# Patient Record
Sex: Female | Born: 1955 | Race: Black or African American | Hispanic: No | Marital: Single | Smoking: Current every day smoker
Health system: Southern US, Community
[De-identification: ages and names within clinical notes are randomized; demographics above are authoritative.]

## PROBLEM LIST (undated history)

## (undated) DIAGNOSIS — E785 Hyperlipidemia, unspecified: Secondary | ICD-10-CM

## (undated) DIAGNOSIS — J45909 Unspecified asthma, uncomplicated: Secondary | ICD-10-CM

## (undated) DIAGNOSIS — Z8042 Family history of malignant neoplasm of prostate: Secondary | ICD-10-CM

## (undated) DIAGNOSIS — I2510 Atherosclerotic heart disease of native coronary artery without angina pectoris: Secondary | ICD-10-CM

## (undated) DIAGNOSIS — Z8051 Family history of malignant neoplasm of kidney: Secondary | ICD-10-CM

## (undated) DIAGNOSIS — G4730 Sleep apnea, unspecified: Secondary | ICD-10-CM

## (undated) DIAGNOSIS — I10 Essential (primary) hypertension: Secondary | ICD-10-CM

## (undated) DIAGNOSIS — J449 Chronic obstructive pulmonary disease, unspecified: Secondary | ICD-10-CM

## (undated) DIAGNOSIS — E119 Type 2 diabetes mellitus without complications: Secondary | ICD-10-CM

## (undated) DIAGNOSIS — Z8041 Family history of malignant neoplasm of ovary: Secondary | ICD-10-CM

## (undated) DIAGNOSIS — I82409 Acute embolism and thrombosis of unspecified deep veins of unspecified lower extremity: Secondary | ICD-10-CM

## (undated) HISTORY — DX: Type 2 diabetes mellitus without complications: E11.9

## (undated) HISTORY — DX: Family history of malignant neoplasm of ovary: Z80.41

## (undated) HISTORY — DX: Family history of malignant neoplasm of prostate: Z80.42

## (undated) HISTORY — DX: Sleep apnea, unspecified: G47.30

## (undated) HISTORY — DX: Family history of malignant neoplasm of kidney: Z80.51

## (undated) HISTORY — DX: Hyperlipidemia, unspecified: E78.5

## (undated) HISTORY — PX: CARDIAC CATHETERIZATION: SHX172

## (undated) HISTORY — PX: TOTAL KNEE ARTHROPLASTY: SHX125

## (undated) HISTORY — PX: ABDOMINAL HYSTERECTOMY: SHX81

## (undated) HISTORY — DX: Acute embolism and thrombosis of unspecified deep veins of unspecified lower extremity: I82.409

## (undated) HISTORY — DX: Chronic obstructive pulmonary disease, unspecified: J44.9

## (undated) HISTORY — DX: Atherosclerotic heart disease of native coronary artery without angina pectoris: I25.10

## (undated) HISTORY — DX: Essential (primary) hypertension: I10

## (undated) HISTORY — PX: KNEE ARTHROSCOPY: SHX127

---

## 1997-10-17 ENCOUNTER — Emergency Department (HOSPITAL_COMMUNITY): Admission: EM | Admit: 1997-10-17 | Discharge: 1997-10-17 | Payer: Self-pay | Admitting: Emergency Medicine

## 2004-10-08 ENCOUNTER — Encounter: Admission: RE | Admit: 2004-10-08 | Discharge: 2004-10-08 | Payer: Self-pay | Admitting: Occupational Medicine

## 2004-11-03 ENCOUNTER — Encounter: Admission: RE | Admit: 2004-11-03 | Discharge: 2004-11-26 | Payer: Self-pay | Admitting: Orthopedic Surgery

## 2005-01-19 ENCOUNTER — Inpatient Hospital Stay (HOSPITAL_COMMUNITY): Admission: EM | Admit: 2005-01-19 | Discharge: 2005-01-22 | Payer: Self-pay | Admitting: Emergency Medicine

## 2005-01-25 ENCOUNTER — Ambulatory Visit: Payer: Self-pay | Admitting: Internal Medicine

## 2005-02-01 ENCOUNTER — Ambulatory Visit: Payer: Self-pay | Admitting: Internal Medicine

## 2005-02-08 ENCOUNTER — Ambulatory Visit: Payer: Self-pay | Admitting: Internal Medicine

## 2005-02-15 ENCOUNTER — Ambulatory Visit: Payer: Self-pay | Admitting: Internal Medicine

## 2005-02-22 ENCOUNTER — Ambulatory Visit: Payer: Self-pay | Admitting: Internal Medicine

## 2005-03-01 ENCOUNTER — Ambulatory Visit: Payer: Self-pay | Admitting: Internal Medicine

## 2005-03-08 ENCOUNTER — Ambulatory Visit: Payer: Self-pay | Admitting: Internal Medicine

## 2005-03-15 ENCOUNTER — Ambulatory Visit: Payer: Self-pay | Admitting: Internal Medicine

## 2005-03-25 ENCOUNTER — Ambulatory Visit: Payer: Self-pay | Admitting: Internal Medicine

## 2005-04-01 ENCOUNTER — Ambulatory Visit: Payer: Self-pay

## 2005-04-07 ENCOUNTER — Ambulatory Visit: Payer: Self-pay

## 2005-04-18 ENCOUNTER — Ambulatory Visit: Payer: Self-pay | Admitting: Internal Medicine

## 2005-05-19 ENCOUNTER — Ambulatory Visit: Payer: Self-pay | Admitting: Internal Medicine

## 2005-05-26 ENCOUNTER — Ambulatory Visit: Payer: Self-pay

## 2005-06-07 ENCOUNTER — Ambulatory Visit: Payer: Self-pay | Admitting: Internal Medicine

## 2005-06-28 ENCOUNTER — Ambulatory Visit: Payer: Self-pay | Admitting: Internal Medicine

## 2005-08-17 ENCOUNTER — Ambulatory Visit: Payer: Self-pay | Admitting: Internal Medicine

## 2005-12-16 ENCOUNTER — Ambulatory Visit: Payer: Self-pay | Admitting: Internal Medicine

## 2005-12-16 ENCOUNTER — Observation Stay (HOSPITAL_COMMUNITY): Admission: AD | Admit: 2005-12-16 | Discharge: 2005-12-17 | Payer: Self-pay | Admitting: Internal Medicine

## 2005-12-17 ENCOUNTER — Ambulatory Visit: Payer: Self-pay | Admitting: Internal Medicine

## 2005-12-21 ENCOUNTER — Ambulatory Visit: Payer: Self-pay | Admitting: Internal Medicine

## 2005-12-27 ENCOUNTER — Ambulatory Visit: Payer: Self-pay | Admitting: Cardiovascular Disease

## 2005-12-29 ENCOUNTER — Inpatient Hospital Stay (HOSPITAL_BASED_OUTPATIENT_CLINIC_OR_DEPARTMENT_OTHER): Admission: RE | Admit: 2005-12-29 | Discharge: 2005-12-29 | Payer: Self-pay | Admitting: Cardiovascular Disease

## 2005-12-29 ENCOUNTER — Ambulatory Visit: Payer: Self-pay | Admitting: Cardiovascular Disease

## 2006-01-05 ENCOUNTER — Ambulatory Visit: Payer: Self-pay | Admitting: Cardiovascular Disease

## 2006-01-19 ENCOUNTER — Ambulatory Visit: Payer: Self-pay | Admitting: Internal Medicine

## 2006-06-13 DIAGNOSIS — R945 Abnormal results of liver function studies: Secondary | ICD-10-CM

## 2006-06-13 DIAGNOSIS — M545 Low back pain: Secondary | ICD-10-CM

## 2006-06-13 DIAGNOSIS — M1990 Unspecified osteoarthritis, unspecified site: Secondary | ICD-10-CM | POA: Insufficient documentation

## 2006-06-13 DIAGNOSIS — F1910 Other psychoactive substance abuse, uncomplicated: Secondary | ICD-10-CM | POA: Insufficient documentation

## 2006-12-25 ENCOUNTER — Ambulatory Visit: Payer: Self-pay | Admitting: Internal Medicine

## 2006-12-25 DIAGNOSIS — E119 Type 2 diabetes mellitus without complications: Secondary | ICD-10-CM | POA: Insufficient documentation

## 2007-01-15 ENCOUNTER — Telehealth (INDEPENDENT_AMBULATORY_CARE_PROVIDER_SITE_OTHER): Payer: Self-pay | Admitting: *Deleted

## 2007-01-18 ENCOUNTER — Encounter: Payer: Self-pay | Admitting: Internal Medicine

## 2007-01-19 ENCOUNTER — Telehealth: Payer: Self-pay | Admitting: Internal Medicine

## 2007-04-13 ENCOUNTER — Ambulatory Visit: Payer: Self-pay | Admitting: Internal Medicine

## 2007-04-13 DIAGNOSIS — K219 Gastro-esophageal reflux disease without esophagitis: Secondary | ICD-10-CM

## 2007-04-13 DIAGNOSIS — J45909 Unspecified asthma, uncomplicated: Secondary | ICD-10-CM

## 2007-04-13 DIAGNOSIS — J439 Emphysema, unspecified: Secondary | ICD-10-CM | POA: Insufficient documentation

## 2007-04-13 DIAGNOSIS — E785 Hyperlipidemia, unspecified: Secondary | ICD-10-CM | POA: Insufficient documentation

## 2007-04-18 LAB — CONVERTED CEMR LAB
Albumin: 3.7 g/dL (ref 3.5–5.2)
Alkaline Phosphatase: 34 units/L — ABNORMAL LOW (ref 39–117)
BUN: 9 mg/dL (ref 6–23)
Basophils Relative: 0.7 % (ref 0.0–1.0)
CO2: 27 meq/L (ref 19–32)
Calcium: 9 mg/dL (ref 8.4–10.5)
Cholesterol: 221 mg/dL (ref 0–200)
Direct LDL: 165 mg/dL
GFR calc non Af Amer: 94 mL/min
HDL: 40.7 mg/dL (ref >39.0)
Hgb A1c MFr Bld: 6.1 % — ABNORMAL HIGH (ref 4.6–6.0)
MCHC: 34.4 g/dL (ref 30.0–36.0)
MCV: 94.1 fL (ref 78.0–100.0)
Microalb Creat Ratio: 4.2 mg/g (ref 0.0–30.0)
Microalb, Ur: 1 mg/dL (ref 0.0–1.9)
Monocytes Absolute: 0.3 10*3/uL (ref 0.2–0.7)
Monocytes Relative: 5.9 % (ref 3.0–11.0)
Neutro Abs: 2.5 10*3/uL (ref 1.4–7.7)
Neutrophils Relative %: 45.8 % (ref 43.0–77.0)
Platelets: 229 10*3/uL (ref 150–400)
Potassium: 3.6 meq/L (ref 3.5–5.1)
RDW: 13.7 % (ref 11.5–14.6)
Sodium: 138 meq/L (ref 135–145)
Total Protein: 6.7 g/dL (ref 6.0–8.3)
VLDL: 16 mg/dL (ref 0–40)

## 2007-05-07 ENCOUNTER — Emergency Department (HOSPITAL_COMMUNITY): Admission: EM | Admit: 2007-05-07 | Discharge: 2007-05-07 | Payer: Self-pay | Admitting: Emergency Medicine

## 2007-05-21 ENCOUNTER — Ambulatory Visit: Payer: Self-pay | Admitting: Family Medicine

## 2007-05-21 DIAGNOSIS — R42 Dizziness and giddiness: Secondary | ICD-10-CM

## 2007-05-21 LAB — CONVERTED CEMR LAB
Glucose, Bld: 237 mg/dL
Troponin I: 0.02 ng/mL (ref <0.06)

## 2007-05-22 ENCOUNTER — Telehealth (INDEPENDENT_AMBULATORY_CARE_PROVIDER_SITE_OTHER): Payer: Self-pay | Admitting: *Deleted

## 2007-05-22 ENCOUNTER — Encounter (INDEPENDENT_AMBULATORY_CARE_PROVIDER_SITE_OTHER): Payer: Self-pay | Admitting: *Deleted

## 2007-05-22 LAB — CONVERTED CEMR LAB
BUN: 14 mg/dL (ref 6–23)
Basophils Relative: 0.2 % (ref 0.0–1.0)
CK-MB: 1.2 ng/mL (ref 0.3–4.0)
Calcium: 9.2 mg/dL (ref 8.4–10.5)
Eosinophils Relative: 2.1 % (ref 0.0–5.0)
GFR calc non Af Amer: 70 mL/min
Glucose, Bld: 93 mg/dL (ref 70–99)
Lymphocytes Relative: 40.7 % (ref 12.0–46.0)
Monocytes Absolute: 0.5 10*3/uL (ref 0.2–0.7)
Neutro Abs: 4.2 10*3/uL (ref 1.4–7.7)
Neutrophils Relative %: 51.4 % (ref 43.0–77.0)
Platelets: 235 10*3/uL (ref 150–400)
Potassium: 3.5 meq/L (ref 3.5–5.1)
RBC: 4.51 M/uL (ref 3.87–5.11)
RDW: 12.5 % (ref 11.5–14.6)
Sodium: 141 meq/L (ref 135–145)
Total CK: 90 units/L (ref 7–177)
WBC: 8.2 10*3/uL (ref 4.5–10.5)

## 2007-05-23 ENCOUNTER — Ambulatory Visit: Payer: Self-pay | Admitting: Internal Medicine

## 2007-05-23 DIAGNOSIS — R079 Chest pain, unspecified: Secondary | ICD-10-CM

## 2007-05-28 LAB — CONVERTED CEMR LAB: Creatinine, Ser: 0.8 mg/dL

## 2007-07-23 ENCOUNTER — Ambulatory Visit: Payer: Self-pay | Admitting: Internal Medicine

## 2007-07-24 ENCOUNTER — Encounter: Payer: Self-pay | Admitting: Internal Medicine

## 2007-07-27 LAB — CONVERTED CEMR LAB
AST: 23 units/L (ref 0–37)
Cholesterol: 150 mg/dL (ref 0–200)
Hgb A1c MFr Bld: 6.1 % — ABNORMAL HIGH (ref 4.6–6.0)
LDL Cholesterol: 91 mg/dL (ref 0–99)
Total CHOL/HDL Ratio: 3.7
VLDL: 18 mg/dL (ref 0–40)

## 2007-07-30 ENCOUNTER — Encounter (INDEPENDENT_AMBULATORY_CARE_PROVIDER_SITE_OTHER): Payer: Self-pay | Admitting: *Deleted

## 2007-08-10 ENCOUNTER — Ambulatory Visit: Payer: Self-pay | Admitting: Gastroenterology

## 2007-08-22 ENCOUNTER — Encounter: Payer: Self-pay | Admitting: Internal Medicine

## 2007-08-22 ENCOUNTER — Ambulatory Visit: Payer: Self-pay | Admitting: Gastroenterology

## 2007-08-29 ENCOUNTER — Encounter: Admission: RE | Admit: 2007-08-29 | Discharge: 2007-09-06 | Payer: Self-pay | Admitting: Orthopedic Surgery

## 2007-09-04 ENCOUNTER — Inpatient Hospital Stay (HOSPITAL_COMMUNITY): Admission: RE | Admit: 2007-09-04 | Discharge: 2007-09-07 | Payer: Self-pay | Admitting: Orthopedic Surgery

## 2007-10-09 ENCOUNTER — Encounter: Admission: RE | Admit: 2007-10-09 | Discharge: 2007-12-05 | Payer: Self-pay | Admitting: Orthopedic Surgery

## 2007-11-26 ENCOUNTER — Inpatient Hospital Stay (HOSPITAL_COMMUNITY): Admission: RE | Admit: 2007-11-26 | Discharge: 2007-11-29 | Payer: Self-pay | Admitting: Orthopedic Surgery

## 2007-12-24 ENCOUNTER — Encounter: Admission: RE | Admit: 2007-12-24 | Discharge: 2008-02-18 | Payer: Self-pay | Admitting: Specialist

## 2008-05-29 ENCOUNTER — Telehealth (INDEPENDENT_AMBULATORY_CARE_PROVIDER_SITE_OTHER): Payer: Self-pay | Admitting: *Deleted

## 2008-05-30 ENCOUNTER — Encounter: Payer: Self-pay | Admitting: Internal Medicine

## 2008-06-23 ENCOUNTER — Encounter: Admission: RE | Admit: 2008-06-23 | Discharge: 2008-08-12 | Payer: Self-pay | Admitting: Orthopedic Surgery

## 2008-07-08 ENCOUNTER — Encounter (INDEPENDENT_AMBULATORY_CARE_PROVIDER_SITE_OTHER): Payer: Self-pay | Admitting: *Deleted

## 2009-06-30 ENCOUNTER — Ambulatory Visit (HOSPITAL_BASED_OUTPATIENT_CLINIC_OR_DEPARTMENT_OTHER): Admission: RE | Admit: 2009-06-30 | Discharge: 2009-06-30 | Payer: Self-pay | Admitting: Internal Medicine

## 2009-06-30 ENCOUNTER — Ambulatory Visit: Payer: Self-pay | Admitting: Diagnostic Radiology

## 2010-05-30 ENCOUNTER — Encounter: Payer: Self-pay | Admitting: Internal Medicine

## 2010-09-21 NOTE — H&P (Signed)
NAMEJODEE, [name]   MEDICAL RECORD NO.:  [id number]          PATIENT TYPE:  INP   LOCATION:                               FACILITY:  [facility]   PHYSICIAN:  Mary Frankel. Charlann [name], M.D.  DATE OF BIRTH:  09-15-1955   DATE OF ADMISSION:  09/04/2007  DATE OF DISCHARGE:                              HISTORY & PHYSICAL   OPERATION/PROCEDURE:  Right total knee arthroplasty.   CHIEF COMPLAINTS:  Right knee pain.   HISTORY OF PRESENT ILLNESS:  A 55 year old female with a history of  right knee pain secondary to osteoarthritis.  She has been refractory to  all conservative treatment including oral anti-inflammatories and  cortisone injections.  She also has a significant history of spontaneous  DVT in 2007 without previous surgery.  Since then she has been on some  Coumadin, recently been transitioned off aspirin.  She had been  presurgically assessed by Dr. Drue [name] for a right total knee replacement.   PAST MEDICAL HISTORY:  1. Osteoarthritis.  2. Anxiety/depression.  3. Asthma.  4. Hypercholesterolemia.  5. Reflux disease.  6. Hepatitis, unknown what type.  7. Diabetes.  8. History of DVT in 2007.  9. Degenerative disk disease.   PAST SURGERIES:  1. Hysterectomy in 1999.  2. Arthroscopic surgery of her knees in 1990 and 1996.   FAMILY HISTORY:  Heart attack, thyroid disease, multiple family members  with blood clots.   SOCIAL HISTORY:  The patient is single, has a partner who will help her  with caregiving after surgery.   ALLERGIES:  NO KNOWN DRUG ALLERGIES.   CURRENT MEDICATIONS:  1. Fentanyl 50 mcg q.72 h.  2. Cymbalta 60 mg p.o. daily.  3. Vicodin 5/500 up to three times daily.  4. Robaxin 500 mg one p.o. up to three times a day.  5. Celebrex 200 mg p.o. daily.  At time of surgery will be taking 200      mg p.o. b.i.d. and then resume back to one a day.  6. Crestor 20 mg one p.o. daily.  7. Prevacid 30 mg one p.o. b.i.d.  8. Janumet 5/500  two times a day.   REVIEW OF SYSTEMS:  NEUROLOGY/ENT:  She does have a history of addiction  and drug abuse, is on a pain contract with Dr. Ethelene [name] who has cleared her  use of pain medicines at time of surgery.  CARDIOVASCULAR:  Does have a  history of phlebitis.  GENITOURINARY:  Has some urinary incontinence  issues and had kidney stones before.  Otherwise see HPI.   PHYSICAL EXAMINATION:  VITAL SIGNS:  Pulse 72, respirations 18, blood  pressure 140/80.  GENERAL:  Awake, alert and oriented, well-developed, well-nourished, no  acute distress.  Does use a single point cane.  NECK:  Supple.  No carotid bruits.  CHEST/LUNGS:  Clear to auscultation bilaterally.  BREASTS:  Deferred.  HEART:  Regular rate and rhythm.  S1 and S2 distinct.  ABDOMEN:  Soft, nontender, bowel sounds present.  GENITOURINARY:  Deferred.  EXTREMITIES:  Right knee slight varus deformity.  She comes out to full  extension.  Dorsalis pedis pulse positive.  SKIN:  No cellulitis.  NEUROLOGIC:  Intact distal sensibilities.   LABORATORY DATA:  Labs, EKG, chest x-ray all pending presurgical  testing.   IMPRESSION:  Right knee osteoarthritis.   PLAN:  Right total knee arthroplasty at [facility], September 04, 2007, by surgeon Dr. Durene [name].  Risks and complications were  discussed.  Questions were encouraged, answered and reviewed.   Postoperative medications were provided.  Dr. Drue [name] has requested that she  be placed on Coumadin postoperatively for DVT prophylaxis.  She does  have a history of DVT.  I will review this in her surgery clearance as  well as with her at time of stay in the hospital.  At minimal we will  use Lovenox for bridging if we do not fully utilize Lovenox for DVT  prophylaxis plus aspirin.     ______________________________  Mary Glassman. Loreta [name], [place]      Mary Frankel. Charlann [name], M.D.  Electronically Signed    BLM/MEDQ  D:  08/23/2007  T:  08/23/2007  Job:  [id number]   cc:   Mary Ora, MD   [phone] [place], [place] [address]   [name]. [name], M.D.  Fax: [phone]

## 2010-09-21 NOTE — Discharge Summary (Signed)
[name], [name]   MEDICAL RECORD NO.:  [id number]          PATIENT TYPE:  INP   LOCATION:  1614                         FACILITY:  [facility]   PHYSICIAN:  Madlyn Frankel. Charlann Boxer, M.D.  DATE OF BIRTH:  1956/03/28   DATE OF ADMISSION:  09/04/2007  DATE OF DISCHARGE:  09/07/2007                               DISCHARGE SUMMARY   ADMITTING DIAGNOSES:  1. Osteoarthritis.  2. Anxiety/depression.  3. Asthma.  4. Hypercholesteremia.  5. Reflux disease.  6. Hepatitis, unknown type.  7. Diabetes.  8. History of DVT in 2007.  9. Degenerative disk disease.   DISCHARGE DIAGNOSES:  1. Osteoarthritis.  2. Anxiety/depression.  3. Asthma.  4. Hypercholesteremia.  5. Reflux disease.  6. Hepatitis, unknown type.  7. Diabetes.  8. History of DVT in 2007.  9 . Degenerative disk disease.   HISTORY OF PRESENT ILLNESS:  A 55 year old female with a history of  right knee pain secondary to osteoarthritis.  Refractory to all  conservative treatment.  Has a history of spontaneous DVT in 2007  without previous surgery.  She had been on some Coumadin and  transitioned to aspirin.  Her primary care physician, Dr. Drue Novel.   CONSULTATION:  None.   PROCEDURE:  Right total knee arthroplasty by surgeon Dr. Durene Romans.  Assistant [employer] PA-C.   LABORATORY DATA:  Preadmission CBC:  Hemoglobin 12.8, hematocrit 37.7,  platelets 165.  At time of discharge, hemoglobin 9.8, hematocrit 28.4,  platelets 135.  White cell differential normal.  Coagulation all within  normal limits.  Routine chemistry on admission:  Sodium 139, potassium  3.6, glucose 115, creatinine 0.68.  At time of discharge, 138 sodium,  3.8 potassium, glucose 119, creatinine 0.62.  Calcium was 8.2.  UA was  negative.   CARDIOLOGY:  EKG normal sinus rhythm.   RADIOLOGY:  No chest x-ray found on chart.  She did have a presurgical  clearance by [facility] prior to surgery.   HOSPITAL COURSE:  The  patient underwent right total knee replacement and  tolerated procedure well and was admitted to the orthopedic floor.  She  remained hemodynamically stable throughout course of stay.  We pulled  Hemovac on day #1, had some oozing from the portal, dressing was  reinforced.  She was neurovascularly intact throughout her course of  stay, was able to do straight leg raise after day #1.  She was  weightbearing as tolerated.  Made excellent progress with physical  therapy.  She remained afebrile with no complications during her rehab  in the hospital.  On day #3, we had had her on Lovenox for  anticoagulation.  However, she wanted to be on Coumadin when she was  discharged home, so we started her on Coumadin day #3 with some Lovenox  bridge for 3 days until therapeutic with INR greater than 1.6.  Otherwise, she was stable and ready for discharge with home health care  PT.   DISCHARGE DISPOSITION:  Discharged home with home health care PT.   CONDITION ON DISCHARGE:  She is stable in improved condition.   DISCHARGE  INSTRUCTIONS:  1. Discharge diet:  Regular diabetic.  2. Discharge wound care:  Keep wound dry.  3. Discharge physical therapy:  Weightbearing as tolerated with the      use of rolling walker.   DISCHARGE MEDICATIONS:  1. Coumadin 5 mg 2 tablets daily at 6:00 p.m. starting on May 2.  2. Lovenox 40 mg subcu q. 24 times 3 days until INR therapeutic,      greater than 1.8.  3. Robaxin 500 mg one p.o. q. 6.  4. Vicodin 5/325 one to two p.o. q. 4-6 p.r.n. pain.  5. Iron 325 mg one p.o. t.i.d. x2 weeks.  6. Colace 100 mg p.o. b.i.d.  7. MiraLax 70 grams p.o. daily.  8. Cymbalta 60 mg p.o. q.a.m.  9. Fentanyl patch 50 mcg q.72 h.  10.Celebrex 200 mg p.o. b.i.d. x2 weeks.  11.Prevacid 30 mg p.o. b.i.d.  12.Crestor 20 mg p.o. every evening.  13.Janumet 50/500 twice daily.  14.Aspirin 325 mg, hold until complete with Coumadin times 6 weeks.  15.Multivitamin daily.  __________.    DISCHARGE FOLLOWUP:  Follow with Dr. Charlann Boxer at phone number [phone] in 10-  14 days.    ______________________________  [name] [name], [place]      Madlyn Frankel. Charlann Boxer, M.D.  Electronically Signed   BLM/MEDQ  D:  10/29/2007  T:  10/29/2007  Job:  [id number]

## 2010-09-21 NOTE — Op Note (Signed)
NAMELOUCILE, POSNER             ACCOUNT NO.:  [id number]   MEDICAL RECORD NO.:  [id number]          PATIENT TYPE:  INP   LOCATION:  0003                         FACILITY:  [facility]   PHYSICIAN:  Madlyn Frankel. Charlann Boxer, M.D.  DATE OF BIRTH:  Aug 19, 1955   DATE OF PROCEDURE:  09/04/2007  DATE OF DISCHARGE:                               OPERATIVE REPORT   PREOPERATIVE DIAGNOSIS:  Right knee osteoarthritis.   POSTOPERATIVE DIAGNOSIS:  Right knee osteoarthritis.   PROCEDURE:  Right total hip right total knee replacement.   COMPONENTS USED:  DePuy rotating platform posterior stabilized knee  system; with a size 4 narrow femur, size 3 tibial tray, a 12.5 insert  and a 38 patellar button.   SURGEON:  Madlyn Frankel. Charlann Boxer, M.D.   ASSISTANT:  Yetta Glassman. Mann, PA   ANESTHESIA:  Dermal with spinal.   TOURNIQUET TIME:  43 minutes at 250 mmHg.   DRAINS:  Times one.   COMPLICATIONS:  None.   INDICATIONS FOR PROCEDURE:  Ms. Bansal is a 55 year old female who I  had been following in the office for advanced bilateral knee  osteoarthritis.  She had at this point failed conservative measures and  at this point wished to proceed with knee replacement surgery.  We  reviewed the extensive risks of infection, DVT, component failure, need  for revision surgery at her age; as well as the postoperative course and  expectations due to her size and pain level.  Consent was obtained for a  total knee replacement for the benefit of pain relief.  Consent was  obtained.   PROCEDURE IN DETAIL:  The patient was brought to the operative theater.  Once adequate anesthesia and preoperative antibiotics, with Ancef  administered, the patient was positioned supine with a proximal thigh  tourniquet placed.  The right lower extremity was then prescrubbed and  prepped and draped in a sterile fashion.  A midline incision was made,  followed by a median arthrotomy.  Following the initial debridement,  attention was  directed to the patella.  Precut measurement was 24 mm.  I  resected down to 13-14 mm and used a 38 patellar button.  I placed a  metal shim to protect the cut surface of the patella from the  retractors.   Following the partial meniscectomy and cruciate stump debridement, I  attended to the femur.  The femoral canal was opened, with an anterior  starting hole and irrigating the canal to prevent fat emboli.  I then  passed the intramedullary rod and at 5 degrees of valgus resected 10 mm  of bone off the distal femur.  I sized the femur to be a size 4 in the  anterior-posterior dimension; however it was more narrowed for a female.  It was for this reason I chose to use the 4 narrow.  I went ahead and  based off the posterior condylar axis, pinned the block in place (which  matched the perpendicular the [employer]).  The anterior, posterior  and chamfer cuts were then made.  Following this, I made a box cut based  off the lateral  aspect of the distal femur with the size 3 box cut.   Following this, attention was now directed to the tibia.  The tibia was  subluxated anteriorly.  The remaining meniscus and cruciate stumps were  debrided.  I resected 10 mm of bone off the proximal lateral tibia at  the high side.   Once I made this cut, I checked the cut surface; after debriding the  osteophytes off the medial and proximal tibia, in addition to the medial  and distal femur osteophytes.   The cut surfaces of the tibia would best fit with size 3.  This allowed  for proper orientation without impingement posterior or lateral.  The  tendon was blocked into position and checked for alignment of the rod,  and it was perpendicular in both planes.  I went ahead and drilled and  keel punched this tray, and did a trial reduction with the 4 narrow  femur, the 3 tibia and a 12.5 insert.  The knee was stable from  extension to flexion with full extension.  The patella tracked without  application  of any pressure.  At this point, all trial components were  removed.  I drilled some smooth pin holes into the sclerotic bone on the  proximal tibia and distal femur.  I then injected the synovial capsule  layer with 60 mL of 0.25% Marcaine with epinephrine, and 1 mL of  Toradol.  The knee was irrigated with normal saline solution as the  final components were opened, and then cement mixed.  The components  were cemented into position, with the knee brought up to extension with  the 12.5 insert.  Extruded cement was removed.   Once the cement had been removed and cured, the knee was brought to  flexion and trial component removed.  Once this cement was finished, I  debrided the remaining cement.  A medium Hemovac drain was placed deep.  I reirrigated the knee at this point.  We then used the #2 Quill suture  to reapproximate the extensor mechanism in flexion.  I then used 2-0  Vicryl in the subcutaneous layer, and a running 4-0 Monocryl.  The knee  was cleaned, dried and dressed sterilely with a sterile bulky Jones  dressing.  She was brought to the recovery room in stable condition,  tolerating the procedure well.      Madlyn Frankel Charlann Boxer, M.D.  Electronically Signed     MDO/MEDQ  D:  09/04/2007  T:  09/04/2007  Job:  [id number]

## 2010-09-21 NOTE — H&P (Signed)
[name], [name]             ACCOUNT NO.:  [id number]   MEDICAL RECORD NO.:  [id number]         PATIENT TYPE:  LINP   LOCATION:                               FACILITY:  [facility]   PHYSICIAN:  [name], M.D.  DATE OF BIRTH:  Apr 22, 1956   DATE OF ADMISSION:  11/26/2007  DATE OF DISCHARGE:                              HISTORY & PHYSICAL   PROCEDURE:  Left total knee arthroplasty.   CHIEF COMPLAINT:  Left knee pain.   HISTORY OF PRESENT ILLNESS:  This 55 year old female with a history of  left knee pain secondary to osteoarthritis.  It was not refractory to  all conservative treatment including oral anti-inflammatories and  cortisone injections.  She does have a recent history of a right total  knee replacement in April of 2009 by [name] and she has done extremely  well with this.  She had previously been assessed by [name] for knee  replacement surgery and was cleared.  In addition, she did have a  history of spontaneous DVT in 2007, had previously been on Coumadin, and  did utilize Coumadin after her first surgery for the right knee.   PAST MEDICAL HISTORY:  Past medical history is significant for:  1. Osteoarthritis.  2. Anxiety and depression.  3. Asthma.  4. Hypercholesteremia.  5. Reflux disease.  6. Hepatitis, unknown what type.  7. Diabetes.  8. History of DVT in 2007.  9. Degenerative disk disease.   PAST SURGERIES:  1. Hysterectomy 1999.  2. Arthroscopic surgery of her knees in 1990 and 1996 and right total      knee replacement in April of 2009.   FAMILY HISTORY:  1. Heart attack.  2. Thyroid disease.  3. Multiple family members with blood clots.   SOCIAL HISTORY:  The patient is single.  Her primary caregiver after  surgery was her mother after last surgery.   ALLERGIES:  NO KNOWN DRUG ALLERGIES.   MEDICATIONS:  1. Atenolol 50 mg p.o. daily.  2. Fentanyl 50 mcg q.72 hours.  3. Cymbalta 60 mg p.o. daily.  4. Vicodin 5/500 up to three times  daily.  5. Robaxin 500 mg 1 p.o. up to three times daily.  6. Celebrex 200 mg p.o. daily.  At the time of surgery she will be      taking 200 mg p.o. b.i.d. and then resume back to one a day after 2      weeks.  7. Crestor 20 mg 1 p.o. daily.  8. Prevacid 30 mg one p.o. b.i.d.  9. Janumet 5/500 two times a day.   REVIEW OF SYSTEMS:  GENERAL: She has night sweats and fatigue.  HEENT:  __________  she has some issues with headaches and blurred vision,  dizziness, some insomnia, imbalance problems.  She does wear dentures.  RESPIRATORY: She has shortness of breath at rest and shortness of breath  on exertion with some wheezing.  CARDIOVASCULAR: She is noted some  increased swelling.  GI: She has had jaundice at the time as well as  heartburn.  GENITOURINARY: She has some painful urination and increased  urination at night.  MUSCULOSKELETAL:  Multiple joint pains, multiple  musculoskeletal pains with spasms.  NEUROLOGY:  She does have a history  of addiction and drug abuse and is on a pain contract with [name] who  has cleared for use of pain medicines at time of surgery which we did  with the right knee in the past, otherwise see HPI.   PHYSICAL EXAMINATION:  Pulse 72, respirations 16, blood pressure 138/76.  GENERAL:  Awake, alert and oriented, well-developed, well-nourished, in  no acute distress.  NECK: Supple.  No carotid bruits.  CHEST/LUNGS:  Clear to auscultation bilaterally.  BREASTS:  Deferred.  HEART:  Regular rate and rhythm.  S1-S2 distinct.  ABDOMEN:  Soft, nontender, bowel sounds present.  GENITOURINARY:  Deferred.  EXTREMITIES:  The left knee has slight varus deformity.  She does come  out to full extension.  Dorsalis pedis pulse positive.  SKIN:  No cellulitis.  NEUROLOGIC:  Intact distal sensibilities.   LABORATORY DATA:  Labs, EKG, chest x-ray all pending presurgical  testing.   IMPRESSION:  Left knee osteoarthritis.   PLAN:  Left total knee arthroplasty at  [facility], November 26, 2007 by Surgeon Dr. Durene Romans.  Risks and complications were  discussed.   Postoperative medications were provided which included Robaxin, iron,  Colace and MiraLax.  Pain medicine will be provided at the time of  surgery.  We are planning on the use of Coumadin for DVT prophylaxis  postoperatively.     ______________________________  [name], [place]      [name], M.D.  Electronically Signed    BLM/MEDQ  D:  11/19/2007  T:  11/19/2007  Job:  [id number]   cc:   Mary Ora, MD  [phone] [address] [place], [place] [address]

## 2010-09-21 NOTE — Assessment & Plan Note (Signed)
[facility]                                 ON-CALL NOTE   NAME:WILLIAMSEnza, Shone                      MRN:          [id number]  DATE:05/21/2007                            DOB:          12-23-1955    PATIENT:  [name].   DATE AND TIME:  May 21, 2007, at 5:26 p.m.   DATE OF BIRTH:  11/18/1955.   PHONE:  Is [phone].   CALLER:  Angie from [facility].   PRIMARY CARE PHYSICIAN:  Dr. Leanne Chang.   TELEPHONE CALL:  The call is about stat labs.  She had a troponin drawn  with the result being negative at 0.02, which is within normal range  with no evidence of myocardial injury.  This lab did get posted for Dr.  Blossom Hoops to read in the morning.     Marne A. Tower, MD  Electronically Signed    MAT/MedQ  DD: 05/21/2007  DT: 05/22/2007  Job #: [id number]

## 2010-09-21 NOTE — Op Note (Signed)
NAMENORETTA, FRIER             ACCOUNT NO.:  [id number]   MEDICAL RECORD NO.:  [id number]          PATIENT TYPE:  INP   LOCATION:  0004                         FACILITY:  [facility]   PHYSICIAN:  Madlyn Frankel. Charlann Boxer, M.D.  DATE OF BIRTH:  09/26/1955   DATE OF PROCEDURE:  11/26/2007  DATE OF DISCHARGE:                               OPERATIVE REPORT   PREOPERATIVE DIAGNOSES:  1. Left knee end-stage osteoarthritis.  2. History of right total knee replacement.   POSTOPERATIVE DIAGNOSIS:  1. Left knee end-stage osteoarthritis.  2. History of right total knee replacement.   PROCEDURE:  Left total knee replacement.   COMPONENTS USED:  DePuy rotating platform posterior stabilized knee  system, size 3 femur, 3 tibia, 12.5 insert and a 38 patellar button.   SURGEON:  Madlyn Frankel. Charlann Boxer, M.D.   ASSISTANT:  [name], PA   ANESTHESIA:  Duramorph, spinal.   COMPLICATIONS:  None.   DRAINS:  Times one.   TOURNIQUET TIME:  45 minutes at 300 mmHg.   INDICATION FOR PROCEDURE:  Ms. Mcquaig is  55 year old female patient  with a history of right total knee replacement and bilateral end-stage  degenerative changes.  She has done very well with the right knee and  was ready to have the left one done.  We reviewed the risks and benefits  of this procedure, and hospital course, expectation.  Consent was  obtained.   PROCEDURE IN DETAIL:  The patient was brought to the operating theater.  Once adequate anesthesia, preoperative antibiotics, 2 g of Ancef,  administered.  The patient was positioned supine, thigh tourniquet  placed.  The left lower extremity was pre scrubbed and prepped and  draped in sterile fashion.  The leg was exsanguinated, tourniquet  elevated.  The midline incision was made and followed by an arthrotomy.  Following initial debridement, I attended to the patella first.  The  precut measurement was 24 mm.  I resected down to 14 mm and used a 38  patellar button, as I had  done on the contralateral knee.  Lug holes  were drilled.  The left of the trial component was placed to protect the  cut surface of the patella from retractors, as well as the saw blades.   At this point, following further debridement, I was able sublux the  tibia anteriorly.  With this I attended now to the femur.  The femoral  canal was opened with a drill, irrigated to prevent fat emboli.  I then  passed the intramedullary rod and resected 11 mm of bone off the distal  femur.  There was a slight bit of osteophyte medially, justifying the 11  mm cut.   I set this at 5 degrees of the valgus.   The anterior of this distal cut was made and I sized the femur at this  point to be a size 3.  It was actually between a size 3 and 4, but I  chose the size 3 on this side, as it seemed closer to a size 3.  Then I  attended to the tibia.  The  tibia was subluxated anteriorly.  With the  extramedullary rod, perpendicular shaft with the tibia, I resected 8 mm  bone off the proximal lateral tibia.  Following this bone cut, I checked  the cut to make sure it was perpendicular in both planes, and then made  some adjustments, removing osteophytes and shaving down some bone as  necessary to make sure the cut was perpendicular.   With this cut made, now I attended back to the femur.  The cut surface  of tibia was going to help with the rotation of femoral component.   With the 5 degree rod in place, I placed the anterior stylus on the  anterior shaft of the tibia and locked it in place.  I reset the  rotation based on the proximal patellar and the tibia, using the  appropriate shim.  Once the rotation was held in position, drill holes  were removed.  I then placed a size 3 cutting block.  I checked with a  crab claw and found that there was going to be no notching.  At this  point the anterior, posterior and chamfer cuts were all made without  difficulty and no notching.   I went ahead and made the  final box cut off the lateral aspect of the  distal femur.  The final tibial preparation was now carried out with  tibia  subluxated anteriorly.  I held the size 3 tibial tray in place,  pinned it, drilled and did the keel punch.  Trial reduction was carried  out briefly with the tibia and a 10 mm insert initially, and then a  12.5.  With the 10 mm insert site, I thought there was a slight bit of  hyperextension, and thus I used the 12.5.  The ligaments appeared to be  very stable from extension to flexion with the patella tracking without  application of any pressure.   I then loosened bone osteophytes off the posterior, medial and lateral  aspect of the femur.  At this point all trial components were removed.  We had drilled some holes into the lateral and proximal tibia, irrigated  the wound with normal saline solution, pulse lavage, removing any debris  as necessary.  The synovial capsular junction was then injected with  0.75% Marcaine with epinephrine and 1 mL of Toradol for a total of 60  mL.  Cement was mixed and the final components were opened.   The final components were cemented into position.  The knee was brought  to extension with a 12.5 insert.  Once extruded cement was removed, the  remaining cement cured.  I removed the remaining cement.  Once I was  satisfied that I was unable to visualize any other cement within the  soft tissues, a final size 3 x 12.5 mm insert was inserted.  The knee  was reduced, irrigated again with pulse lavage with medium Hemovac drain  placed.  Thigh tourniquet was let down.  The extensor mechanism was then  reapproximated using #1 Vicryl with the knee in flexion.  The remainder  of the wound was closed in layers of 2-0 Vicryl running and with 4-0  Monocryl.  The wound was cleaned, dried and dressed sterilely with Steri-  Strips and a bulky sterile dressing.  She was brought to the recovery  room in stable condition, tolerating the procedure  well.     Madlyn Frankel Charlann Boxer, M.D.  Electronically Signed    MDO/MEDQ  D:  11/26/2007  T:  11/26/2007  Job:  [id number]

## 2010-09-21 NOTE — Discharge Summary (Signed)
NAMEJAQUELYNE, FIRKUS             ACCOUNT NO.:  [id number]   MEDICAL RECORD NO.:  [id number]          PATIENT TYPE:  INP   LOCATION:  1619                         FACILITY:  [facility]   PHYSICIAN:  Madlyn Frankel. Charlann Boxer, M.D.  DATE OF BIRTH:  Aug 10, 1955   DATE OF ADMISSION:  11/26/2007  DATE OF DISCHARGE:  11/29/2007                               DISCHARGE SUMMARY   ADMISSION DIAGNOSES:  1. Osteoarthritis.  2. Anxiety/depression.  3. Asthma.  4. Hypercholesteremia.  5. Reflux.  6. Hepatitis.  7. Diabetes.  8. History of DVT in 2007.  9. Degenerative disk disease.   DISCHARGE DIAGNOSES:  1. Osteoarthritis.  2. Anxiety/depression.  3. Asthma.  4. Hypercholesteremia.  5. Reflux disease.  6. Hepatitis.  7. Diabetes.  8. History of DVT in 2007.  9. Degenerative disk disease.   HISTORY OF PRESENT ILLNESS:  A 55 year old female with a history of left  knee pain secondary to osteoarthritis was refractory to all conservative  treatment including oral anti-inflammatories and cortisone injection.  She does have a history of right total knee replacement in April 2009,  by Dr. Charlann Boxer and did well.   CONSULTATION:  None.   PROCEDURE:  A left total knee arthroplasty by surgeon, Dr. Durene Romans.  Assistant, Dwyane Luo PA-C.   CARDIOLOGY:  Previous EKG showed normal sinus rhythm.   RADIOLOGY:  No chest x-ray found on chart.   LABORATORY DATA:  CBC upon admission all normal.  At time of discharge,  hemoglobin 10.3, hematocrit 30.4, platelets 168.  White cell  differential no significant abnormalities.  Coagulation INR 1 at time of  discharge.  Routine chemistry upon admission all within normal limits.  At time of discharge, sodium 138, potassium 3.6, glucose 100, creatinine  0.66.  Kidney function normal, GFR calcium was 8.3 discharge.  UA was  negative.   HOSPITAL COURSE:  The patient underwent left total knee replacement and  admitted to orthopedic floor.  She remained hemodynamically  stable,  afebrile throughout her course of stay.  Hemovac was discontinued on day  #1, dressing was changed, no significant drainage or sign of infection  from the wound at any point in time during her course of stay.  Physical  therapy, occupational therapy.  She was weightbearing as tolerated.  Met  all functional criteria for discharge.  Home health care PT prior to  discharge.  Seen on day #3 and was doing well, ready for discharge home.   DISCHARGE DISPOSITION:  Discharged home with home health care PT,  stable, improved condition.   DISCHARGE DIET:  Regular as tolerated by the patient.  The patient is  diabetic.   DISCHARGE WOUND CARE:  Keep wound dry.   DISCHARGE PHYSICAL THERAPY:  Weightbearing as tolerated with use of a  rolling walker.   DISCHARGE MEDICATIONS:  1. Lovenox 40 mg subcu q.24 h x11 days.  2. Robaxin 500 mg p.o. q.6 h.  3. Enteric-coated aspirin 325 mg p.o. daily.  4. Vicodin 5/325 one-two p.o. q.4-6 h p.r.n. pain.  5. Iron 325 mg one p.o. t.i.d. x1 week.  6. Colace 100 mg p.o.  b.i.d.  7. MiraLax 17 grams p.o. daily.  8. Celebrex 200 mg one p.o. daily.  9. Cymbalta 60 mg p.o. daily.  10.Crestor 20 mg p.o. daily.  11.Prevacid 30 mg p.o. b.i.d.  12.Janumet twice daily.  13.Fentanyl 32 pack 50 mcg patch q.72 h.  14.Atenolol 50 mg p.o. daily.   DISCHARGE FOLLOW UP:  Follow up with Dr. Charlann Boxer at phone number [phone]  in 2 weeks for wound check.     ______________________________  [name]. Loreta Ave, [place]      Madlyn Frankel. Charlann Boxer, M.D.  Electronically Signed    BLM/MEDQ  D:  12/25/2007  T:  12/25/2007  Job:  [id number]   cc:   [name], MD  [phone] [address] [place], [place] [address]

## 2010-09-24 NOTE — Letter (Signed)
January 05, 2006     [name], M.D.  [phone] [place], [place] [address]   RE:  Mary, [name]  MRN:  [id number]  /  DOB:  01/03/1956   Dear Dr. Drue Novel,   It was my pleasure to see [name] in followup after her cardiac  catheterization in the cardiology clinic this morning.  As you know, she is  a very pleasant 55 year old African-American woman who presented with  ongoing chest pain and indeterminate noninvasive imaging study.  Due to her  ongoing symptoms and difficulty with performing noninvasive studies  secondary to her body habitus, I elected to perform a cardiac  catheterization.  Study was done on December 29, 2005 and demonstrated left  ventricular function and mild nonobstructive coronary artery disease.   She returns for followup today and has been doing well.  She reports no new  complaints at this time.   On physical examination today weight is 291 pounds, blood pressure is 128/80  and heart rate is 86.   Her exam shows a normal right brachial pulse.  Her right radial pulse is  diminished at 1+ and there is some local ecchymosis in this region.  The  hand is warm and appears well perfused.  Her right groin site was examined  as well and there is no ecchymosis or hematoma in this region.  There is no  tenderness there.  Her distal pulses in the feet are intact.   ASSESSMENT:  1. Nonobstructive coronary artery disease.  2. Type 2 diabetes currently managed with diet.  3. History of hyperlipidemia.  4. Tobacco abuse.   I have reviewed her cath findings with her and discussed the importance of  risk factor modification.  We discussed smoking cessation at length. She has  been unable to qualify for payment of Chantix but will continue to try to  stop smoking.  Regarding her lipid status, I was unable to find a copy of  her lipids but she reports taking Lipitor in the past and discontinuing this  secondary  to difficulty paying for the medication.  I will  leave her lipid  therapy to your discretion but would recommend an LDL goal of less than 100  in her case.   [name] will resume her  regular followup with you.  I want to thank  you again for the opportunity to evaluate her.  Please feel free to contact  me at any time with questions regarding her care.    Sincerely,      Micheline Chapman, MD   MDC/MedQ  DD:  01/05/2006  DT:  01/05/2006  Job #:  [id number]

## 2010-09-24 NOTE — Assessment & Plan Note (Signed)
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   CHIEF COMPLAINT:  Chest pain and tightness.   HISTORY OF PRESENT ILLNESS:  [name] is a very pleasant 55 year old  woman who presents with ongoing intermittent chest discomfort.  She reports  a two month history of intermittent chest tightness that generally starts  abruptly and waxes and wanes over a number of minutes.  Her chest tightness  is unrelated to exertion.  She was evaluated earlier this month and admitted  to [facility] where she was worked up for a possible pulmonary  embolus and her CT angiogram was negative.  This was done because of her  symptoms as well as her history of deep vein thrombosis last year.  She also  had some chest pain last year when she was evaluated with adenosine Myoview  study. This was performed on April 01, 2005 and was interpreted as a  probably negative stress nuclear study.  There was some difficulty in  interpreting this because of severe breath attenuation.  She was thought to  have a small degree of inferior scarring as well.   In addition to chest tightness the patient complains of dyspnea on exertion.  She denies syncope, palpitations, orthopnea, PND or other cardiovascular  complaints.   PAST MEDICAL HISTORY:  Pertinent for the following:  1. DVT diagnosed one year ago.  She completed treatment with Warfarin and      had a recent chest CT that was negative for pulmonary embolus.  Of note      she does have a very strong family history of deep venous thromboses.  2. Type 2 diabetes currently diet controlled.  3. Osteoarthritis of the knees.  4. History of  increased LFTs in the absence of hepatitis B or C.  5. History of work-related back injury one year ago.   SOCIAL HISTORY:  The patient lives alone.  She works as a Lawyer.  She does  smoke cigarettes approximately 1/2 pack per day over the past 40 years.  She  does not drink alcohol or use recreational drugs since 1999.   FAMILY HISTORY:  Is pertinent for her father who died of a myocardial  infarction at age 62.  Her mother had a myocardial infarction at age 67.  She does have four siblings who have no coronary artery disease.   CURRENT MEDICATIONS:  1. Hydrocodone 500 mg 3x daily.  2. Celebrex 200 mg twice daily.  3. Prilosec 20 mg daily.  4. Aspirin 325 mg daily.  5. She takes Methocarbamol as needed for back spasms.   ALLERGIES:  No known drug allergies.   REVIEW OF SYSTEMS:  Pertinent for the following:  1. Chronic leg pain since her back injury in May of 2006.  2. Gastroesophageal reflux.  3. Osteoarthritis.  4. Fatigue.  5. Constipation.  6. Gastric ulcer.  7. Hiatal hernia.   All other systems were reviewed in detail as a complete 12-point review of  systems was completed.  Symptoms were negative except as described above.   PHYSICAL EXAMINATION:  GENERAL:  The patient is alert and oriented.  She is  an obese African-American female in no acute distress.  VITAL SIGNS:  Her height is 5 feet, 4 inches.  Weight is 294 pounds.  Her  initial blood pressure was 135/94 in the left arm.  I rechecked blood  pressures in the left forearm due to her upper arm size and the left arm  blood pressure was 122/88 and the right arm blood pressure was 118/87.  Her  heart rate is 87.  Respiratory rate is 12.  EYES:  Pupils are equal, round and reactive to light.  Sclerae anicteric,  conjunctiva pink.  ENT:  Moist oral mucosa.  Oropharynx is clear.  NECK:  There is no cervical lymphadenopathy.  LUNGS:  Clear to auscultation bilaterally.  CARDIOVASCULAR:  Jugular venous pressure is  normal.  Carotid upstrokes are  normal without bruits.  The apex is not palpable.  The heart is regular rate  and rhythm without murmurs or gallops.  ABDOMEN:  Soft, obese, non-tender.  No organomegaly, no abdominal bruits are  appreciated.  EXTREMITIES:  There is no clubbing or cyanosis.  There is trace pretibial  edema bilaterally.  Peripheral pulses are 2+ and equal throughout.  An  Allen's test was performed on both hands and this was normal bilaterally.  NEUROLOGIC:  Grossly intact with 5/5 motor strength bilaterally.  SKIN:  Warm and dry without rash.   EKG:  Shows normal sinus rhythm with left axis deviation but no ST or T wave  abnormalities are noted.   ASSESSMENT/PLAN:  1. This is a 55 year old African-American woman presenting with chest      pain.  Differential diagnosis includes myocardial ischemia versus      gastrointestinal etiology as the most likely diagnoses.  Pulmonary      embolus has been ruled out by computerized tomography pulmonary      angiogram.  Her pain does not sound musculoskeletal.  She has had an      evaluation in November of last year where an adenosine myocardial      profusion scan was performed but her symptoms are progressive.  I think      her body habitus limits the utility of further non-invasive testing as      it would be unlikely that she has reasonable echo windows and she had      severe breast attenuation on her previous nuclear study.  Therefore, we      discussed performing a cardiac catheterization which I think is      appropriate in this case to rule out significant obstructive coronary      artery disease.  The patient has ongoing chest discomfort and has      significant risk factors of obesity, diabetes and strong family      history.  I would favor a radial artery approach for arterial access      due to her obesity and she does have a normal Allen's test suggesting     good collateral blood flow to her hands.  We will schedule  this for      either later this week or early next week at the  patient's convenience.      I have explained the risks and indications and she is in agreement.      She will continue on daily aspirin.                                 Micheline Chapman, MD    MDC/MedQ  DD:  12/27/2005  DT:  12/28/2005  Job #:  [id number]

## 2010-09-24 NOTE — H&P (Signed)
[name]   MEDICAL RECORD NO.:  [id number]          PATIENT TYPE:  INP   LOCATION:  0101                         FACILITY:  [facility]   PHYSICIAN:  Hillery Aldo, M.D.   DATE OF BIRTH:  27-Apr-1956   DATE OF ADMISSION:  01/19/2005  DATE OF DISCHARGE:                                HISTORY & PHYSICAL   The patient is unassigned.   CHIEF COMPLAINT:  Left lower extremity swelling and pain.   HISTORY OF PRESENT ILLNESS:  The patient is 55 year old female with a past  medical history of diet-controlled diabetes who developed a back strain  while working on September __________  2006.  The patient states she was  attempting to lift a patient and developed an acute onset of lower back pain  which subsequently began to swell in the back region and subsequently also  down the left leg.  The patient originally saw Dr. Shelle Iron who had radiographs  done and treated her symptomatically.  She was scheduled to have an epidural  injection done, on January 20, 2005, for ongoing problems with pain.  The  patient also reports some shortness of breath that also came on with the  back pain.  Overall, the patient states that the lower extremity swelling  and her shortness of breath have actually improved some.  Dr. Shelle Iron advised  her to come to the emergency department for further evaluation and workup.  Upon initial evaluation in the emergency department, she was found to have  an elevated D-dimer.  She subsequently underwent CT scanning of the chest  which did not reveal any pulmonary embolism.  She does, however, have a left  lower extremity DVT.  She is admitted for initiation of anticoagulation and  further evaluation and workup.   PAST MEDICAL HISTORY:  1.  Diabetes times 3-4 years.  The patient states she maintains a diabetic      diet and exercises to control it.  2.  Hysterectomy, 1999, secondary to fibroids.  3.  History of bilateral  arthroscopic knee surgery.   FAMILY HISTORY:  The patient's mother is alive at age 44 and has had a  pulmonary embolism.  She also has hypertension and thyroid disease.  She had  an acute MI some years ago.  The patient's father is deceased at age 9  secondary to a massive heart attack.  He also was diabetic.  She has one  brother who had had DVT at age 62. Other siblings are healthy.   SOCIAL HISTORY:  The patient is single and lives in [place] with her  family.  She works as a Lawyer.  She smokes less than one pack of tobacco daily  and has done so for approximately 40 years.  She denies any alcohol or drug  use.  She has two sons that are healthy.   ALLERGIES:  No known drug allergies.   MEDICATIONS:  1.  Vicodin 5/500 one to two p.o. q.6h. p.r.n.  2.  Celebrex 200 mg b.i.d.  3.  Skelaxin 800 mg p.o. t.i.d. p.r.n.   REVIEW OF SYSTEMS:  The patient denies any fever or chills.  She has had  some steady weight gain over time.  Denies any cough.  She does report  intermittent one second intervals of chest pain that are transient and do  not bother her at all.  She has some chronic constipation.  No nausea or  vomiting.  No melena or hematochezia.  Lower back pain as described per HPI.  No dysuria.  Reports that other than the back pain, her energy level is  adequate.   PHYSICAL EXAMINATION:  VITAL SIGNS:  Temperature 98.3, pulse 95,  respirations 22, blood pressure 127/54, O2 saturation 98% on room air.  GENERAL:  Obese female in no acute distress.  HEENT: Normocephalic, atraumatic. PERRL. EOMI. Oropharynx is clear.  NECK:  Supple.  No thyromegaly, no lymphadenopathy, no jugular venous  distension.  CHEST:  Lungs clear to auscultation bilaterally with good air movement.  HEART:  Regular rate and rhythm.  No murmurs, rubs, or gallops.  ABDOMEN:  Soft, nontender, nondistended with normoactive bowel sounds.  RECTAL:  Normal sphincter tone.  No stool in the vault.  Effluent is heme   negative.  EXTREMITIES:  The patient has some left lower extremity swelling.  Dorsalis  pedis pulses are 2+ bilaterally.  SKIN:  Warm and dry.  No rashes.  NEUROLOGIC:  The patient is alert and oriented x3.  Nonfocal.   LABORATORY DATA:  Hemoglobin is 12.7, hematocrit 37.2, white blood cell  count 5, platelets 230.  Sodium is 137, potassium 3.9, chloride 108,  bicarbonate 24, BUN 11, creatinine 0.9, glucose 136.  LFTs were within  normal limits.  PT was 13.4 and PTT 26. D-dimer was elevated 2.97.  A CT  scan of the chest and lower extremities reveals no PE, but there was a DVT  visible.   IMPRESSION AND PLAN:  1.  Deep vein thrombosis.  Given the patient's family history, I think it is      reasonable to admit her for initiation of therapy with Lovenox and      Coumadin.  She has already received a dose of Lovenox in the emergency      department which will make diagnostic testing for an underlying pro      coagulable disorder difficult.  I will, however, test for lupus      anticoagulant, factor V Leiden, and antiphospholipid antibody.  She did      have a precipitating event with back trauma, although the nature of the      trauma does not suggest a good correlation for the development of her      deep vein thrombosis. We will have pharmacy dose her Lovenox and      Coumadin.  She will likely need six months therapy and possibly      indefinite therapy.  2.  Diabetes: Will check the patient's hemoglobin A1c to see how her overall      glycemic control is.  Additionally, I will check her sugars a.c. and      h.s.  If she does need medication, Metformin would likely be a good      starting medication, given her obesity.  3.  Obesity.  We will obtain nutritional and diabetic education.  4.  Back pain.  We will continue the patient's Vicodin and Skelaxin p.r.n.           ______________________________  Hillery Aldo, M.D.    CR/MEDQ  D:  01/19/2005  T:  01/19/2005  Job:  [id number]   cc:   Jene Every, M.D.  [address]  [place]  [place] [address]  Fax: [phone]

## 2010-09-24 NOTE — Discharge Summary (Signed)
[name], [name]   MEDICAL RECORD NO.:  [id number]          PATIENT TYPE:  INP   LOCATION:  1412                         FACILITY:  [facility]   PHYSICIAN:  [name], M.D.DATE OF BIRTH:  [birth year]   DATE OF ADMISSION:  01/19/2005  DATE OF DISCHARGE:  01/22/2005                                 DISCHARGE SUMMARY   PRIMARY CARE PHYSICIAN:  Unassigned.   FINAL DIAGNOSES:  1.  Left lower extremity deep venous thrombosis.  2.  Diet controlled type 2 diabetes.  3.  Chronic back pain.  4.  Morbid obesity.  5.  Smoking.   CONSULTATIONS:  None.   OPERATION:  None.   ALLERGIES:  None.   CODE STATUS:  Full.   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 She fell after an  acute onset of lower back pain which has made her fairly immobile and she  ended up getting swelling in the back and down her left leg. This eventually  got worse. She saw Dr. Shelle Iron for her orthopedic problems and was scheduled  to have an epidural on September 14, however, she instead was told to come  to the emergency department for evaluation of her leg and had a left lower  extremity DVT. CT scanning in the chest did not show pulmonary emboli.  Interestingly, her mother has had a pulmonary embolus and she had a brother  who had a DVT at age 51. She smokes less than one pack per day for the last  4 years.   PHYSICAL EXAMINATION:  Obesity and swelling in the left lower extremity with  calf tenderness.   HOSPITAL COURSE:  The patient was started on Lovenox and Coumadin.  Hypercoagulable workup was negative. Her A1c was 6.1 indicating good  diabetic control. She was continued on her Vicodin and Skelaxin and Celebrex  with, however, the addition of PPI for some ulcer prevention.   DISPOSITION:  The patient will be discharged on Lovenox 200 mg daily for 5  days. Coumadin 10 mg, she  is to take 1.5 pills a day for 2 days and then  once a day. She needs to be seen by the Coumadin clinic on Monday or Tuesday  at the latest at which time her dosages could be readjusted. She will  continue her Vicodin, Celebrex,  Skelaxin, will add on omeprazole 20 mg daily. So far she had received  Coumadin 10 mg, 10 mg and 15 mg and her INR had gone from 1.1 to 1.3. I have  also asked her to arrange to get a primary care physician since she does not  have one at present.      [name], M.D.  Electronically Signed     JLB/MEDQ  D:  01/22/2005  T:  01/24/2005  Job:  [id number]   cc:   [facility]

## 2010-09-24 NOTE — Discharge Summary (Signed)
NAMEMARYEM, [name]             ACCOUNT NO.:  [id number]   MEDICAL RECORD NO.:  [id number]          PATIENT TYPE:  INP   LOCATION:  4705                         FACILITY:  [facility]   PHYSICIAN:  Titus Dubin. Alwyn Ren, M.D. [facility] OF BIRTH:  11/20/1955   DATE OF ADMISSION:  12/16/2005  DATE OF DISCHARGE:  12/17/2005                                 DISCHARGE SUMMARY   ADMITTING DIAGNOSES:  1. Chest pain.  2. Exertional dyspnea.  3. Back and leg pain.   DISCHARGE DIAGNOSES:  1. Chest pain essentially resolved; negative CT angiogram for embolus.  2. Chronic back and leg pain status post epidural and status post vascular      injections.   BRIEF HISTORY:  [name] is a 55 year old African-American female who  presented to Dr. Drue Novel December 16, 2005 with swelling in the legs and shortness  of breath for several weeks.  Despite the complaint of shortness of breath,  her O2 sats on room air were 99% and vital signs were stable.  She  complained of chest pressure which was constant, increased with walking,  like someone sitting there.  She also described chronic shortness of  breath and exertional dyspnea which had been worse with dyspnea after a half-  a-block or less.  She had noted edema for three months.   She is followed for chronic pains syndrome by Dr. Ethelene Hal.  She had an  epidural injection, she believes, in March of this year with no subsequent  benefit.  In June, a facet injection was employed.  She states that this  pushed my pain into my legs and causing them to be weaker and actually  increased the pain.   Significantly, she has a history of deep vein thrombosis in September of  2006.   She was admitted to rule out pulmonary emboli.   CPK and troponins were negative as was EKG.  Her D-dimer was mildly elevated  at 0.70 with normals less than 0.48.  Her hematocrit was 39.2 with a normal  white count.  Electrolytes were normal except for the glucose of 129.  Specifically,  her CPK ranged from 151 to 153 and troponins 0.01 x3.   The morning of discharge, she stated that the chest pain was essentially  resolved.  Her major concern was the lower back, posterior thigh and pain in  the calves, particularly in the left.  She stated that she had some  shortness or breath even with walking to the bathroom.   O2 sats were 99% on room air.  Temperature was 97.4, pulse 69 and regular,  respiratory rate 20 and unlabored, blood pressure was 127/82.  She was in no  acute distress, chest was clear without rhonchi, rales or rubs.  An S4 was  noted.  She had no significant edema.   This EKG and CT were discussed with her.   I did not feel comfortable discharging her with the leg pain despite the  fact that she stated this was a chronic pain for which Dr. Ethelene Hal had been  following her.  My main concern was her previous history  of deep venous  thrombosis in September of 2006 and her mildly elevated D-dimer.Although the  D-dimer is nonspecific, this did concern me.   I arranged for a venous Doppler prior to discharge and this was completed  and was normal.   Her hospital course was uncomplicated.  During the hospitalization, she  refused nitroglycerin.  She also refused Toradol.  She stated that because  of the history of substance abuse, she has contracted with Dr. Ethelene Hal and can  only take Vicodin prescribed by him.  She states that she does not want to  be perceived as a drug seeker.  At the time of discharge, she stated that  she had Vicodin on hand by Dr. Ethelene Hal and he will continue his treatment of  her chronic pain syndrome.   She was in no acute distress and with negative CT angiogram and venous  Doppler, she was discharged home.  There will be no changes in her home  medications.   The evaluation the morning of discharge to include review of her past  history involving the deep venous thrombosis and her chronic pain history  and its treatment encompassed 22  minutes.  This also included arranging the  venous Doppler.   I returned and dictated a chart for a total time expenditure of 32 minutes.  This is being recorded as per insurance mandates.   Her discharge status is improved.  Prognosis depends on her response to the  chronic pain.  She will be followed by Drs. Ramos and Paz as an outpatient.      Titus Dubin. Alwyn Ren, M.D. [facility]  Electronically Signed     WFH/MEDQ  D:  12/17/2005  T:  12/17/2005  Job:  [id number]   cc:   Wanda Plump, MD [facility]

## 2010-09-24 NOTE — Cardiovascular Report (Signed)
NAMEJANALYN, [name]             ACCOUNT NO.:  [id number]   MEDICAL RECORD NO.:  [id number]          PATIENT TYPE:  OIB   LOCATION:  1999                         FACILITY:  [facility]   PHYSICIAN:  Veverly Fells. Excell Seltzer, MD  DATE OF BIRTH:  [birth year]   DATE OF PROCEDURE:  12/29/2005  DATE OF DISCHARGE:  12/29/2005                              CARDIAC CATHETERIZATION   INDICATION:  Ms. Burgert is a very pleasant 55 year old woman who I  evaluated in the clinic earlier this week regarding persistent substernal  chest pain and exertional dyspnea.  Patient underwent a nuclear myocardial  perfusion study in November of 2006 that was very difficult to interpret due  to the patient's body habitus and severe breast attenuation.  The study  overall was thought to be negative but there was a small degree of inferior  scarring seen and the patient has persistent symptoms.  Therefore, I elected  to proceed with a cardiac catheterization to definitively evaluate her  coronary anatomy.   PROCEDURE:  1. Left heart catheterization.  2. Coronary angiogram.  3. Left ventricular angiogram.  4. Right subclavian angiogram.  5. AngioSeal of the right femoral artery.   PROCEDURAL DETAILS:  Informed consent was obtained from the patient.  She  was brought to the catheterization laboratory and her right wrist was  prepped and draped in normal sterile fashion.  Using the modified Seldinger  technique and a micro puncture kit the right radial artery was accessed with  a 5-French sheath without problems.  This access site was chosen due to the  patient's large body habitus.  I was able to reach her subclavian aortic  junction but there was extreme tortuosity of the right subclavian artery and  I was unable to pass a catheter into the aorta or the coronary arteries from  this approach.  I attempted with a Wholey wire and Amplatz super stiff wire  and 5 and 4-French JR-4 catheters.  Regardless of the approach  taken, I was  unable to get a catheter to track down to the aorta.  Therefore, I had a  discussion with the patient and we opted to move to the right femoral artery  site for access.  She was agreeable to this.   Using normal sterile technique the right groin was prepped and draped in  normal sterile fashion.  Arterial access was achieved using the modified  Seldinger technique and a 5-French arterial sheath was placed.  Catheters  used included a 5-French angled pigtail, JL-4, and no-torque right catheter.  All catheter exchanges were performed over a guidewire.  At the conclusion  of the procedure a femoral angiogram demonstrated the access site to be in  the common femoral artery and a 6-French AngioSeal was placed for closure.   FINDINGS:  Opening aortic pressure was 133/88 with a mean of 108.  Left  ventricular pressure was 135/8 with an end-diastolic pressure of 13.   Left coronary artery:  The left main stem is normal.  It bifurcates into the  left LAD and left circumflex.  The proximal LAD has a 20% stenosis.  It  gives off two small sized diagonal branches both of which have diffuse  luminal irregularities.  The mid LAD has another 20% stenosis and the  remainder of the LAD is free of disease.   Left circumflex:  The left circumflex is a large vessel.  It gives off two  large obtuse marginal vessels and continues down the AV groove to give off a  small posterolateral branch.  The proximal circumflex has 20% stenosis.  There is no other angiographic disease visible in the left circumflex  system.   Right coronary artery is a dominant vessel.  There are proximal luminal  irregularities and in the mid vessel there is a 30% stenosis.  Distally, it  branches into a PDA and a posterior AV segment.  The posterior AV segment  has a 40% stenosis in its ostium.  The PDA has a 20% stenosis at its ostium.  There is no other disease appreciated in the right coronary artery.   Left  ventriculogram was performed in the 30-degree RAO projection.  Demonstrated low normal left ventricular function with an estimated left  ventricular ejection fraction of 50-55%.   ASSESSMENT:  1. Non-obstructive coronary artery disease.  2. Normal left ventricular function.   PLAN:  Will continue with medical therapy for the patient's coronary artery  disease, hypertension, and diabetes.  There is no percutaneous intervention  indicated as her disease is mild.      Veverly Fells. Excell Seltzer, MD  Electronically Signed     MDC/MEDQ  D:  12/29/2005  T:  12/29/2005  Job:  [id number]   cc:   [name], MD

## 2010-09-24 NOTE — H&P (Signed)
[name], [name]             ACCOUNT NO.:  [id number]   MEDICAL RECORD NO.:  [id number]          PATIENT TYPE:  INP   LOCATION:  4705                         FACILITY:  [facility]   PHYSICIAN:  [name], MD           DATE OF BIRTH:  22-Aug-1955   DATE OF ADMISSION:  12/16/2005  DATE OF DISCHARGE:                                HISTORY & PHYSICAL   CHIEF COMPLAINT:  Leg swelling and shortness of breath.   HISTORY OF PRESENT ILLNESS:  [name] is a 55 year old black female with  a history of DVT who presented to the office with several complaints for the  last three weeks.  She states that she has developed chest pain at the mid  anterior chest described as pressure almost constant and worse when she  walks.  At some point during the conversation, she states that it feels  like somebody sitting there.  In addition to that, her chronic shortness of  breath and dyspnea on exertion is worse, basically she developed shortness  of breath after less than a half block of walking.  She continues with  chronic edema in the lower extremities which is ongoing for several months.   PAST MEDICAL HISTORY:  1. Diabetes with diet control.  2. History of increased LFTs with negative hepatitis B and C workup.  3. Osteoarthritis of the knees.  4. DVT diagnosed in September 2006 status post Coumadin for six months.  5. Partial hysterectomy.  6. History of substance abuse in the past, clean for the last nine years.  7. History of a negative stress test in November 2006.   FAMILY HISTORY:  Mother and two brothers have a history of DVTs.  One of his  brother also had a PE.  Father has diabetes and he had a heart attack.  Mother also has hypertension and a heart attack.  No history of colon or  breast cancer.   SOCIAL HISTORY:  The patient continues to smoke.  He does not drink.   REVIEW OF SYMPTOMS:  She denies any fevers.  She admits to a chronic cough  which is dry.  No wheezing.  No nausea,  vomiting, or diarrhea.  Her pain  doctor discontinued the Celebrex for three weeks to see if that would help  with the edema and it did.   MEDICATIONS:  1. Celebrex 200 mg one p.o. b.i.d.  2. Omeprazole 20 one p.o. daily.  3. Robaxin.   ALLERGIES:  1. ULTRACET caused itching.  2. CODEINE caused itching.   PHYSICAL EXAMINATION:  GENERAL:  The patient is alert and oriented in no apparent distress.  VITAL SIGNS:  Blood pressure 120/70, respirations 20, pulse 86, she weighs  290 pounds.  LUNGS:  Decreased breath sounds but clear.  CARDIOVASCULAR:  Regular rate and rhythm without a murmur.  ABDOMEN:  Nondistended, soft, good bowel sounds, no organomegaly.  EXTREMITIES:  Trace edema.  NEUROLOGICAL:  Her speech, gait, and motor are intact.   LABORATORY AND X-RAY DATA:  EKG showed no acute changes.   ASSESSMENT AND PLAN:  Ms.  [name] is a 55 year old lady who is admitted  with chest pain that is worse with exertion, increased dyspnea, in the  context of a strong family history of coagulation problems and a personal  history of a previous DVT.  In addition to that, she is a diabetic and  continues to smoke.  At this point, she will be admitted to telemetry to  rule out for an acute coronary syndrome with enzymes and EKGs.  Will also  rule out PE with CT of the chest.  Will consult cardiology.      [name], MD  Electronically Signed     JP/MEDQ  D:  12/16/2005  T:  12/16/2005  Job:  [id number]

## 2010-11-03 ENCOUNTER — Encounter: Payer: Medicare Other | Attending: Internal Medicine

## 2011-02-01 LAB — BASIC METABOLIC PANEL
CO2: 28
Calcium: 8.2 — ABNORMAL LOW
Calcium: 8.6
Creatinine, Ser: 0.68
Creatinine, Ser: 0.77
GFR calc Af Amer: >60
GFR calc Af Amer: >60
GFR calc Af Amer: >60
GFR calc non Af Amer: >60
GFR calc non Af Amer: >60
Glucose, Bld: 115 — ABNORMAL HIGH
Glucose, Bld: 119 — ABNORMAL HIGH
Glucose, Bld: 129 — ABNORMAL HIGH
Potassium: 3.8
Potassium: 3.8

## 2011-02-01 LAB — URINALYSIS, ROUTINE W REFLEX MICROSCOPIC
Bilirubin Urine: NEGATIVE
Glucose, UA: NEGATIVE
Hgb urine dipstick: NEGATIVE
Specific Gravity, Urine: 1.02
pH: 6.5

## 2011-02-01 LAB — CBC
HCT: 28.4 — ABNORMAL LOW
HCT: 29.8 — ABNORMAL LOW
HCT: 37.7
Hemoglobin: 10.2 — ABNORMAL LOW
Hemoglobin: 12.8
MCHC: 34.2
MCV: 91.7
MCV: 92.1
Platelets: 135 — ABNORMAL LOW
Platelets: 145 — ABNORMAL LOW
Platelets: 165
RBC: 3.1 — ABNORMAL LOW
RDW: 13.1
WBC: 5

## 2011-02-01 LAB — APTT: aPTT: 26

## 2011-02-01 LAB — DIFFERENTIAL
Lymphocytes Relative: 34
Lymphs Abs: 1
Monocytes Relative: 5
Neutrophils Relative %: 59

## 2011-02-01 LAB — ABO/RH: ABO/RH(D): A POS

## 2011-02-01 LAB — PROTIME-INR: INR: 1

## 2011-02-01 LAB — TYPE AND SCREEN
ABO/RH(D): A POS
Antibody Screen: NEGATIVE

## 2011-02-03 LAB — CBC
HCT: 36.7
MCHC: 33.3
MCV: 91.5
Platelets: 179
RBC: 4.01
WBC: 4.7

## 2011-02-03 LAB — DIFFERENTIAL
Basophils Relative: 2 — ABNORMAL HIGH
Eosinophils Absolute: 0.1
Eosinophils Relative: 3
Lymphs Abs: 1.7
Monocytes Relative: 7
Neutrophils Relative %: 52

## 2011-02-03 LAB — BASIC METABOLIC PANEL
BUN: 7
CO2: 29
Chloride: 102
Creatinine, Ser: 0.68
Potassium: 3.9

## 2011-02-03 LAB — URINALYSIS, ROUTINE W REFLEX MICROSCOPIC
Bilirubin Urine: NEGATIVE
Glucose, UA: NEGATIVE
Hgb urine dipstick: NEGATIVE
Specific Gravity, Urine: 1.027

## 2011-02-03 LAB — PROTIME-INR: INR: 1

## 2011-02-04 LAB — CBC
HCT: 30.4 — ABNORMAL LOW
HCT: 30.6 — ABNORMAL LOW
MCHC: 33.8
MCV: 91.5
Platelets: 156
Platelets: 168
RBC: 3.35 — ABNORMAL LOW
RDW: 13.1
WBC: 4.8
WBC: 5.1

## 2011-02-04 LAB — PROTIME-INR
INR: 1
Prothrombin Time: 13.6

## 2011-02-04 LAB — BASIC METABOLIC PANEL
BUN: 8
BUN: 9
CO2: 29
Chloride: 109
Creatinine, Ser: 0.66
Creatinine, Ser: 0.72
GFR calc Af Amer: >60
GFR calc non Af Amer: >60
Glucose, Bld: 135 — ABNORMAL HIGH
Potassium: 3.6
Potassium: 4.1

## 2011-02-04 LAB — GLUCOSE, CAPILLARY
Glucose-Capillary: 101 — ABNORMAL HIGH
Glucose-Capillary: 111 — ABNORMAL HIGH
Glucose-Capillary: 114 — ABNORMAL HIGH
Glucose-Capillary: 86
Glucose-Capillary: 96
Glucose-Capillary: 99

## 2011-02-04 LAB — TYPE AND SCREEN
ABO/RH(D): A POS
Antibody Screen: NEGATIVE

## 2014-02-11 ENCOUNTER — Ambulatory Visit: Payer: Self-pay | Admitting: Podiatry

## 2014-02-12 ENCOUNTER — Ambulatory Visit: Payer: Self-pay | Admitting: Podiatry

## 2014-02-26 ENCOUNTER — Ambulatory Visit: Payer: Self-pay | Admitting: Podiatry

## 2014-05-14 DIAGNOSIS — I2699 Other pulmonary embolism without acute cor pulmonale: Secondary | ICD-10-CM | POA: Diagnosis not present

## 2014-05-14 DIAGNOSIS — Z7901 Long term (current) use of anticoagulants: Secondary | ICD-10-CM | POA: Diagnosis not present

## 2014-05-16 DIAGNOSIS — M5136 Other intervertebral disc degeneration, lumbar region: Secondary | ICD-10-CM | POA: Diagnosis not present

## 2014-05-16 DIAGNOSIS — M5442 Lumbago with sciatica, left side: Secondary | ICD-10-CM | POA: Diagnosis not present

## 2014-06-06 DIAGNOSIS — M549 Dorsalgia, unspecified: Secondary | ICD-10-CM | POA: Diagnosis not present

## 2014-06-06 DIAGNOSIS — R040 Epistaxis: Secondary | ICD-10-CM | POA: Diagnosis not present

## 2014-06-06 DIAGNOSIS — Z7901 Long term (current) use of anticoagulants: Secondary | ICD-10-CM | POA: Diagnosis not present

## 2014-06-06 DIAGNOSIS — E119 Type 2 diabetes mellitus without complications: Secondary | ICD-10-CM | POA: Diagnosis not present

## 2014-06-06 DIAGNOSIS — I10 Essential (primary) hypertension: Secondary | ICD-10-CM | POA: Diagnosis not present

## 2014-06-06 DIAGNOSIS — R05 Cough: Secondary | ICD-10-CM | POA: Diagnosis not present

## 2014-06-06 DIAGNOSIS — Z86711 Personal history of pulmonary embolism: Secondary | ICD-10-CM | POA: Diagnosis not present

## 2014-06-06 DIAGNOSIS — Z86718 Personal history of other venous thrombosis and embolism: Secondary | ICD-10-CM | POA: Diagnosis not present

## 2014-06-06 DIAGNOSIS — I252 Old myocardial infarction: Secondary | ICD-10-CM | POA: Diagnosis not present

## 2014-06-06 DIAGNOSIS — E780 Pure hypercholesterolemia: Secondary | ICD-10-CM | POA: Diagnosis not present

## 2014-06-12 DIAGNOSIS — R0602 Shortness of breath: Secondary | ICD-10-CM | POA: Diagnosis not present

## 2014-06-12 DIAGNOSIS — I10 Essential (primary) hypertension: Secondary | ICD-10-CM | POA: Diagnosis not present

## 2014-06-12 DIAGNOSIS — R05 Cough: Secondary | ICD-10-CM | POA: Diagnosis not present

## 2014-06-12 DIAGNOSIS — E1140 Type 2 diabetes mellitus with diabetic neuropathy, unspecified: Secondary | ICD-10-CM | POA: Diagnosis not present

## 2014-06-12 DIAGNOSIS — E785 Hyperlipidemia, unspecified: Secondary | ICD-10-CM | POA: Diagnosis not present

## 2014-06-16 DIAGNOSIS — I2699 Other pulmonary embolism without acute cor pulmonale: Secondary | ICD-10-CM | POA: Diagnosis not present

## 2014-06-16 DIAGNOSIS — Z5181 Encounter for therapeutic drug level monitoring: Secondary | ICD-10-CM | POA: Diagnosis not present

## 2014-06-16 DIAGNOSIS — Z7901 Long term (current) use of anticoagulants: Secondary | ICD-10-CM | POA: Diagnosis not present

## 2014-06-26 DIAGNOSIS — E1140 Type 2 diabetes mellitus with diabetic neuropathy, unspecified: Secondary | ICD-10-CM | POA: Diagnosis not present

## 2014-06-26 DIAGNOSIS — E785 Hyperlipidemia, unspecified: Secondary | ICD-10-CM | POA: Diagnosis not present

## 2014-06-26 DIAGNOSIS — Z720 Tobacco use: Secondary | ICD-10-CM | POA: Diagnosis not present

## 2014-06-26 DIAGNOSIS — I10 Essential (primary) hypertension: Secondary | ICD-10-CM | POA: Diagnosis not present

## 2014-06-26 DIAGNOSIS — J449 Chronic obstructive pulmonary disease, unspecified: Secondary | ICD-10-CM | POA: Diagnosis not present

## 2014-08-06 DIAGNOSIS — Z5181 Encounter for therapeutic drug level monitoring: Secondary | ICD-10-CM | POA: Diagnosis not present

## 2014-08-06 DIAGNOSIS — Z7901 Long term (current) use of anticoagulants: Secondary | ICD-10-CM | POA: Diagnosis not present

## 2014-08-06 DIAGNOSIS — I2699 Other pulmonary embolism without acute cor pulmonale: Secondary | ICD-10-CM | POA: Diagnosis not present

## 2014-08-11 DIAGNOSIS — M5442 Lumbago with sciatica, left side: Secondary | ICD-10-CM | POA: Diagnosis not present

## 2014-08-11 DIAGNOSIS — Z79891 Long term (current) use of opiate analgesic: Secondary | ICD-10-CM | POA: Diagnosis not present

## 2014-08-11 DIAGNOSIS — M4310 Spondylolisthesis, site unspecified: Secondary | ICD-10-CM | POA: Diagnosis not present

## 2014-08-11 DIAGNOSIS — G894 Chronic pain syndrome: Secondary | ICD-10-CM | POA: Diagnosis not present

## 2014-08-29 DIAGNOSIS — Z5181 Encounter for therapeutic drug level monitoring: Secondary | ICD-10-CM | POA: Diagnosis not present

## 2014-08-29 DIAGNOSIS — Z7901 Long term (current) use of anticoagulants: Secondary | ICD-10-CM | POA: Diagnosis not present

## 2014-08-29 DIAGNOSIS — I2699 Other pulmonary embolism without acute cor pulmonale: Secondary | ICD-10-CM | POA: Diagnosis not present

## 2014-09-03 DIAGNOSIS — G894 Chronic pain syndrome: Secondary | ICD-10-CM | POA: Diagnosis not present

## 2014-09-03 DIAGNOSIS — M5136 Other intervertebral disc degeneration, lumbar region: Secondary | ICD-10-CM | POA: Diagnosis not present

## 2014-09-03 DIAGNOSIS — M4310 Spondylolisthesis, site unspecified: Secondary | ICD-10-CM | POA: Diagnosis not present

## 2014-09-03 DIAGNOSIS — M25552 Pain in left hip: Secondary | ICD-10-CM | POA: Diagnosis not present

## 2014-09-03 DIAGNOSIS — M5442 Lumbago with sciatica, left side: Secondary | ICD-10-CM | POA: Diagnosis not present

## 2014-09-18 DIAGNOSIS — M25552 Pain in left hip: Secondary | ICD-10-CM | POA: Diagnosis not present

## 2014-09-18 DIAGNOSIS — M1612 Unilateral primary osteoarthritis, left hip: Secondary | ICD-10-CM | POA: Diagnosis not present

## 2014-09-23 DIAGNOSIS — I2699 Other pulmonary embolism without acute cor pulmonale: Secondary | ICD-10-CM | POA: Diagnosis not present

## 2014-09-23 DIAGNOSIS — Z5181 Encounter for therapeutic drug level monitoring: Secondary | ICD-10-CM | POA: Diagnosis not present

## 2014-09-23 DIAGNOSIS — Z7901 Long term (current) use of anticoagulants: Secondary | ICD-10-CM | POA: Diagnosis not present

## 2014-10-01 DIAGNOSIS — M1612 Unilateral primary osteoarthritis, left hip: Secondary | ICD-10-CM | POA: Diagnosis not present

## 2014-10-22 DIAGNOSIS — R791 Abnormal coagulation profile: Secondary | ICD-10-CM | POA: Diagnosis not present

## 2014-10-22 DIAGNOSIS — I2699 Other pulmonary embolism without acute cor pulmonale: Secondary | ICD-10-CM | POA: Diagnosis not present

## 2014-10-22 DIAGNOSIS — Z7901 Long term (current) use of anticoagulants: Secondary | ICD-10-CM | POA: Diagnosis not present

## 2014-11-12 DIAGNOSIS — M5136 Other intervertebral disc degeneration, lumbar region: Secondary | ICD-10-CM | POA: Diagnosis not present

## 2014-11-12 DIAGNOSIS — M1612 Unilateral primary osteoarthritis, left hip: Secondary | ICD-10-CM | POA: Diagnosis not present

## 2014-11-12 DIAGNOSIS — G894 Chronic pain syndrome: Secondary | ICD-10-CM | POA: Diagnosis not present

## 2014-11-12 DIAGNOSIS — M5442 Lumbago with sciatica, left side: Secondary | ICD-10-CM | POA: Diagnosis not present

## 2014-11-24 DIAGNOSIS — I2699 Other pulmonary embolism without acute cor pulmonale: Secondary | ICD-10-CM | POA: Diagnosis not present

## 2014-11-24 DIAGNOSIS — Z7901 Long term (current) use of anticoagulants: Secondary | ICD-10-CM | POA: Diagnosis not present

## 2014-11-24 DIAGNOSIS — Z5181 Encounter for therapeutic drug level monitoring: Secondary | ICD-10-CM | POA: Diagnosis not present

## 2014-12-15 DIAGNOSIS — E1140 Type 2 diabetes mellitus with diabetic neuropathy, unspecified: Secondary | ICD-10-CM | POA: Diagnosis not present

## 2014-12-15 DIAGNOSIS — E785 Hyperlipidemia, unspecified: Secondary | ICD-10-CM | POA: Diagnosis not present

## 2014-12-15 DIAGNOSIS — I10 Essential (primary) hypertension: Secondary | ICD-10-CM | POA: Diagnosis not present

## 2014-12-15 DIAGNOSIS — J449 Chronic obstructive pulmonary disease, unspecified: Secondary | ICD-10-CM | POA: Diagnosis not present

## 2014-12-15 DIAGNOSIS — Z720 Tobacco use: Secondary | ICD-10-CM | POA: Diagnosis not present

## 2014-12-16 DIAGNOSIS — I2699 Other pulmonary embolism without acute cor pulmonale: Secondary | ICD-10-CM | POA: Diagnosis not present

## 2014-12-16 DIAGNOSIS — Z7901 Long term (current) use of anticoagulants: Secondary | ICD-10-CM | POA: Diagnosis not present

## 2014-12-16 DIAGNOSIS — Z5181 Encounter for therapeutic drug level monitoring: Secondary | ICD-10-CM | POA: Diagnosis not present

## 2015-01-22 DIAGNOSIS — Z01118 Encounter for examination of ears and hearing with other abnormal findings: Secondary | ICD-10-CM | POA: Diagnosis not present

## 2015-01-22 DIAGNOSIS — Z1389 Encounter for screening for other disorder: Secondary | ICD-10-CM | POA: Diagnosis not present

## 2015-01-22 DIAGNOSIS — Z0100 Encounter for examination of eyes and vision without abnormal findings: Secondary | ICD-10-CM | POA: Diagnosis not present

## 2015-01-22 DIAGNOSIS — Z7689 Persons encountering health services in other specified circumstances: Secondary | ICD-10-CM | POA: Diagnosis not present

## 2015-01-22 DIAGNOSIS — Z23 Encounter for immunization: Secondary | ICD-10-CM | POA: Diagnosis not present

## 2015-01-22 DIAGNOSIS — Z0000 Encounter for general adult medical examination without abnormal findings: Secondary | ICD-10-CM | POA: Diagnosis not present

## 2015-01-22 DIAGNOSIS — Z136 Encounter for screening for cardiovascular disorders: Secondary | ICD-10-CM | POA: Diagnosis not present

## 2015-01-22 DIAGNOSIS — E1140 Type 2 diabetes mellitus with diabetic neuropathy, unspecified: Secondary | ICD-10-CM | POA: Diagnosis not present

## 2015-01-23 DIAGNOSIS — Z5181 Encounter for therapeutic drug level monitoring: Secondary | ICD-10-CM | POA: Diagnosis not present

## 2015-01-23 DIAGNOSIS — I2699 Other pulmonary embolism without acute cor pulmonale: Secondary | ICD-10-CM | POA: Diagnosis not present

## 2015-01-23 DIAGNOSIS — Z7901 Long term (current) use of anticoagulants: Secondary | ICD-10-CM | POA: Diagnosis not present

## 2015-01-26 DIAGNOSIS — Z111 Encounter for screening for respiratory tuberculosis: Secondary | ICD-10-CM | POA: Diagnosis not present

## 2015-01-26 DIAGNOSIS — I10 Essential (primary) hypertension: Secondary | ICD-10-CM | POA: Diagnosis not present

## 2015-01-26 DIAGNOSIS — E119 Type 2 diabetes mellitus without complications: Secondary | ICD-10-CM | POA: Diagnosis not present

## 2015-01-26 DIAGNOSIS — E785 Hyperlipidemia, unspecified: Secondary | ICD-10-CM | POA: Diagnosis not present

## 2015-02-05 DIAGNOSIS — E1140 Type 2 diabetes mellitus with diabetic neuropathy, unspecified: Secondary | ICD-10-CM | POA: Diagnosis not present

## 2015-02-05 DIAGNOSIS — Z0000 Encounter for general adult medical examination without abnormal findings: Secondary | ICD-10-CM | POA: Diagnosis not present

## 2015-02-05 DIAGNOSIS — E785 Hyperlipidemia, unspecified: Secondary | ICD-10-CM | POA: Diagnosis not present

## 2015-02-05 DIAGNOSIS — I10 Essential (primary) hypertension: Secondary | ICD-10-CM | POA: Diagnosis not present

## 2015-02-05 DIAGNOSIS — J449 Chronic obstructive pulmonary disease, unspecified: Secondary | ICD-10-CM | POA: Diagnosis not present

## 2015-02-13 DIAGNOSIS — G8929 Other chronic pain: Secondary | ICD-10-CM | POA: Diagnosis not present

## 2015-02-13 DIAGNOSIS — M5442 Lumbago with sciatica, left side: Secondary | ICD-10-CM | POA: Diagnosis not present

## 2015-02-13 DIAGNOSIS — M1612 Unilateral primary osteoarthritis, left hip: Secondary | ICD-10-CM | POA: Diagnosis not present

## 2015-02-13 DIAGNOSIS — M5136 Other intervertebral disc degeneration, lumbar region: Secondary | ICD-10-CM | POA: Diagnosis not present

## 2015-02-13 DIAGNOSIS — G894 Chronic pain syndrome: Secondary | ICD-10-CM | POA: Diagnosis not present

## 2015-02-19 DIAGNOSIS — M1612 Unilateral primary osteoarthritis, left hip: Secondary | ICD-10-CM | POA: Diagnosis not present

## 2015-02-26 DIAGNOSIS — Z5181 Encounter for therapeutic drug level monitoring: Secondary | ICD-10-CM | POA: Diagnosis not present

## 2015-02-26 DIAGNOSIS — Z7901 Long term (current) use of anticoagulants: Secondary | ICD-10-CM | POA: Diagnosis not present

## 2015-02-26 DIAGNOSIS — I2699 Other pulmonary embolism without acute cor pulmonale: Secondary | ICD-10-CM | POA: Diagnosis not present

## 2015-03-19 DIAGNOSIS — H2513 Age-related nuclear cataract, bilateral: Secondary | ICD-10-CM | POA: Diagnosis not present

## 2015-03-19 DIAGNOSIS — H2512 Age-related nuclear cataract, left eye: Secondary | ICD-10-CM | POA: Diagnosis not present

## 2015-03-19 DIAGNOSIS — H5213 Myopia, bilateral: Secondary | ICD-10-CM | POA: Diagnosis not present

## 2015-03-19 DIAGNOSIS — E119 Type 2 diabetes mellitus without complications: Secondary | ICD-10-CM | POA: Diagnosis not present

## 2015-03-26 DIAGNOSIS — Z1231 Encounter for screening mammogram for malignant neoplasm of breast: Secondary | ICD-10-CM | POA: Diagnosis not present

## 2015-03-27 DIAGNOSIS — F329 Major depressive disorder, single episode, unspecified: Secondary | ICD-10-CM | POA: Diagnosis not present

## 2015-03-27 DIAGNOSIS — Z720 Tobacco use: Secondary | ICD-10-CM | POA: Diagnosis not present

## 2015-03-27 DIAGNOSIS — J449 Chronic obstructive pulmonary disease, unspecified: Secondary | ICD-10-CM | POA: Diagnosis not present

## 2015-03-27 DIAGNOSIS — E1140 Type 2 diabetes mellitus with diabetic neuropathy, unspecified: Secondary | ICD-10-CM | POA: Diagnosis not present

## 2015-03-27 DIAGNOSIS — I10 Essential (primary) hypertension: Secondary | ICD-10-CM | POA: Diagnosis not present

## 2015-03-27 DIAGNOSIS — E785 Hyperlipidemia, unspecified: Secondary | ICD-10-CM | POA: Diagnosis not present

## 2015-03-27 DIAGNOSIS — Z86711 Personal history of pulmonary embolism: Secondary | ICD-10-CM | POA: Diagnosis not present

## 2015-04-10 DIAGNOSIS — I10 Essential (primary) hypertension: Secondary | ICD-10-CM | POA: Diagnosis not present

## 2015-04-10 DIAGNOSIS — R300 Dysuria: Secondary | ICD-10-CM | POA: Diagnosis not present

## 2015-04-10 DIAGNOSIS — E1140 Type 2 diabetes mellitus with diabetic neuropathy, unspecified: Secondary | ICD-10-CM | POA: Diagnosis not present

## 2015-04-10 DIAGNOSIS — Z7689 Persons encountering health services in other specified circumstances: Secondary | ICD-10-CM | POA: Diagnosis not present

## 2015-04-10 DIAGNOSIS — E785 Hyperlipidemia, unspecified: Secondary | ICD-10-CM | POA: Diagnosis not present

## 2015-04-10 DIAGNOSIS — G629 Polyneuropathy, unspecified: Secondary | ICD-10-CM | POA: Diagnosis not present

## 2015-04-16 DIAGNOSIS — Z7901 Long term (current) use of anticoagulants: Secondary | ICD-10-CM | POA: Diagnosis not present

## 2015-04-16 DIAGNOSIS — Z8371 Family history of colonic polyps: Secondary | ICD-10-CM | POA: Diagnosis not present

## 2015-04-16 DIAGNOSIS — K5900 Constipation, unspecified: Secondary | ICD-10-CM | POA: Diagnosis not present

## 2015-04-24 DIAGNOSIS — Z1211 Encounter for screening for malignant neoplasm of colon: Secondary | ICD-10-CM | POA: Diagnosis not present

## 2015-04-24 DIAGNOSIS — Z8371 Family history of colonic polyps: Secondary | ICD-10-CM | POA: Diagnosis not present

## 2015-05-08 DIAGNOSIS — I2609 Other pulmonary embolism with acute cor pulmonale: Secondary | ICD-10-CM | POA: Diagnosis not present

## 2015-05-08 DIAGNOSIS — Z5181 Encounter for therapeutic drug level monitoring: Secondary | ICD-10-CM | POA: Diagnosis not present

## 2015-05-08 DIAGNOSIS — Z7901 Long term (current) use of anticoagulants: Secondary | ICD-10-CM | POA: Diagnosis not present

## 2015-05-08 DIAGNOSIS — R791 Abnormal coagulation profile: Secondary | ICD-10-CM | POA: Diagnosis not present

## 2015-05-14 DIAGNOSIS — G894 Chronic pain syndrome: Secondary | ICD-10-CM | POA: Diagnosis not present

## 2015-05-14 DIAGNOSIS — M5442 Lumbago with sciatica, left side: Secondary | ICD-10-CM | POA: Diagnosis not present

## 2015-05-14 DIAGNOSIS — M5136 Other intervertebral disc degeneration, lumbar region: Secondary | ICD-10-CM | POA: Diagnosis not present

## 2015-05-14 DIAGNOSIS — G8929 Other chronic pain: Secondary | ICD-10-CM | POA: Diagnosis not present

## 2015-05-14 DIAGNOSIS — M25552 Pain in left hip: Secondary | ICD-10-CM | POA: Diagnosis not present

## 2015-06-12 DIAGNOSIS — E1140 Type 2 diabetes mellitus with diabetic neuropathy, unspecified: Secondary | ICD-10-CM | POA: Diagnosis not present

## 2015-06-12 DIAGNOSIS — I10 Essential (primary) hypertension: Secondary | ICD-10-CM | POA: Diagnosis not present

## 2015-06-12 DIAGNOSIS — J449 Chronic obstructive pulmonary disease, unspecified: Secondary | ICD-10-CM | POA: Diagnosis not present

## 2015-06-12 DIAGNOSIS — G629 Polyneuropathy, unspecified: Secondary | ICD-10-CM | POA: Diagnosis not present

## 2015-06-12 DIAGNOSIS — E785 Hyperlipidemia, unspecified: Secondary | ICD-10-CM | POA: Diagnosis not present

## 2015-06-26 DIAGNOSIS — M5442 Lumbago with sciatica, left side: Secondary | ICD-10-CM | POA: Diagnosis not present

## 2015-06-26 DIAGNOSIS — G8929 Other chronic pain: Secondary | ICD-10-CM | POA: Diagnosis not present

## 2015-06-26 DIAGNOSIS — M4310 Spondylolisthesis, site unspecified: Secondary | ICD-10-CM | POA: Diagnosis not present

## 2015-06-26 DIAGNOSIS — M5136 Other intervertebral disc degeneration, lumbar region: Secondary | ICD-10-CM | POA: Diagnosis not present

## 2015-07-02 DIAGNOSIS — N390 Urinary tract infection, site not specified: Secondary | ICD-10-CM | POA: Diagnosis not present

## 2015-07-02 DIAGNOSIS — Z0000 Encounter for general adult medical examination without abnormal findings: Secondary | ICD-10-CM | POA: Diagnosis not present

## 2015-07-02 DIAGNOSIS — G629 Polyneuropathy, unspecified: Secondary | ICD-10-CM | POA: Diagnosis not present

## 2015-07-02 DIAGNOSIS — N760 Acute vaginitis: Secondary | ICD-10-CM | POA: Diagnosis not present

## 2015-07-02 DIAGNOSIS — E1140 Type 2 diabetes mellitus with diabetic neuropathy, unspecified: Secondary | ICD-10-CM | POA: Diagnosis not present

## 2015-07-02 DIAGNOSIS — R319 Hematuria, unspecified: Secondary | ICD-10-CM | POA: Diagnosis not present

## 2015-07-02 DIAGNOSIS — E785 Hyperlipidemia, unspecified: Secondary | ICD-10-CM | POA: Diagnosis not present

## 2015-07-02 DIAGNOSIS — I10 Essential (primary) hypertension: Secondary | ICD-10-CM | POA: Diagnosis not present

## 2015-07-10 DIAGNOSIS — Z86711 Personal history of pulmonary embolism: Secondary | ICD-10-CM | POA: Diagnosis not present

## 2015-07-10 DIAGNOSIS — Z79899 Other long term (current) drug therapy: Secondary | ICD-10-CM | POA: Diagnosis not present

## 2015-07-10 DIAGNOSIS — Z7901 Long term (current) use of anticoagulants: Secondary | ICD-10-CM | POA: Diagnosis not present

## 2015-07-10 DIAGNOSIS — Z5181 Encounter for therapeutic drug level monitoring: Secondary | ICD-10-CM | POA: Diagnosis not present

## 2015-07-17 DIAGNOSIS — E1140 Type 2 diabetes mellitus with diabetic neuropathy, unspecified: Secondary | ICD-10-CM | POA: Diagnosis not present

## 2015-07-17 DIAGNOSIS — G629 Polyneuropathy, unspecified: Secondary | ICD-10-CM | POA: Diagnosis not present

## 2015-07-17 DIAGNOSIS — N760 Acute vaginitis: Secondary | ICD-10-CM | POA: Diagnosis not present

## 2015-07-17 DIAGNOSIS — E785 Hyperlipidemia, unspecified: Secondary | ICD-10-CM | POA: Diagnosis not present

## 2015-07-17 DIAGNOSIS — I10 Essential (primary) hypertension: Secondary | ICD-10-CM | POA: Diagnosis not present

## 2015-07-17 DIAGNOSIS — R319 Hematuria, unspecified: Secondary | ICD-10-CM | POA: Diagnosis not present

## 2015-08-07 DIAGNOSIS — E785 Hyperlipidemia, unspecified: Secondary | ICD-10-CM | POA: Diagnosis not present

## 2015-08-07 DIAGNOSIS — I10 Essential (primary) hypertension: Secondary | ICD-10-CM | POA: Diagnosis not present

## 2015-08-07 DIAGNOSIS — G629 Polyneuropathy, unspecified: Secondary | ICD-10-CM | POA: Diagnosis not present

## 2015-08-07 DIAGNOSIS — Z7689 Persons encountering health services in other specified circumstances: Secondary | ICD-10-CM | POA: Diagnosis not present

## 2015-08-07 DIAGNOSIS — R319 Hematuria, unspecified: Secondary | ICD-10-CM | POA: Diagnosis not present

## 2015-08-07 DIAGNOSIS — E1140 Type 2 diabetes mellitus with diabetic neuropathy, unspecified: Secondary | ICD-10-CM | POA: Diagnosis not present

## 2015-08-20 DIAGNOSIS — R319 Hematuria, unspecified: Secondary | ICD-10-CM | POA: Diagnosis not present

## 2015-08-20 DIAGNOSIS — N390 Urinary tract infection, site not specified: Secondary | ICD-10-CM | POA: Diagnosis not present

## 2015-08-27 DIAGNOSIS — R319 Hematuria, unspecified: Secondary | ICD-10-CM | POA: Diagnosis not present

## 2015-08-27 DIAGNOSIS — K8020 Calculus of gallbladder without cholecystitis without obstruction: Secondary | ICD-10-CM | POA: Diagnosis not present

## 2015-08-28 DIAGNOSIS — G629 Polyneuropathy, unspecified: Secondary | ICD-10-CM | POA: Diagnosis not present

## 2015-08-28 DIAGNOSIS — I10 Essential (primary) hypertension: Secondary | ICD-10-CM | POA: Diagnosis not present

## 2015-08-28 DIAGNOSIS — E785 Hyperlipidemia, unspecified: Secondary | ICD-10-CM | POA: Diagnosis not present

## 2015-08-28 DIAGNOSIS — R319 Hematuria, unspecified: Secondary | ICD-10-CM | POA: Diagnosis not present

## 2015-08-28 DIAGNOSIS — E1140 Type 2 diabetes mellitus with diabetic neuropathy, unspecified: Secondary | ICD-10-CM | POA: Diagnosis not present

## 2015-09-03 DIAGNOSIS — R319 Hematuria, unspecified: Secondary | ICD-10-CM | POA: Diagnosis not present

## 2015-09-03 DIAGNOSIS — N390 Urinary tract infection, site not specified: Secondary | ICD-10-CM | POA: Diagnosis not present

## 2015-09-23 DIAGNOSIS — Z7689 Persons encountering health services in other specified circumstances: Secondary | ICD-10-CM | POA: Diagnosis not present

## 2015-09-23 DIAGNOSIS — G629 Polyneuropathy, unspecified: Secondary | ICD-10-CM | POA: Diagnosis not present

## 2015-09-23 DIAGNOSIS — E785 Hyperlipidemia, unspecified: Secondary | ICD-10-CM | POA: Diagnosis not present

## 2015-09-23 DIAGNOSIS — I10 Essential (primary) hypertension: Secondary | ICD-10-CM | POA: Diagnosis not present

## 2015-09-23 DIAGNOSIS — E1140 Type 2 diabetes mellitus with diabetic neuropathy, unspecified: Secondary | ICD-10-CM | POA: Diagnosis not present

## 2015-09-23 DIAGNOSIS — R0989 Other specified symptoms and signs involving the circulatory and respiratory systems: Secondary | ICD-10-CM | POA: Diagnosis not present

## 2015-09-23 DIAGNOSIS — R319 Hematuria, unspecified: Secondary | ICD-10-CM | POA: Diagnosis not present

## 2015-09-25 DIAGNOSIS — G894 Chronic pain syndrome: Secondary | ICD-10-CM | POA: Diagnosis not present

## 2015-09-25 DIAGNOSIS — G8929 Other chronic pain: Secondary | ICD-10-CM | POA: Diagnosis not present

## 2015-09-25 DIAGNOSIS — M5136 Other intervertebral disc degeneration, lumbar region: Secondary | ICD-10-CM | POA: Diagnosis not present

## 2015-09-25 DIAGNOSIS — M5442 Lumbago with sciatica, left side: Secondary | ICD-10-CM | POA: Diagnosis not present

## 2015-10-01 DIAGNOSIS — Z5181 Encounter for therapeutic drug level monitoring: Secondary | ICD-10-CM | POA: Diagnosis not present

## 2015-10-01 DIAGNOSIS — Z86711 Personal history of pulmonary embolism: Secondary | ICD-10-CM | POA: Diagnosis not present

## 2015-10-01 DIAGNOSIS — Z7901 Long term (current) use of anticoagulants: Secondary | ICD-10-CM | POA: Diagnosis not present

## 2015-10-29 DIAGNOSIS — M79641 Pain in right hand: Secondary | ICD-10-CM | POA: Diagnosis not present

## 2015-10-29 DIAGNOSIS — M65311 Trigger thumb, right thumb: Secondary | ICD-10-CM | POA: Diagnosis not present

## 2015-11-05 DIAGNOSIS — Z7901 Long term (current) use of anticoagulants: Secondary | ICD-10-CM | POA: Diagnosis not present

## 2015-11-05 DIAGNOSIS — Z86711 Personal history of pulmonary embolism: Secondary | ICD-10-CM | POA: Diagnosis not present

## 2015-11-05 DIAGNOSIS — Z5181 Encounter for therapeutic drug level monitoring: Secondary | ICD-10-CM | POA: Diagnosis not present

## 2015-12-10 DIAGNOSIS — Z86711 Personal history of pulmonary embolism: Secondary | ICD-10-CM | POA: Diagnosis not present

## 2015-12-10 DIAGNOSIS — R791 Abnormal coagulation profile: Secondary | ICD-10-CM | POA: Diagnosis not present

## 2015-12-10 DIAGNOSIS — Z5181 Encounter for therapeutic drug level monitoring: Secondary | ICD-10-CM | POA: Diagnosis not present

## 2015-12-10 DIAGNOSIS — Z7901 Long term (current) use of anticoagulants: Secondary | ICD-10-CM | POA: Diagnosis not present

## 2016-01-01 DIAGNOSIS — G629 Polyneuropathy, unspecified: Secondary | ICD-10-CM | POA: Diagnosis not present

## 2016-01-01 DIAGNOSIS — E785 Hyperlipidemia, unspecified: Secondary | ICD-10-CM | POA: Diagnosis not present

## 2016-01-01 DIAGNOSIS — R609 Edema, unspecified: Secondary | ICD-10-CM | POA: Diagnosis not present

## 2016-01-01 DIAGNOSIS — J449 Chronic obstructive pulmonary disease, unspecified: Secondary | ICD-10-CM | POA: Diagnosis not present

## 2016-01-01 DIAGNOSIS — I10 Essential (primary) hypertension: Secondary | ICD-10-CM | POA: Diagnosis not present

## 2016-01-01 DIAGNOSIS — E1140 Type 2 diabetes mellitus with diabetic neuropathy, unspecified: Secondary | ICD-10-CM | POA: Diagnosis not present

## 2016-03-04 DIAGNOSIS — Z0100 Encounter for examination of eyes and vision without abnormal findings: Secondary | ICD-10-CM | POA: Diagnosis not present

## 2016-03-04 DIAGNOSIS — Z01118 Encounter for examination of ears and hearing with other abnormal findings: Secondary | ICD-10-CM | POA: Diagnosis not present

## 2016-03-04 DIAGNOSIS — I10 Essential (primary) hypertension: Secondary | ICD-10-CM | POA: Diagnosis not present

## 2016-03-04 DIAGNOSIS — G629 Polyneuropathy, unspecified: Secondary | ICD-10-CM | POA: Diagnosis not present

## 2016-03-04 DIAGNOSIS — Z131 Encounter for screening for diabetes mellitus: Secondary | ICD-10-CM | POA: Diagnosis not present

## 2016-03-04 DIAGNOSIS — E785 Hyperlipidemia, unspecified: Secondary | ICD-10-CM | POA: Diagnosis not present

## 2016-03-04 DIAGNOSIS — Z23 Encounter for immunization: Secondary | ICD-10-CM | POA: Diagnosis not present

## 2016-03-04 DIAGNOSIS — Z1389 Encounter for screening for other disorder: Secondary | ICD-10-CM | POA: Diagnosis not present

## 2016-03-04 DIAGNOSIS — E1140 Type 2 diabetes mellitus with diabetic neuropathy, unspecified: Secondary | ICD-10-CM | POA: Diagnosis not present

## 2016-03-04 DIAGNOSIS — Z0000 Encounter for general adult medical examination without abnormal findings: Secondary | ICD-10-CM | POA: Diagnosis not present

## 2016-04-15 DIAGNOSIS — M5136 Other intervertebral disc degeneration, lumbar region: Secondary | ICD-10-CM | POA: Diagnosis not present

## 2016-04-15 DIAGNOSIS — M25552 Pain in left hip: Secondary | ICD-10-CM | POA: Diagnosis not present

## 2016-04-21 DIAGNOSIS — Z86711 Personal history of pulmonary embolism: Secondary | ICD-10-CM | POA: Diagnosis not present

## 2016-04-21 DIAGNOSIS — Z79899 Other long term (current) drug therapy: Secondary | ICD-10-CM | POA: Diagnosis not present

## 2016-04-21 DIAGNOSIS — Z7901 Long term (current) use of anticoagulants: Secondary | ICD-10-CM | POA: Diagnosis not present

## 2016-04-21 DIAGNOSIS — R791 Abnormal coagulation profile: Secondary | ICD-10-CM | POA: Diagnosis not present

## 2016-04-21 DIAGNOSIS — Z5181 Encounter for therapeutic drug level monitoring: Secondary | ICD-10-CM | POA: Diagnosis not present

## 2016-04-22 DIAGNOSIS — E1140 Type 2 diabetes mellitus with diabetic neuropathy, unspecified: Secondary | ICD-10-CM | POA: Diagnosis not present

## 2016-04-22 DIAGNOSIS — G629 Polyneuropathy, unspecified: Secondary | ICD-10-CM | POA: Diagnosis not present

## 2016-04-22 DIAGNOSIS — E785 Hyperlipidemia, unspecified: Secondary | ICD-10-CM | POA: Diagnosis not present

## 2016-04-22 DIAGNOSIS — I10 Essential (primary) hypertension: Secondary | ICD-10-CM | POA: Diagnosis not present

## 2016-04-22 DIAGNOSIS — Z0000 Encounter for general adult medical examination without abnormal findings: Secondary | ICD-10-CM | POA: Diagnosis not present

## 2016-04-28 DIAGNOSIS — M25552 Pain in left hip: Secondary | ICD-10-CM | POA: Diagnosis not present

## 2016-04-29 DIAGNOSIS — E785 Hyperlipidemia, unspecified: Secondary | ICD-10-CM | POA: Diagnosis not present

## 2016-04-29 DIAGNOSIS — R233 Spontaneous ecchymoses: Secondary | ICD-10-CM | POA: Diagnosis not present

## 2016-04-29 DIAGNOSIS — E1140 Type 2 diabetes mellitus with diabetic neuropathy, unspecified: Secondary | ICD-10-CM | POA: Diagnosis not present

## 2016-04-29 DIAGNOSIS — G629 Polyneuropathy, unspecified: Secondary | ICD-10-CM | POA: Diagnosis not present

## 2016-04-29 DIAGNOSIS — I10 Essential (primary) hypertension: Secondary | ICD-10-CM | POA: Diagnosis not present

## 2016-05-13 DIAGNOSIS — M5136 Other intervertebral disc degeneration, lumbar region: Secondary | ICD-10-CM | POA: Diagnosis not present

## 2016-05-13 DIAGNOSIS — M5442 Lumbago with sciatica, left side: Secondary | ICD-10-CM | POA: Diagnosis not present

## 2016-05-13 DIAGNOSIS — G8929 Other chronic pain: Secondary | ICD-10-CM | POA: Diagnosis not present

## 2016-05-13 DIAGNOSIS — M25552 Pain in left hip: Secondary | ICD-10-CM | POA: Diagnosis not present

## 2016-06-03 DIAGNOSIS — R1013 Epigastric pain: Secondary | ICD-10-CM | POA: Diagnosis not present

## 2016-06-03 DIAGNOSIS — I10 Essential (primary) hypertension: Secondary | ICD-10-CM | POA: Diagnosis not present

## 2016-06-03 DIAGNOSIS — Z5181 Encounter for therapeutic drug level monitoring: Secondary | ICD-10-CM | POA: Diagnosis not present

## 2016-06-03 DIAGNOSIS — E1140 Type 2 diabetes mellitus with diabetic neuropathy, unspecified: Secondary | ICD-10-CM | POA: Diagnosis not present

## 2016-06-03 DIAGNOSIS — Z7901 Long term (current) use of anticoagulants: Secondary | ICD-10-CM | POA: Diagnosis not present

## 2016-06-03 DIAGNOSIS — Z79899 Other long term (current) drug therapy: Secondary | ICD-10-CM | POA: Diagnosis not present

## 2016-06-03 DIAGNOSIS — E785 Hyperlipidemia, unspecified: Secondary | ICD-10-CM | POA: Diagnosis not present

## 2016-06-03 DIAGNOSIS — Z86711 Personal history of pulmonary embolism: Secondary | ICD-10-CM | POA: Diagnosis not present

## 2016-06-03 DIAGNOSIS — G629 Polyneuropathy, unspecified: Secondary | ICD-10-CM | POA: Diagnosis not present

## 2016-06-22 DIAGNOSIS — R1013 Epigastric pain: Secondary | ICD-10-CM | POA: Diagnosis not present

## 2016-06-22 DIAGNOSIS — E785 Hyperlipidemia, unspecified: Secondary | ICD-10-CM | POA: Diagnosis not present

## 2016-06-22 DIAGNOSIS — J069 Acute upper respiratory infection, unspecified: Secondary | ICD-10-CM | POA: Diagnosis not present

## 2016-06-22 DIAGNOSIS — I10 Essential (primary) hypertension: Secondary | ICD-10-CM | POA: Diagnosis not present

## 2016-06-28 DIAGNOSIS — Z7901 Long term (current) use of anticoagulants: Secondary | ICD-10-CM | POA: Diagnosis not present

## 2016-06-28 DIAGNOSIS — Z79899 Other long term (current) drug therapy: Secondary | ICD-10-CM | POA: Diagnosis not present

## 2016-06-28 DIAGNOSIS — Z5181 Encounter for therapeutic drug level monitoring: Secondary | ICD-10-CM | POA: Diagnosis not present

## 2016-06-28 DIAGNOSIS — Z86711 Personal history of pulmonary embolism: Secondary | ICD-10-CM | POA: Diagnosis not present

## 2016-07-01 DIAGNOSIS — E785 Hyperlipidemia, unspecified: Secondary | ICD-10-CM | POA: Diagnosis not present

## 2016-07-01 DIAGNOSIS — I10 Essential (primary) hypertension: Secondary | ICD-10-CM | POA: Diagnosis not present

## 2016-07-01 DIAGNOSIS — E1140 Type 2 diabetes mellitus with diabetic neuropathy, unspecified: Secondary | ICD-10-CM | POA: Diagnosis not present

## 2016-07-01 DIAGNOSIS — G629 Polyneuropathy, unspecified: Secondary | ICD-10-CM | POA: Diagnosis not present

## 2016-07-01 DIAGNOSIS — R1013 Epigastric pain: Secondary | ICD-10-CM | POA: Diagnosis not present

## 2016-07-07 DIAGNOSIS — Z1231 Encounter for screening mammogram for malignant neoplasm of breast: Secondary | ICD-10-CM | POA: Diagnosis not present

## 2016-07-28 DIAGNOSIS — Z5181 Encounter for therapeutic drug level monitoring: Secondary | ICD-10-CM | POA: Diagnosis not present

## 2016-07-28 DIAGNOSIS — Z79899 Other long term (current) drug therapy: Secondary | ICD-10-CM | POA: Diagnosis not present

## 2016-07-28 DIAGNOSIS — I2609 Other pulmonary embolism with acute cor pulmonale: Secondary | ICD-10-CM | POA: Diagnosis not present

## 2016-07-28 DIAGNOSIS — Z86711 Personal history of pulmonary embolism: Secondary | ICD-10-CM | POA: Diagnosis not present

## 2016-07-28 DIAGNOSIS — Z7901 Long term (current) use of anticoagulants: Secondary | ICD-10-CM | POA: Diagnosis not present

## 2016-09-01 DIAGNOSIS — Z5181 Encounter for therapeutic drug level monitoring: Secondary | ICD-10-CM | POA: Diagnosis not present

## 2016-09-01 DIAGNOSIS — R791 Abnormal coagulation profile: Secondary | ICD-10-CM | POA: Diagnosis not present

## 2016-09-01 DIAGNOSIS — Z79899 Other long term (current) drug therapy: Secondary | ICD-10-CM | POA: Diagnosis not present

## 2016-09-01 DIAGNOSIS — Z86711 Personal history of pulmonary embolism: Secondary | ICD-10-CM | POA: Diagnosis not present

## 2016-09-15 DIAGNOSIS — I10 Essential (primary) hypertension: Secondary | ICD-10-CM | POA: Diagnosis not present

## 2016-09-15 DIAGNOSIS — E785 Hyperlipidemia, unspecified: Secondary | ICD-10-CM | POA: Diagnosis not present

## 2016-09-15 DIAGNOSIS — R791 Abnormal coagulation profile: Secondary | ICD-10-CM | POA: Diagnosis not present

## 2016-09-15 DIAGNOSIS — G629 Polyneuropathy, unspecified: Secondary | ICD-10-CM | POA: Diagnosis not present

## 2016-09-15 DIAGNOSIS — Z0000 Encounter for general adult medical examination without abnormal findings: Secondary | ICD-10-CM | POA: Diagnosis not present

## 2016-09-15 DIAGNOSIS — Z5181 Encounter for therapeutic drug level monitoring: Secondary | ICD-10-CM | POA: Diagnosis not present

## 2016-09-15 DIAGNOSIS — Z79899 Other long term (current) drug therapy: Secondary | ICD-10-CM | POA: Diagnosis not present

## 2016-09-15 DIAGNOSIS — E1140 Type 2 diabetes mellitus with diabetic neuropathy, unspecified: Secondary | ICD-10-CM | POA: Diagnosis not present

## 2016-09-15 DIAGNOSIS — Z86711 Personal history of pulmonary embolism: Secondary | ICD-10-CM | POA: Diagnosis not present

## 2016-09-15 DIAGNOSIS — R1013 Epigastric pain: Secondary | ICD-10-CM | POA: Diagnosis not present

## 2016-09-22 DIAGNOSIS — Z79891 Long term (current) use of opiate analgesic: Secondary | ICD-10-CM | POA: Diagnosis not present

## 2016-09-22 DIAGNOSIS — M5442 Lumbago with sciatica, left side: Secondary | ICD-10-CM | POA: Diagnosis not present

## 2016-09-22 DIAGNOSIS — M5136 Other intervertebral disc degeneration, lumbar region: Secondary | ICD-10-CM | POA: Diagnosis not present

## 2016-09-22 DIAGNOSIS — G8929 Other chronic pain: Secondary | ICD-10-CM | POA: Diagnosis not present

## 2016-09-23 DIAGNOSIS — Z8371 Family history of colonic polyps: Secondary | ICD-10-CM | POA: Diagnosis not present

## 2016-09-23 DIAGNOSIS — R112 Nausea with vomiting, unspecified: Secondary | ICD-10-CM | POA: Diagnosis not present

## 2016-09-27 ENCOUNTER — Other Ambulatory Visit: Payer: Self-pay | Admitting: Gastroenterology

## 2016-09-27 DIAGNOSIS — R112 Nausea with vomiting, unspecified: Secondary | ICD-10-CM

## 2016-09-27 DIAGNOSIS — E119 Type 2 diabetes mellitus without complications: Secondary | ICD-10-CM

## 2016-10-07 ENCOUNTER — Ambulatory Visit
Admission: RE | Admit: 2016-10-07 | Discharge: 2016-10-07 | Disposition: A | Discharge status: Home / Self Care | Payer: Medicare Other | Source: Ambulatory Visit | Attending: Gastroenterology | Admitting: Gastroenterology

## 2016-10-07 DIAGNOSIS — E119 Type 2 diabetes mellitus without complications: Secondary | ICD-10-CM

## 2016-10-07 DIAGNOSIS — K449 Diaphragmatic hernia without obstruction or gangrene: Secondary | ICD-10-CM | POA: Diagnosis not present

## 2016-10-07 DIAGNOSIS — R112 Nausea with vomiting, unspecified: Secondary | ICD-10-CM

## 2016-10-27 DIAGNOSIS — R1013 Epigastric pain: Secondary | ICD-10-CM | POA: Diagnosis not present

## 2016-10-27 DIAGNOSIS — E1140 Type 2 diabetes mellitus with diabetic neuropathy, unspecified: Secondary | ICD-10-CM | POA: Diagnosis not present

## 2016-10-27 DIAGNOSIS — Z5181 Encounter for therapeutic drug level monitoring: Secondary | ICD-10-CM | POA: Diagnosis not present

## 2016-10-27 DIAGNOSIS — G629 Polyneuropathy, unspecified: Secondary | ICD-10-CM | POA: Diagnosis not present

## 2016-10-27 DIAGNOSIS — I10 Essential (primary) hypertension: Secondary | ICD-10-CM | POA: Diagnosis not present

## 2016-10-27 DIAGNOSIS — Z79899 Other long term (current) drug therapy: Secondary | ICD-10-CM | POA: Diagnosis not present

## 2016-10-27 DIAGNOSIS — R791 Abnormal coagulation profile: Secondary | ICD-10-CM | POA: Diagnosis not present

## 2016-10-27 DIAGNOSIS — E785 Hyperlipidemia, unspecified: Secondary | ICD-10-CM | POA: Diagnosis not present

## 2016-10-27 DIAGNOSIS — Z86711 Personal history of pulmonary embolism: Secondary | ICD-10-CM | POA: Diagnosis not present

## 2016-11-24 DIAGNOSIS — Z7901 Long term (current) use of anticoagulants: Secondary | ICD-10-CM | POA: Diagnosis not present

## 2016-11-24 DIAGNOSIS — I10 Essential (primary) hypertension: Secondary | ICD-10-CM | POA: Diagnosis not present

## 2016-11-24 DIAGNOSIS — E1140 Type 2 diabetes mellitus with diabetic neuropathy, unspecified: Secondary | ICD-10-CM | POA: Diagnosis not present

## 2016-11-24 DIAGNOSIS — R1013 Epigastric pain: Secondary | ICD-10-CM | POA: Diagnosis not present

## 2016-11-25 DIAGNOSIS — Z8371 Family history of colonic polyps: Secondary | ICD-10-CM | POA: Diagnosis not present

## 2016-11-25 DIAGNOSIS — R112 Nausea with vomiting, unspecified: Secondary | ICD-10-CM | POA: Diagnosis not present

## 2016-12-05 DIAGNOSIS — N390 Urinary tract infection, site not specified: Secondary | ICD-10-CM | POA: Diagnosis not present

## 2016-12-16 DIAGNOSIS — E1140 Type 2 diabetes mellitus with diabetic neuropathy, unspecified: Secondary | ICD-10-CM | POA: Diagnosis not present

## 2016-12-16 DIAGNOSIS — M6701 Short Achilles tendon (acquired), right ankle: Secondary | ICD-10-CM | POA: Diagnosis not present

## 2016-12-16 DIAGNOSIS — M6702 Short Achilles tendon (acquired), left ankle: Secondary | ICD-10-CM | POA: Diagnosis not present

## 2016-12-22 DIAGNOSIS — Z5181 Encounter for therapeutic drug level monitoring: Secondary | ICD-10-CM | POA: Diagnosis not present

## 2016-12-22 DIAGNOSIS — Z79899 Other long term (current) drug therapy: Secondary | ICD-10-CM | POA: Diagnosis not present

## 2016-12-22 DIAGNOSIS — Z86711 Personal history of pulmonary embolism: Secondary | ICD-10-CM | POA: Diagnosis not present

## 2016-12-22 DIAGNOSIS — R791 Abnormal coagulation profile: Secondary | ICD-10-CM | POA: Diagnosis not present

## 2017-01-06 DIAGNOSIS — G8929 Other chronic pain: Secondary | ICD-10-CM | POA: Diagnosis not present

## 2017-01-06 DIAGNOSIS — M25552 Pain in left hip: Secondary | ICD-10-CM | POA: Diagnosis not present

## 2017-01-06 DIAGNOSIS — M5442 Lumbago with sciatica, left side: Secondary | ICD-10-CM | POA: Diagnosis not present

## 2017-01-06 DIAGNOSIS — G894 Chronic pain syndrome: Secondary | ICD-10-CM | POA: Diagnosis not present

## 2017-01-06 DIAGNOSIS — Z79891 Long term (current) use of opiate analgesic: Secondary | ICD-10-CM | POA: Diagnosis not present

## 2017-01-12 DIAGNOSIS — G629 Polyneuropathy, unspecified: Secondary | ICD-10-CM | POA: Diagnosis not present

## 2017-01-12 DIAGNOSIS — E785 Hyperlipidemia, unspecified: Secondary | ICD-10-CM | POA: Diagnosis not present

## 2017-01-12 DIAGNOSIS — I10 Essential (primary) hypertension: Secondary | ICD-10-CM | POA: Diagnosis not present

## 2017-01-12 DIAGNOSIS — R1013 Epigastric pain: Secondary | ICD-10-CM | POA: Diagnosis not present

## 2017-01-12 DIAGNOSIS — E1140 Type 2 diabetes mellitus with diabetic neuropathy, unspecified: Secondary | ICD-10-CM | POA: Diagnosis not present

## 2017-01-12 DIAGNOSIS — Z7689 Persons encountering health services in other specified circumstances: Secondary | ICD-10-CM | POA: Diagnosis not present

## 2017-01-18 DIAGNOSIS — M1612 Unilateral primary osteoarthritis, left hip: Secondary | ICD-10-CM | POA: Diagnosis not present

## 2017-01-25 DIAGNOSIS — E782 Mixed hyperlipidemia: Secondary | ICD-10-CM | POA: Diagnosis not present

## 2017-01-25 DIAGNOSIS — Z86711 Personal history of pulmonary embolism: Secondary | ICD-10-CM | POA: Diagnosis not present

## 2017-01-25 DIAGNOSIS — E119 Type 2 diabetes mellitus without complications: Secondary | ICD-10-CM | POA: Diagnosis not present

## 2017-01-25 DIAGNOSIS — I2699 Other pulmonary embolism without acute cor pulmonale: Secondary | ICD-10-CM | POA: Diagnosis not present

## 2017-02-17 DIAGNOSIS — R791 Abnormal coagulation profile: Secondary | ICD-10-CM | POA: Diagnosis not present

## 2017-02-17 DIAGNOSIS — Z86711 Personal history of pulmonary embolism: Secondary | ICD-10-CM | POA: Diagnosis not present

## 2017-02-17 DIAGNOSIS — Z79899 Other long term (current) drug therapy: Secondary | ICD-10-CM | POA: Diagnosis not present

## 2017-02-17 DIAGNOSIS — Z5181 Encounter for therapeutic drug level monitoring: Secondary | ICD-10-CM | POA: Diagnosis not present

## 2017-02-23 DIAGNOSIS — R1013 Epigastric pain: Secondary | ICD-10-CM | POA: Diagnosis not present

## 2017-02-23 DIAGNOSIS — I10 Essential (primary) hypertension: Secondary | ICD-10-CM | POA: Diagnosis not present

## 2017-02-23 DIAGNOSIS — Z23 Encounter for immunization: Secondary | ICD-10-CM | POA: Diagnosis not present

## 2017-02-23 DIAGNOSIS — Z5181 Encounter for therapeutic drug level monitoring: Secondary | ICD-10-CM | POA: Diagnosis not present

## 2017-02-23 DIAGNOSIS — G629 Polyneuropathy, unspecified: Secondary | ICD-10-CM | POA: Diagnosis not present

## 2017-02-23 DIAGNOSIS — Z79899 Other long term (current) drug therapy: Secondary | ICD-10-CM | POA: Diagnosis not present

## 2017-02-23 DIAGNOSIS — Z86711 Personal history of pulmonary embolism: Secondary | ICD-10-CM | POA: Diagnosis not present

## 2017-02-23 DIAGNOSIS — R791 Abnormal coagulation profile: Secondary | ICD-10-CM | POA: Diagnosis not present

## 2017-02-23 DIAGNOSIS — E1140 Type 2 diabetes mellitus with diabetic neuropathy, unspecified: Secondary | ICD-10-CM | POA: Diagnosis not present

## 2017-02-23 DIAGNOSIS — E785 Hyperlipidemia, unspecified: Secondary | ICD-10-CM | POA: Diagnosis not present

## 2017-03-07 DIAGNOSIS — R791 Abnormal coagulation profile: Secondary | ICD-10-CM | POA: Diagnosis not present

## 2017-03-07 DIAGNOSIS — Z79899 Other long term (current) drug therapy: Secondary | ICD-10-CM | POA: Diagnosis not present

## 2017-03-07 DIAGNOSIS — Z5181 Encounter for therapeutic drug level monitoring: Secondary | ICD-10-CM | POA: Diagnosis not present

## 2017-03-07 DIAGNOSIS — Z86711 Personal history of pulmonary embolism: Secondary | ICD-10-CM | POA: Diagnosis not present

## 2017-03-23 DIAGNOSIS — E1140 Type 2 diabetes mellitus with diabetic neuropathy, unspecified: Secondary | ICD-10-CM | POA: Diagnosis not present

## 2017-03-23 DIAGNOSIS — I10 Essential (primary) hypertension: Secondary | ICD-10-CM | POA: Diagnosis not present

## 2017-03-23 DIAGNOSIS — Z0000 Encounter for general adult medical examination without abnormal findings: Secondary | ICD-10-CM | POA: Diagnosis not present

## 2017-03-23 DIAGNOSIS — H2513 Age-related nuclear cataract, bilateral: Secondary | ICD-10-CM | POA: Diagnosis not present

## 2017-03-23 DIAGNOSIS — E785 Hyperlipidemia, unspecified: Secondary | ICD-10-CM | POA: Diagnosis not present

## 2017-03-23 DIAGNOSIS — E119 Type 2 diabetes mellitus without complications: Secondary | ICD-10-CM | POA: Diagnosis not present

## 2017-03-23 DIAGNOSIS — H25013 Cortical age-related cataract, bilateral: Secondary | ICD-10-CM | POA: Diagnosis not present

## 2017-03-23 DIAGNOSIS — R1013 Epigastric pain: Secondary | ICD-10-CM | POA: Diagnosis not present

## 2017-03-23 DIAGNOSIS — Z7984 Long term (current) use of oral hypoglycemic drugs: Secondary | ICD-10-CM | POA: Diagnosis not present

## 2017-03-23 DIAGNOSIS — H5213 Myopia, bilateral: Secondary | ICD-10-CM | POA: Diagnosis not present

## 2017-04-07 DIAGNOSIS — G8929 Other chronic pain: Secondary | ICD-10-CM | POA: Diagnosis not present

## 2017-04-07 DIAGNOSIS — M5442 Lumbago with sciatica, left side: Secondary | ICD-10-CM | POA: Diagnosis not present

## 2017-05-11 DIAGNOSIS — R110 Nausea: Secondary | ICD-10-CM | POA: Diagnosis not present

## 2017-05-11 DIAGNOSIS — R05 Cough: Secondary | ICD-10-CM | POA: Diagnosis not present

## 2017-05-11 DIAGNOSIS — J029 Acute pharyngitis, unspecified: Secondary | ICD-10-CM | POA: Diagnosis not present

## 2017-05-11 DIAGNOSIS — I10 Essential (primary) hypertension: Secondary | ICD-10-CM | POA: Diagnosis not present

## 2017-05-11 DIAGNOSIS — R509 Fever, unspecified: Secondary | ICD-10-CM | POA: Diagnosis not present

## 2017-05-17 DIAGNOSIS — I10 Essential (primary) hypertension: Secondary | ICD-10-CM | POA: Diagnosis not present

## 2017-05-17 DIAGNOSIS — E1140 Type 2 diabetes mellitus with diabetic neuropathy, unspecified: Secondary | ICD-10-CM | POA: Diagnosis not present

## 2017-05-17 DIAGNOSIS — R509 Fever, unspecified: Secondary | ICD-10-CM | POA: Diagnosis not present

## 2017-05-17 DIAGNOSIS — E785 Hyperlipidemia, unspecified: Secondary | ICD-10-CM | POA: Diagnosis not present

## 2017-05-17 DIAGNOSIS — R05 Cough: Secondary | ICD-10-CM | POA: Diagnosis not present

## 2017-06-09 DIAGNOSIS — Z79899 Other long term (current) drug therapy: Secondary | ICD-10-CM | POA: Diagnosis not present

## 2017-06-09 DIAGNOSIS — R791 Abnormal coagulation profile: Secondary | ICD-10-CM | POA: Diagnosis not present

## 2017-06-09 DIAGNOSIS — Z86711 Personal history of pulmonary embolism: Secondary | ICD-10-CM | POA: Diagnosis not present

## 2017-06-09 DIAGNOSIS — Z5181 Encounter for therapeutic drug level monitoring: Secondary | ICD-10-CM | POA: Diagnosis not present

## 2017-07-07 DIAGNOSIS — Z5181 Encounter for therapeutic drug level monitoring: Secondary | ICD-10-CM | POA: Diagnosis not present

## 2017-07-07 DIAGNOSIS — Z79899 Other long term (current) drug therapy: Secondary | ICD-10-CM | POA: Diagnosis not present

## 2017-07-07 DIAGNOSIS — R791 Abnormal coagulation profile: Secondary | ICD-10-CM | POA: Diagnosis not present

## 2017-07-07 DIAGNOSIS — Z86711 Personal history of pulmonary embolism: Secondary | ICD-10-CM | POA: Diagnosis not present

## 2017-07-11 DIAGNOSIS — Z79899 Other long term (current) drug therapy: Secondary | ICD-10-CM | POA: Diagnosis not present

## 2017-07-11 DIAGNOSIS — Z86711 Personal history of pulmonary embolism: Secondary | ICD-10-CM | POA: Diagnosis not present

## 2017-07-11 DIAGNOSIS — Z5181 Encounter for therapeutic drug level monitoring: Secondary | ICD-10-CM | POA: Diagnosis not present

## 2017-07-11 DIAGNOSIS — R791 Abnormal coagulation profile: Secondary | ICD-10-CM | POA: Diagnosis not present

## 2017-07-21 DIAGNOSIS — Z86711 Personal history of pulmonary embolism: Secondary | ICD-10-CM | POA: Diagnosis not present

## 2017-07-21 DIAGNOSIS — Z5181 Encounter for therapeutic drug level monitoring: Secondary | ICD-10-CM | POA: Diagnosis not present

## 2017-07-21 DIAGNOSIS — Z79899 Other long term (current) drug therapy: Secondary | ICD-10-CM | POA: Diagnosis not present

## 2017-07-21 DIAGNOSIS — R791 Abnormal coagulation profile: Secondary | ICD-10-CM | POA: Diagnosis not present

## 2017-07-28 DIAGNOSIS — G894 Chronic pain syndrome: Secondary | ICD-10-CM | POA: Diagnosis not present

## 2017-07-28 DIAGNOSIS — M545 Low back pain: Secondary | ICD-10-CM | POA: Diagnosis not present

## 2017-08-04 DIAGNOSIS — Z86711 Personal history of pulmonary embolism: Secondary | ICD-10-CM | POA: Diagnosis not present

## 2017-08-04 DIAGNOSIS — Z79899 Other long term (current) drug therapy: Secondary | ICD-10-CM | POA: Diagnosis not present

## 2017-08-04 DIAGNOSIS — R791 Abnormal coagulation profile: Secondary | ICD-10-CM | POA: Diagnosis not present

## 2017-08-04 DIAGNOSIS — Z5181 Encounter for therapeutic drug level monitoring: Secondary | ICD-10-CM | POA: Diagnosis not present

## 2017-09-01 DIAGNOSIS — Z5181 Encounter for therapeutic drug level monitoring: Secondary | ICD-10-CM | POA: Diagnosis not present

## 2017-09-01 DIAGNOSIS — R791 Abnormal coagulation profile: Secondary | ICD-10-CM | POA: Diagnosis not present

## 2017-09-01 DIAGNOSIS — Z86711 Personal history of pulmonary embolism: Secondary | ICD-10-CM | POA: Diagnosis not present

## 2017-09-01 DIAGNOSIS — Z79899 Other long term (current) drug therapy: Secondary | ICD-10-CM | POA: Diagnosis not present

## 2017-09-13 DIAGNOSIS — Z86711 Personal history of pulmonary embolism: Secondary | ICD-10-CM | POA: Diagnosis not present

## 2017-09-13 DIAGNOSIS — Z79899 Other long term (current) drug therapy: Secondary | ICD-10-CM | POA: Diagnosis not present

## 2017-09-13 DIAGNOSIS — Z5181 Encounter for therapeutic drug level monitoring: Secondary | ICD-10-CM | POA: Diagnosis not present

## 2017-09-22 DIAGNOSIS — E785 Hyperlipidemia, unspecified: Secondary | ICD-10-CM | POA: Diagnosis not present

## 2017-09-22 DIAGNOSIS — G629 Polyneuropathy, unspecified: Secondary | ICD-10-CM | POA: Diagnosis not present

## 2017-09-22 DIAGNOSIS — Z720 Tobacco use: Secondary | ICD-10-CM | POA: Diagnosis not present

## 2017-09-22 DIAGNOSIS — E1140 Type 2 diabetes mellitus with diabetic neuropathy, unspecified: Secondary | ICD-10-CM | POA: Diagnosis not present

## 2017-09-22 DIAGNOSIS — I10 Essential (primary) hypertension: Secondary | ICD-10-CM | POA: Diagnosis not present

## 2017-09-26 ENCOUNTER — Other Ambulatory Visit: Payer: Self-pay

## 2017-09-26 DIAGNOSIS — I872 Venous insufficiency (chronic) (peripheral): Secondary | ICD-10-CM

## 2017-10-06 DIAGNOSIS — I10 Essential (primary) hypertension: Secondary | ICD-10-CM | POA: Diagnosis not present

## 2017-10-06 DIAGNOSIS — E1140 Type 2 diabetes mellitus with diabetic neuropathy, unspecified: Secondary | ICD-10-CM | POA: Diagnosis not present

## 2017-11-03 DIAGNOSIS — E1140 Type 2 diabetes mellitus with diabetic neuropathy, unspecified: Secondary | ICD-10-CM | POA: Diagnosis not present

## 2017-11-03 DIAGNOSIS — I10 Essential (primary) hypertension: Secondary | ICD-10-CM | POA: Diagnosis not present

## 2017-11-03 DIAGNOSIS — G629 Polyneuropathy, unspecified: Secondary | ICD-10-CM | POA: Diagnosis not present

## 2017-11-03 DIAGNOSIS — E785 Hyperlipidemia, unspecified: Secondary | ICD-10-CM | POA: Diagnosis not present

## 2017-11-03 DIAGNOSIS — I872 Venous insufficiency (chronic) (peripheral): Secondary | ICD-10-CM | POA: Diagnosis not present

## 2017-12-14 ENCOUNTER — Encounter: Payer: Medicare Other | Admitting: Vascular Surgery

## 2017-12-14 ENCOUNTER — Encounter

## 2017-12-14 ENCOUNTER — Ambulatory Visit (HOSPITAL_COMMUNITY): Payer: Medicare Other

## 2018-01-24 DIAGNOSIS — E785 Hyperlipidemia, unspecified: Secondary | ICD-10-CM | POA: Diagnosis not present

## 2018-01-24 DIAGNOSIS — I10 Essential (primary) hypertension: Secondary | ICD-10-CM | POA: Diagnosis not present

## 2018-01-24 DIAGNOSIS — R3915 Urgency of urination: Secondary | ICD-10-CM | POA: Diagnosis not present

## 2018-01-24 DIAGNOSIS — I872 Venous insufficiency (chronic) (peripheral): Secondary | ICD-10-CM | POA: Diagnosis not present

## 2018-01-24 DIAGNOSIS — E1140 Type 2 diabetes mellitus with diabetic neuropathy, unspecified: Secondary | ICD-10-CM | POA: Diagnosis not present

## 2018-01-25 ENCOUNTER — Ambulatory Visit (INDEPENDENT_AMBULATORY_CARE_PROVIDER_SITE_OTHER): Payer: Medicare Other | Admitting: Podiatry

## 2018-01-25 ENCOUNTER — Other Ambulatory Visit: Payer: Self-pay

## 2018-01-25 ENCOUNTER — Encounter: Payer: Self-pay | Admitting: Podiatry

## 2018-01-25 DIAGNOSIS — E0842 Diabetes mellitus due to underlying condition with diabetic polyneuropathy: Secondary | ICD-10-CM | POA: Diagnosis not present

## 2018-01-25 DIAGNOSIS — B351 Tinea unguium: Secondary | ICD-10-CM | POA: Diagnosis not present

## 2018-01-25 DIAGNOSIS — Z1231 Encounter for screening mammogram for malignant neoplasm of breast: Secondary | ICD-10-CM | POA: Diagnosis not present

## 2018-01-25 NOTE — Patient Outreach (Signed)
[facility]  01/25/2018  Mary HolmsShilda T [name] Mar 28, 1956 [id number]   Medication Adherence call to Mrs. Andres EgeShilda [name] left a message for patient to call back patient is due on Losartan 100 mg. [name] is showing past due under [facility].   Lillia AbedAna Ollison-Moran CPhT Pharmacy Technician [facility] Care Management Direct Dial [phone]  Fax [phone] [email or web address]

## 2018-01-25 NOTE — Patient Instructions (Signed)
Diabetic foot check. Noted of normal neurovascular status. Hypertrophic nails debrided. May return in 3 month or as needed.

## 2018-01-25 NOTE — Progress Notes (Signed)
SUBJECTIVE: 62 y.o. year old female presents for diabetic foot care. Stated that she has neuropathy with pain shooting through the big toes. This morning blood sugar level was 110. Using compression stockings during the day.  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  Review of Systems  Constitutional: Negative.   HENT: Negative.   Eyes: Negative.   Respiratory: Negative.   Cardiovascular: Negative.   Gastrointestinal: Negative.   Genitourinary: Negative.   Musculoskeletal: Positive for back pain and joint pain.  Skin: Negative.      OBJECTIVE: DERMATOLOGIC EXAMINATION: Hypertrophic mycotic nails x 10.   VASCULAR EXAMINATION OF LOWER LIMBS: All pedal pulses are palpable with normal pulsation.  Capillary Filling times within 3 seconds in all digits.  No edema or erythema noted. Temperature gradient from tibial crest to dorsum of foot is within normal bilateral.  NEUROLOGIC EXAMINATION OF THE LOWER LIMBS: Positive of subjective numbness and shooting pain both great toes. Monofilament (Semmes-Weinstein 10-gm) sensory testing positive 6 out of 6, bilateral. Vibratory sensations(128Hz  turning fork) intact at medial and lateral forefoot bilateral.  Sharp and Dull discriminatory sensations at the plantar ball of hallux is intact bilateral.   MUSCULOSKELETAL EXAMINATION: No gross deformities.  ASSESSMENT: Onychomycosis x 10. Diabetic neuropathy.  PLAN: Reviewed findings and available treatment options. All nails debrided. May return in 3 months.

## 2018-02-07 DIAGNOSIS — I872 Venous insufficiency (chronic) (peripheral): Secondary | ICD-10-CM | POA: Diagnosis not present

## 2018-02-07 DIAGNOSIS — E785 Hyperlipidemia, unspecified: Secondary | ICD-10-CM | POA: Diagnosis not present

## 2018-02-07 DIAGNOSIS — Z23 Encounter for immunization: Secondary | ICD-10-CM | POA: Diagnosis not present

## 2018-02-07 DIAGNOSIS — I10 Essential (primary) hypertension: Secondary | ICD-10-CM | POA: Diagnosis not present

## 2018-02-07 DIAGNOSIS — R3915 Urgency of urination: Secondary | ICD-10-CM | POA: Diagnosis not present

## 2018-02-07 DIAGNOSIS — E1140 Type 2 diabetes mellitus with diabetic neuropathy, unspecified: Secondary | ICD-10-CM | POA: Diagnosis not present

## 2018-03-07 DIAGNOSIS — I872 Venous insufficiency (chronic) (peripheral): Secondary | ICD-10-CM | POA: Diagnosis not present

## 2018-03-07 DIAGNOSIS — E1140 Type 2 diabetes mellitus with diabetic neuropathy, unspecified: Secondary | ICD-10-CM | POA: Diagnosis not present

## 2018-03-07 DIAGNOSIS — Z124 Encounter for screening for malignant neoplasm of cervix: Secondary | ICD-10-CM | POA: Diagnosis not present

## 2018-03-07 DIAGNOSIS — E785 Hyperlipidemia, unspecified: Secondary | ICD-10-CM | POA: Diagnosis not present

## 2018-03-07 DIAGNOSIS — I10 Essential (primary) hypertension: Secondary | ICD-10-CM | POA: Diagnosis not present

## 2018-03-23 DIAGNOSIS — I872 Venous insufficiency (chronic) (peripheral): Secondary | ICD-10-CM | POA: Diagnosis not present

## 2018-03-23 DIAGNOSIS — I10 Essential (primary) hypertension: Secondary | ICD-10-CM | POA: Diagnosis not present

## 2018-03-23 DIAGNOSIS — Z0001 Encounter for general adult medical examination with abnormal findings: Secondary | ICD-10-CM | POA: Diagnosis not present

## 2018-03-23 DIAGNOSIS — E785 Hyperlipidemia, unspecified: Secondary | ICD-10-CM | POA: Diagnosis not present

## 2018-03-23 DIAGNOSIS — E1140 Type 2 diabetes mellitus with diabetic neuropathy, unspecified: Secondary | ICD-10-CM | POA: Diagnosis not present

## 2018-03-28 DIAGNOSIS — M5417 Radiculopathy, lumbosacral region: Secondary | ICD-10-CM | POA: Diagnosis not present

## 2018-03-28 DIAGNOSIS — M545 Low back pain: Secondary | ICD-10-CM | POA: Diagnosis not present

## 2018-03-28 DIAGNOSIS — M25552 Pain in left hip: Secondary | ICD-10-CM | POA: Diagnosis not present

## 2018-04-09 DIAGNOSIS — G8929 Other chronic pain: Secondary | ICD-10-CM | POA: Diagnosis not present

## 2018-04-18 DIAGNOSIS — M48061 Spinal stenosis, lumbar region without neurogenic claudication: Secondary | ICD-10-CM | POA: Diagnosis not present

## 2018-04-18 DIAGNOSIS — G8929 Other chronic pain: Secondary | ICD-10-CM | POA: Diagnosis not present

## 2018-04-18 DIAGNOSIS — M5416 Radiculopathy, lumbar region: Secondary | ICD-10-CM | POA: Diagnosis not present

## 2018-04-18 DIAGNOSIS — Z5181 Encounter for therapeutic drug level monitoring: Secondary | ICD-10-CM | POA: Diagnosis not present

## 2018-04-18 DIAGNOSIS — Z79899 Other long term (current) drug therapy: Secondary | ICD-10-CM | POA: Diagnosis not present

## 2018-04-18 DIAGNOSIS — Z79891 Long term (current) use of opiate analgesic: Secondary | ICD-10-CM | POA: Diagnosis not present

## 2018-04-19 DIAGNOSIS — G629 Polyneuropathy, unspecified: Secondary | ICD-10-CM | POA: Diagnosis not present

## 2018-04-19 DIAGNOSIS — E1140 Type 2 diabetes mellitus with diabetic neuropathy, unspecified: Secondary | ICD-10-CM | POA: Diagnosis not present

## 2018-04-19 DIAGNOSIS — I872 Venous insufficiency (chronic) (peripheral): Secondary | ICD-10-CM | POA: Diagnosis not present

## 2018-04-19 DIAGNOSIS — I10 Essential (primary) hypertension: Secondary | ICD-10-CM | POA: Diagnosis not present

## 2018-04-19 DIAGNOSIS — E785 Hyperlipidemia, unspecified: Secondary | ICD-10-CM | POA: Diagnosis not present

## 2018-04-23 DIAGNOSIS — G629 Polyneuropathy, unspecified: Secondary | ICD-10-CM | POA: Diagnosis not present

## 2018-04-23 DIAGNOSIS — I10 Essential (primary) hypertension: Secondary | ICD-10-CM | POA: Diagnosis not present

## 2018-04-23 DIAGNOSIS — E1140 Type 2 diabetes mellitus with diabetic neuropathy, unspecified: Secondary | ICD-10-CM | POA: Diagnosis not present

## 2018-04-23 DIAGNOSIS — I872 Venous insufficiency (chronic) (peripheral): Secondary | ICD-10-CM | POA: Diagnosis not present

## 2018-04-23 DIAGNOSIS — E785 Hyperlipidemia, unspecified: Secondary | ICD-10-CM | POA: Diagnosis not present

## 2018-04-26 ENCOUNTER — Ambulatory Visit: Payer: Medicare Other | Admitting: Podiatry

## 2018-05-04 DIAGNOSIS — M48061 Spinal stenosis, lumbar region without neurogenic claudication: Secondary | ICD-10-CM | POA: Diagnosis not present

## 2018-05-04 DIAGNOSIS — M5416 Radiculopathy, lumbar region: Secondary | ICD-10-CM | POA: Diagnosis not present

## 2018-05-04 DIAGNOSIS — M4310 Spondylolisthesis, site unspecified: Secondary | ICD-10-CM | POA: Diagnosis not present

## 2018-05-14 DIAGNOSIS — E1140 Type 2 diabetes mellitus with diabetic neuropathy, unspecified: Secondary | ICD-10-CM | POA: Diagnosis not present

## 2018-05-14 DIAGNOSIS — G629 Polyneuropathy, unspecified: Secondary | ICD-10-CM | POA: Diagnosis not present

## 2018-05-14 DIAGNOSIS — I872 Venous insufficiency (chronic) (peripheral): Secondary | ICD-10-CM | POA: Diagnosis not present

## 2018-05-14 DIAGNOSIS — E785 Hyperlipidemia, unspecified: Secondary | ICD-10-CM | POA: Diagnosis not present

## 2018-05-14 DIAGNOSIS — I10 Essential (primary) hypertension: Secondary | ICD-10-CM | POA: Diagnosis not present

## 2018-05-16 DIAGNOSIS — M6281 Muscle weakness (generalized): Secondary | ICD-10-CM | POA: Diagnosis not present

## 2018-05-16 DIAGNOSIS — M545 Low back pain: Secondary | ICD-10-CM | POA: Diagnosis not present

## 2018-05-16 DIAGNOSIS — M5432 Sciatica, left side: Secondary | ICD-10-CM | POA: Diagnosis not present

## 2018-05-16 DIAGNOSIS — G8921 Chronic pain due to trauma: Secondary | ICD-10-CM | POA: Diagnosis not present

## 2018-05-18 DIAGNOSIS — M545 Low back pain: Secondary | ICD-10-CM | POA: Diagnosis not present

## 2018-05-18 DIAGNOSIS — G8921 Chronic pain due to trauma: Secondary | ICD-10-CM | POA: Diagnosis not present

## 2018-05-18 DIAGNOSIS — M5432 Sciatica, left side: Secondary | ICD-10-CM | POA: Diagnosis not present

## 2018-05-18 DIAGNOSIS — M6281 Muscle weakness (generalized): Secondary | ICD-10-CM | POA: Diagnosis not present

## 2018-05-24 DIAGNOSIS — M62838 Other muscle spasm: Secondary | ICD-10-CM | POA: Diagnosis not present

## 2018-05-24 DIAGNOSIS — M6281 Muscle weakness (generalized): Secondary | ICD-10-CM | POA: Diagnosis not present

## 2018-05-24 DIAGNOSIS — R278 Other lack of coordination: Secondary | ICD-10-CM | POA: Diagnosis not present

## 2018-05-25 DIAGNOSIS — M5432 Sciatica, left side: Secondary | ICD-10-CM | POA: Diagnosis not present

## 2018-05-25 DIAGNOSIS — M545 Low back pain: Secondary | ICD-10-CM | POA: Diagnosis not present

## 2018-05-25 DIAGNOSIS — M6281 Muscle weakness (generalized): Secondary | ICD-10-CM | POA: Diagnosis not present

## 2018-05-25 DIAGNOSIS — G8921 Chronic pain due to trauma: Secondary | ICD-10-CM | POA: Diagnosis not present

## 2018-05-30 DIAGNOSIS — M6281 Muscle weakness (generalized): Secondary | ICD-10-CM | POA: Diagnosis not present

## 2018-05-30 DIAGNOSIS — R278 Other lack of coordination: Secondary | ICD-10-CM | POA: Diagnosis not present

## 2018-05-30 DIAGNOSIS — M62838 Other muscle spasm: Secondary | ICD-10-CM | POA: Diagnosis not present

## 2018-05-30 DIAGNOSIS — M5416 Radiculopathy, lumbar region: Secondary | ICD-10-CM | POA: Diagnosis not present

## 2018-05-30 DIAGNOSIS — Z79891 Long term (current) use of opiate analgesic: Secondary | ICD-10-CM | POA: Diagnosis not present

## 2018-05-30 DIAGNOSIS — M48061 Spinal stenosis, lumbar region without neurogenic claudication: Secondary | ICD-10-CM | POA: Diagnosis not present

## 2018-05-31 DIAGNOSIS — M6281 Muscle weakness (generalized): Secondary | ICD-10-CM | POA: Diagnosis not present

## 2018-05-31 DIAGNOSIS — G8921 Chronic pain due to trauma: Secondary | ICD-10-CM | POA: Diagnosis not present

## 2018-05-31 DIAGNOSIS — M545 Low back pain: Secondary | ICD-10-CM | POA: Diagnosis not present

## 2018-05-31 DIAGNOSIS — M5432 Sciatica, left side: Secondary | ICD-10-CM | POA: Diagnosis not present

## 2018-06-05 DIAGNOSIS — M62838 Other muscle spasm: Secondary | ICD-10-CM | POA: Diagnosis not present

## 2018-06-05 DIAGNOSIS — R278 Other lack of coordination: Secondary | ICD-10-CM | POA: Diagnosis not present

## 2018-06-05 DIAGNOSIS — M6281 Muscle weakness (generalized): Secondary | ICD-10-CM | POA: Diagnosis not present

## 2018-06-07 DIAGNOSIS — G8921 Chronic pain due to trauma: Secondary | ICD-10-CM | POA: Diagnosis not present

## 2018-06-07 DIAGNOSIS — M6281 Muscle weakness (generalized): Secondary | ICD-10-CM | POA: Diagnosis not present

## 2018-06-07 DIAGNOSIS — M5432 Sciatica, left side: Secondary | ICD-10-CM | POA: Diagnosis not present

## 2018-06-07 DIAGNOSIS — M545 Low back pain: Secondary | ICD-10-CM | POA: Diagnosis not present

## 2018-06-08 DIAGNOSIS — G8921 Chronic pain due to trauma: Secondary | ICD-10-CM | POA: Diagnosis not present

## 2018-06-08 DIAGNOSIS — M5432 Sciatica, left side: Secondary | ICD-10-CM | POA: Diagnosis not present

## 2018-06-08 DIAGNOSIS — M545 Low back pain: Secondary | ICD-10-CM | POA: Diagnosis not present

## 2018-06-08 DIAGNOSIS — M6281 Muscle weakness (generalized): Secondary | ICD-10-CM | POA: Diagnosis not present

## 2018-06-11 DIAGNOSIS — G629 Polyneuropathy, unspecified: Secondary | ICD-10-CM | POA: Diagnosis not present

## 2018-06-11 DIAGNOSIS — I872 Venous insufficiency (chronic) (peripheral): Secondary | ICD-10-CM | POA: Diagnosis not present

## 2018-06-11 DIAGNOSIS — E785 Hyperlipidemia, unspecified: Secondary | ICD-10-CM | POA: Diagnosis not present

## 2018-06-11 DIAGNOSIS — E1140 Type 2 diabetes mellitus with diabetic neuropathy, unspecified: Secondary | ICD-10-CM | POA: Diagnosis not present

## 2018-06-11 DIAGNOSIS — I10 Essential (primary) hypertension: Secondary | ICD-10-CM | POA: Diagnosis not present

## 2018-06-12 DIAGNOSIS — M545 Low back pain: Secondary | ICD-10-CM | POA: Diagnosis not present

## 2018-06-12 DIAGNOSIS — M5432 Sciatica, left side: Secondary | ICD-10-CM | POA: Diagnosis not present

## 2018-06-12 DIAGNOSIS — M6281 Muscle weakness (generalized): Secondary | ICD-10-CM | POA: Diagnosis not present

## 2018-06-12 DIAGNOSIS — G8921 Chronic pain due to trauma: Secondary | ICD-10-CM | POA: Diagnosis not present

## 2018-06-20 DIAGNOSIS — M62838 Other muscle spasm: Secondary | ICD-10-CM | POA: Diagnosis not present

## 2018-06-20 DIAGNOSIS — R278 Other lack of coordination: Secondary | ICD-10-CM | POA: Diagnosis not present

## 2018-06-20 DIAGNOSIS — M6281 Muscle weakness (generalized): Secondary | ICD-10-CM | POA: Diagnosis not present

## 2018-06-26 DIAGNOSIS — G8921 Chronic pain due to trauma: Secondary | ICD-10-CM | POA: Diagnosis not present

## 2018-06-26 DIAGNOSIS — M5432 Sciatica, left side: Secondary | ICD-10-CM | POA: Diagnosis not present

## 2018-06-26 DIAGNOSIS — M6281 Muscle weakness (generalized): Secondary | ICD-10-CM | POA: Diagnosis not present

## 2018-06-26 DIAGNOSIS — M545 Low back pain: Secondary | ICD-10-CM | POA: Diagnosis not present

## 2018-07-03 DIAGNOSIS — M62838 Other muscle spasm: Secondary | ICD-10-CM | POA: Diagnosis not present

## 2018-07-03 DIAGNOSIS — R278 Other lack of coordination: Secondary | ICD-10-CM | POA: Diagnosis not present

## 2018-07-03 DIAGNOSIS — M6281 Muscle weakness (generalized): Secondary | ICD-10-CM | POA: Diagnosis not present

## 2018-07-23 DIAGNOSIS — I10 Essential (primary) hypertension: Secondary | ICD-10-CM | POA: Diagnosis not present

## 2018-07-23 DIAGNOSIS — I872 Venous insufficiency (chronic) (peripheral): Secondary | ICD-10-CM | POA: Diagnosis not present

## 2018-07-23 DIAGNOSIS — G629 Polyneuropathy, unspecified: Secondary | ICD-10-CM | POA: Diagnosis not present

## 2018-07-23 DIAGNOSIS — E1140 Type 2 diabetes mellitus with diabetic neuropathy, unspecified: Secondary | ICD-10-CM | POA: Diagnosis not present

## 2018-07-23 DIAGNOSIS — E785 Hyperlipidemia, unspecified: Secondary | ICD-10-CM | POA: Diagnosis not present

## 2018-08-03 DIAGNOSIS — G629 Polyneuropathy, unspecified: Secondary | ICD-10-CM | POA: Diagnosis not present

## 2018-08-03 DIAGNOSIS — I872 Venous insufficiency (chronic) (peripheral): Secondary | ICD-10-CM | POA: Diagnosis not present

## 2018-08-03 DIAGNOSIS — I10 Essential (primary) hypertension: Secondary | ICD-10-CM | POA: Diagnosis not present

## 2018-08-03 DIAGNOSIS — E1140 Type 2 diabetes mellitus with diabetic neuropathy, unspecified: Secondary | ICD-10-CM | POA: Diagnosis not present

## 2018-08-20 DIAGNOSIS — E785 Hyperlipidemia, unspecified: Secondary | ICD-10-CM | POA: Diagnosis not present

## 2018-08-20 DIAGNOSIS — G629 Polyneuropathy, unspecified: Secondary | ICD-10-CM | POA: Diagnosis not present

## 2018-08-20 DIAGNOSIS — E1140 Type 2 diabetes mellitus with diabetic neuropathy, unspecified: Secondary | ICD-10-CM | POA: Diagnosis not present

## 2018-08-20 DIAGNOSIS — I872 Venous insufficiency (chronic) (peripheral): Secondary | ICD-10-CM | POA: Diagnosis not present

## 2018-08-20 DIAGNOSIS — I10 Essential (primary) hypertension: Secondary | ICD-10-CM | POA: Diagnosis not present

## 2018-09-18 DIAGNOSIS — M25532 Pain in left wrist: Secondary | ICD-10-CM | POA: Diagnosis not present

## 2018-10-04 DIAGNOSIS — M65832 Other synovitis and tenosynovitis, left forearm: Secondary | ICD-10-CM | POA: Diagnosis not present

## 2018-10-04 DIAGNOSIS — M25532 Pain in left wrist: Secondary | ICD-10-CM | POA: Diagnosis not present

## 2018-10-17 DIAGNOSIS — I10 Essential (primary) hypertension: Secondary | ICD-10-CM | POA: Diagnosis not present

## 2018-10-17 DIAGNOSIS — Z136 Encounter for screening for cardiovascular disorders: Secondary | ICD-10-CM | POA: Diagnosis not present

## 2018-10-17 DIAGNOSIS — E1140 Type 2 diabetes mellitus with diabetic neuropathy, unspecified: Secondary | ICD-10-CM | POA: Diagnosis not present

## 2018-10-17 DIAGNOSIS — Z0000 Encounter for general adult medical examination without abnormal findings: Secondary | ICD-10-CM | POA: Diagnosis not present

## 2018-10-17 DIAGNOSIS — Z0100 Encounter for examination of eyes and vision without abnormal findings: Secondary | ICD-10-CM | POA: Diagnosis not present

## 2018-11-08 DIAGNOSIS — M65832 Other synovitis and tenosynovitis, left forearm: Secondary | ICD-10-CM | POA: Diagnosis not present

## 2018-11-08 DIAGNOSIS — M25532 Pain in left wrist: Secondary | ICD-10-CM | POA: Diagnosis not present

## 2018-12-03 DIAGNOSIS — I10 Essential (primary) hypertension: Secondary | ICD-10-CM | POA: Diagnosis not present

## 2018-12-03 DIAGNOSIS — E1140 Type 2 diabetes mellitus with diabetic neuropathy, unspecified: Secondary | ICD-10-CM | POA: Diagnosis not present

## 2018-12-03 DIAGNOSIS — I872 Venous insufficiency (chronic) (peripheral): Secondary | ICD-10-CM | POA: Diagnosis not present

## 2018-12-03 DIAGNOSIS — G629 Polyneuropathy, unspecified: Secondary | ICD-10-CM | POA: Diagnosis not present

## 2018-12-03 DIAGNOSIS — E785 Hyperlipidemia, unspecified: Secondary | ICD-10-CM | POA: Diagnosis not present

## 2018-12-03 DIAGNOSIS — R350 Frequency of micturition: Secondary | ICD-10-CM | POA: Diagnosis not present

## 2019-03-08 ENCOUNTER — Other Ambulatory Visit: Payer: Self-pay

## 2019-03-08 NOTE — Patient Outreach (Signed)
[facility]) Care Management  03/08/2019  DEMARIE UHLIG 1955-07-11 [id number]   Medication Adherence call to Mrs. [place] Compliant Voice message left with a call back number. Mrs. Medel is showing past due on Rosuvastatin 20 mg under [place].   [facility] Management Direct Dial [phone]  Fax [phone] [email or web address]

## 2019-03-18 ENCOUNTER — Other Ambulatory Visit: Payer: Self-pay

## 2019-03-18 NOTE — Patient Outreach (Signed)
[facility]) Care Management  03/18/2019  CRISTI GWYNN Nov 27, 1955 [id number]   Medication Adherence call to Mrs. Manuel Garcia Compliant Voice message left with a call back number. Mrs. Mickle is showing past due on Rosuvastatin 20 mg under [place].   [facility] Management Direct Dial [phone]  Fax [phone] [email or web address]

## 2019-03-22 DIAGNOSIS — E1140 Type 2 diabetes mellitus with diabetic neuropathy, unspecified: Secondary | ICD-10-CM | POA: Diagnosis not present

## 2019-03-22 DIAGNOSIS — E785 Hyperlipidemia, unspecified: Secondary | ICD-10-CM | POA: Diagnosis not present

## 2019-03-22 DIAGNOSIS — I872 Venous insufficiency (chronic) (peripheral): Secondary | ICD-10-CM | POA: Diagnosis not present

## 2019-03-22 DIAGNOSIS — I10 Essential (primary) hypertension: Secondary | ICD-10-CM | POA: Diagnosis not present

## 2019-03-22 DIAGNOSIS — R197 Diarrhea, unspecified: Secondary | ICD-10-CM | POA: Diagnosis not present

## 2019-03-25 DIAGNOSIS — R197 Diarrhea, unspecified: Secondary | ICD-10-CM | POA: Diagnosis not present

## 2019-04-02 ENCOUNTER — Other Ambulatory Visit: Payer: Self-pay | Admitting: Cardiology

## 2019-04-02 DIAGNOSIS — Z20822 Contact with and (suspected) exposure to covid-19: Secondary | ICD-10-CM

## 2019-04-04 LAB — NOVEL CORONAVIRUS, NAA: SARS-CoV-2, NAA: NOT DETECTED

## 2019-04-08 ENCOUNTER — Telehealth: Payer: Self-pay | Admitting: *Deleted

## 2019-04-08 NOTE — Telephone Encounter (Signed)
Patient called and was given negative covid results . 

## 2019-04-11 DIAGNOSIS — G894 Chronic pain syndrome: Secondary | ICD-10-CM | POA: Diagnosis not present

## 2019-04-11 DIAGNOSIS — M4310 Spondylolisthesis, site unspecified: Secondary | ICD-10-CM | POA: Diagnosis not present

## 2019-05-22 DIAGNOSIS — I10 Essential (primary) hypertension: Secondary | ICD-10-CM | POA: Diagnosis not present

## 2019-05-22 DIAGNOSIS — I872 Venous insufficiency (chronic) (peripheral): Secondary | ICD-10-CM | POA: Diagnosis not present

## 2019-05-22 DIAGNOSIS — G629 Polyneuropathy, unspecified: Secondary | ICD-10-CM | POA: Diagnosis not present

## 2019-05-22 DIAGNOSIS — E1140 Type 2 diabetes mellitus with diabetic neuropathy, unspecified: Secondary | ICD-10-CM | POA: Diagnosis not present

## 2019-05-22 DIAGNOSIS — E785 Hyperlipidemia, unspecified: Secondary | ICD-10-CM | POA: Diagnosis not present

## 2019-06-12 DIAGNOSIS — Z5181 Encounter for therapeutic drug level monitoring: Secondary | ICD-10-CM | POA: Diagnosis not present

## 2019-06-12 DIAGNOSIS — M25552 Pain in left hip: Secondary | ICD-10-CM | POA: Diagnosis not present

## 2019-06-12 DIAGNOSIS — Z79899 Other long term (current) drug therapy: Secondary | ICD-10-CM | POA: Diagnosis not present

## 2019-07-04 DIAGNOSIS — M545 Low back pain: Secondary | ICD-10-CM | POA: Diagnosis not present

## 2019-07-04 DIAGNOSIS — M25552 Pain in left hip: Secondary | ICD-10-CM | POA: Diagnosis not present

## 2019-07-04 DIAGNOSIS — Z79891 Long term (current) use of opiate analgesic: Secondary | ICD-10-CM | POA: Diagnosis not present

## 2019-07-04 DIAGNOSIS — G894 Chronic pain syndrome: Secondary | ICD-10-CM | POA: Diagnosis not present

## 2019-09-04 DIAGNOSIS — G894 Chronic pain syndrome: Secondary | ICD-10-CM | POA: Diagnosis not present

## 2019-09-04 DIAGNOSIS — Z5181 Encounter for therapeutic drug level monitoring: Secondary | ICD-10-CM | POA: Diagnosis not present

## 2019-09-04 DIAGNOSIS — Z79899 Other long term (current) drug therapy: Secondary | ICD-10-CM | POA: Diagnosis not present

## 2019-09-04 DIAGNOSIS — I10 Essential (primary) hypertension: Secondary | ICD-10-CM | POA: Diagnosis not present

## 2019-09-11 DIAGNOSIS — I872 Venous insufficiency (chronic) (peripheral): Secondary | ICD-10-CM | POA: Diagnosis not present

## 2019-09-11 DIAGNOSIS — G629 Polyneuropathy, unspecified: Secondary | ICD-10-CM | POA: Diagnosis not present

## 2019-09-11 DIAGNOSIS — I10 Essential (primary) hypertension: Secondary | ICD-10-CM | POA: Diagnosis not present

## 2019-09-11 DIAGNOSIS — E1140 Type 2 diabetes mellitus with diabetic neuropathy, unspecified: Secondary | ICD-10-CM | POA: Diagnosis not present

## 2019-09-11 DIAGNOSIS — E785 Hyperlipidemia, unspecified: Secondary | ICD-10-CM | POA: Diagnosis not present

## 2019-10-24 ENCOUNTER — Ambulatory Visit (INDEPENDENT_AMBULATORY_CARE_PROVIDER_SITE_OTHER): Payer: Medicare Other | Admitting: Pulmonary Disease

## 2019-10-24 ENCOUNTER — Encounter: Payer: Self-pay | Admitting: Pulmonary Disease

## 2019-10-24 ENCOUNTER — Other Ambulatory Visit: Payer: Self-pay

## 2019-10-24 DIAGNOSIS — J449 Chronic obstructive pulmonary disease, unspecified: Secondary | ICD-10-CM

## 2019-10-24 DIAGNOSIS — G4733 Obstructive sleep apnea (adult) (pediatric): Secondary | ICD-10-CM | POA: Diagnosis not present

## 2019-10-24 NOTE — Assessment & Plan Note (Signed)
Schedule home sleep test Based on this, we may get you started on CPAP machine Pretest probability is high  Given excessive daytime somnolence, narrow pharyngeal exam, witnessed apneas & loud snoring, obstructive sleep apnea is very likely & an overnight polysomnogram will be scheduled as a home study. The pathophysiology of obstructive sleep apnea , it's cardiovascular consequences & modes of treatment including CPAP were discused with the patient in detail & they evidenced understanding.

## 2019-10-24 NOTE — Progress Notes (Signed)
Subjective:    Patient ID: [name], female    DOB: [birth year], 64 y.o.   MRN: [id number]  HPI   Chief Complaint  Patient presents with  . Consult    pt used cpap 10 or more years ago.pt states blood pressure stays high.pt states 3-4 times a night up .pt states snoring and falling asleep easily   64 year old diabetic, hypertensive, smoker presents for evaluation of COPD and OSA.  Referral notes reviewed from PCP. She had sleep study 10 years ago and was told she had OSA, had a CPAP machine but did not really settled down with this, this was damaged and she never obtained a new machine. She now has been experiencing increased hypertension and nonrefreshing sleep with frequent awakenings.  She also reports excessive daytime tiredness and unintended naps. She is frequently nodding off during the day. Epworth sleepiness score is 15 and she reports sleepiness while sitting and reading, watching TV, sitting inactive in a public place or as a passenger in a car. Bedtime is her 9 PM, sleep latency has always been long, sleeps on her side with 2 pillows, reports 3-4 nocturnal awakenings and is out of bed at 8 AM feeling tired with headaches and dryness of mouth. She has gained 20 pounds in the last 2 years. She lives with her mom who has Alzheimer's and is also on a CPAP machine.  She smokes about half pack per day, more than 50 pack years starting as a teenager.  Is maintained on Advair and Combivent as needed  PMH -diabetes, hypertension, DVT x2, last 2010, on lifelong Xarelto. Chronic back pain-was on opiates for 14 years until stopped 2021, now on CBD and Tylenol   No past medical history on file.  Allergies  Allergen Reactions  . Codeine     REACTION: itch  . Codeine Phosphate     REACTION: itching  . Tramadol-Acetaminophen     REACTION: itch    Social History   Socioeconomic History  . Marital status: Single    Spouse name: Not on file  . Number of children: Not  on file  . Years of education: Not on file  . Highest education level: Not on file  Occupational History  . Not on file  Tobacco Use  . Smoking status: Current Every Day Smoker    Packs/day: 0.50  . Smokeless tobacco: Never Used  Substance and Sexual Activity  . Alcohol use: Not on file  . Drug use: Not on file  . Sexual activity: Not on file  Other Topics Concern  . Not on file  Social History Narrative  . Not on file   Social Determinants of Health   Financial Resource Strain:   . Difficulty of Paying Living Expenses:   Food Insecurity:   . Worried About Programme researcher, broadcasting/film/video in the Last Year:   . Barista in the Last Year:   Transportation Needs:   . Freight forwarder (Medical):   Marland Kitchen Lack of Transportation (Non-Medical):   Physical Activity:   . Days of Exercise per Week:   . Minutes of Exercise per Session:   Stress:   . Feeling of Stress :   Social Connections:   . Frequency of Communication with Friends and Family:   . Frequency of Social Gatherings with Friends and Family:   . Attends Religious Services:   . Active Member of Clubs or Organizations:   . Attends Banker Meetings:   .  Marital Status:   Intimate Partner Violence:   . Fear of Current or Ex-Partner:   . Emotionally Abused:   Marland Kitchen Physically Abused:   . Sexually Abused:      History reviewed. No pertinent family history.   Review of Systems  Constitutional: negative for anorexia, fevers and sweats  Eyes: negative for irritation, redness and visual disturbance  Ears, nose, mouth, throat, and face: negative for earaches, epistaxis, nasal congestion and sore throat  Respiratory: negative for cough,  sputum and wheezing  Cardiovascular: negative for chest pain,  lower extremity edema, orthopnea, palpitations and syncope  Gastrointestinal: negative for abdominal pain, constipation, diarrhea, melena, nausea and vomiting + heartburn and indigestion Genitourinary:negative for  dysuria, frequency and hematuria  Hematologic/lymphatic: negative for bleeding, easy bruising and lymphadenopathy  Musculoskeletal:negative for arthralgias, muscle weakness  + stiff joints  Neurological: negative for coordination problems, gait problems, headaches and weakness  Endocrine: negative for diabetic symptoms including polydipsia, polyuria and weight loss     Objective:   Physical Exam   Gen. Pleasant, obese, in no distress, normal affect ENT - no pallor,icterus, no post nasal drip, class 2 airway Neck: No JVD, no thyromegaly, no carotid bruits Lungs: no use of accessory muscles, no dullness to percussion, decreased without rales or rhonchi  Cardiovascular: Rhythm regular, heart sounds  normal, no murmurs or gallops, no peripheral edema Abdomen: soft and non-tender, no hepatosplenomegaly, BS normal. Musculoskeletal: No deformities, no cyanosis or clubbing Neuro:  alert, non focal, no tremors        Assessment & Plan:

## 2019-10-24 NOTE — Patient Instructions (Signed)
  Schedule home sleep test Based on this, we may get you started on CPAP machine  Schedule PFTs You have to quit smoking!. Refer to lung cancer screening program

## 2019-10-24 NOTE — Assessment & Plan Note (Signed)
Schedule PFTs Smoking cessation discussed, Chantix prescription has been provided Refer to lung cancer screening program

## 2019-11-15 DIAGNOSIS — E1140 Type 2 diabetes mellitus with diabetic neuropathy, unspecified: Secondary | ICD-10-CM | POA: Diagnosis not present

## 2019-11-15 DIAGNOSIS — I872 Venous insufficiency (chronic) (peripheral): Secondary | ICD-10-CM | POA: Diagnosis not present

## 2019-11-15 DIAGNOSIS — G629 Polyneuropathy, unspecified: Secondary | ICD-10-CM | POA: Diagnosis not present

## 2019-11-15 DIAGNOSIS — I10 Essential (primary) hypertension: Secondary | ICD-10-CM | POA: Diagnosis not present

## 2019-11-15 DIAGNOSIS — E785 Hyperlipidemia, unspecified: Secondary | ICD-10-CM | POA: Diagnosis not present

## 2019-11-28 DIAGNOSIS — Z1231 Encounter for screening mammogram for malignant neoplasm of breast: Secondary | ICD-10-CM | POA: Diagnosis not present

## 2019-11-29 ENCOUNTER — Telehealth: Payer: Self-pay | Admitting: Pulmonary Disease

## 2019-11-29 NOTE — Telephone Encounter (Signed)
Patient r/s HST to 7/29 @ 2:00 PM w/ Synetta Fail.

## 2019-12-05 DIAGNOSIS — G4733 Obstructive sleep apnea (adult) (pediatric): Secondary | ICD-10-CM | POA: Diagnosis not present

## 2019-12-06 ENCOUNTER — Ambulatory Visit (INDEPENDENT_AMBULATORY_CARE_PROVIDER_SITE_OTHER): Payer: Medicare Other | Admitting: Pulmonary Disease

## 2019-12-06 ENCOUNTER — Other Ambulatory Visit: Payer: Self-pay

## 2019-12-06 ENCOUNTER — Ambulatory Visit: Payer: Medicare Other

## 2019-12-06 ENCOUNTER — Encounter: Payer: Self-pay | Admitting: Pulmonary Disease

## 2019-12-06 VITALS — BP 128/84 | HR 78 | Temp 98.3°F | Ht 64.0 in | Wt 244.4 lb

## 2019-12-06 DIAGNOSIS — Z0000 Encounter for general adult medical examination without abnormal findings: Secondary | ICD-10-CM | POA: Insufficient documentation

## 2019-12-06 DIAGNOSIS — J449 Chronic obstructive pulmonary disease, unspecified: Secondary | ICD-10-CM

## 2019-12-06 DIAGNOSIS — G4733 Obstructive sleep apnea (adult) (pediatric): Secondary | ICD-10-CM

## 2019-12-06 DIAGNOSIS — F17200 Nicotine dependence, unspecified, uncomplicated: Secondary | ICD-10-CM

## 2019-12-06 DIAGNOSIS — Z23 Encounter for immunization: Secondary | ICD-10-CM

## 2019-12-06 LAB — PULMONARY FUNCTION TEST
DL/VA % pred: 95 %
DL/VA: 3.97 ml/min/mmHg/L
DLCO cor % pred: 93 %
DLCO cor: 18.6 ml/min/mmHg
DLCO unc % pred: 93 %
DLCO unc: 18.6 ml/min/mmHg
FEF 25-75 Post: 3.57 L/sec
FEF 25-75 Pre: 3.14 L/sec
FEF2575-%Change-Post: 13 %
FEF2575-%Pred-Post: 185 %
FEF2575-%Pred-Pre: 163 %
FEV1-%Change-Post: 2 %
FEV1-%Pred-Post: 124 %
FEV1-%Pred-Pre: 121 %
FEV1-Post: 2.48 L
FEV1-Pre: 2.42 L
FEV1FVC-%Change-Post: 2 %
FEV1FVC-%Pred-Pre: 107 %
FEV6-%Change-Post: 1 %
FEV6-%Pred-Post: 116 %
FEV6-%Pred-Pre: 115 %
FEV6-Post: 2.87 L
FEV6-Pre: 2.83 L
FEV6FVC-%Change-Post: 0 %
FEV6FVC-%Pred-Post: 103 %
FEV6FVC-%Pred-Pre: 102 %
FVC-%Change-Post: 0 %
FVC-%Pred-Post: 112 %
FVC-%Pred-Pre: 112 %
FVC-Post: 2.87 L
FVC-Pre: 2.86 L
Post FEV1/FVC ratio: 86 %
Post FEV6/FVC ratio: 100 %
Pre FEV1/FVC ratio: 85 %
Pre FEV6/FVC Ratio: 99 %
RV % pred: 76 %
RV: 1.59 L
TLC % pred: 90 %
TLC: 4.57 L

## 2019-12-06 MED ORDER — NICOTINE 14 MG/24HR TD PT24: 14.0000 mg | MEDICATED_PATCH | Freq: Every day | TRANSDERMAL | 3 refills | Status: DC

## 2019-12-06 MED ORDER — STIOLTO RESPIMAT 2.5-2.5 MCG/ACT IN AERS: 2.0000 | INHALATION_SPRAY | Freq: Every day | RESPIRATORY_TRACT | 0 refills | Status: DC

## 2019-12-06 NOTE — Progress Notes (Signed)
PFT done today. 

## 2019-12-06 NOTE — Assessment & Plan Note (Signed)
At risk for obstructive sleep apnea  Plan: Worked with patient care coordinator so patient could take home sleep study equipment home with the appointment Patient aware she needs to bring back to our office on Monday morning at 9 AM

## 2019-12-06 NOTE — Progress Notes (Signed)
'@Patient'  ID: Reggy Eye, female    DOB: [birth year], 64 y.o.   MRN: [id number]  Chief Complaint  Patient presents with  . Follow-up    pt is here to go over pft results    Referring provider: Benito Mccreedy, MD  HPI:  64 year old female current smoker followed in our office for COPD and obstructive sleep apnea  PMH: type 2 diabetes, hyperlipidemia, GERD Smoker/ Smoking History: Current smoker Maintenance: Wixela 250 Pt of: Dr. Elsworth Soho  12/06/2019  - Visit   64 year old female current everyday smoker. Initially consulted with our practice in June/2021.  She establish care with Dr. Elsworth Soho.  Patient felt to have suspected COPD and suspected obstructive sleep apnea.  Plan of care from office visit on 10/24/2019 was outlined as below: Emphasized need to stop smoking, Chantix prescription was provided, schedule pulmonary function testing, referral to lung cancer screening program, home sleep study ordered, follow-up in 3 months.  She is still currently smoking 5 to 10 cigarettes a day.  She is been taking Chantix for over a year.  She has not yet set a quit date.  We will discuss this today.  She is currently prescribed Wixela.  She reports she has not used this for over 3 months.  She is unsure if this is benefiting her.  She has noticed that she has had to increase her rescue inhaler use to around 1-2 times a day.  She is due for Pneumovax 23.  We will discuss this today.  Pulmonary function testing results listed below:  12/06/2019-pulmonary function test-FVC 2.86 (112% predicted), postbronchodilator ratio 86, postbronchodilator FEV1 2.48 (124% predicted), no bronchodilator response, DLCO 18.6 (93% addicted)  Patient reporting today that she is close to be picking up home sleep study equipment today at 2 PM.  We will see if we are able to coordinate this to be a sooner appointment for the patient.  Questionaires / Pulmonary Flowsheets:   ACT:  No flowsheet data  found.  MMRC: mMRC Dyspnea Scale mMRC Score  12/06/2019 2    Epworth:  Results of the Epworth flowsheet 10/24/2019  Sitting and reading 3  Watching TV 3  Sitting, inactive in a public place (e.g. a theatre or a meeting) 3  As a passenger in a car for an hour without a break 3  Lying down to rest in the afternoon when circumstances permit 3  Sitting and talking to someone 3  Sitting quietly after a lunch without alcohol 0  In a car, while stopped for a few minutes in traffic 0  Total score 18    Tests:   FENO:  No results found for: NITRICOXIDE  PFT: PFT Results Latest Ref Rng & Units 12/06/2019  FVC-Pre L 2.86  FVC-Predicted Pre % 112  FVC-Post L 2.87  FVC-Predicted Post % 112  Pre FEV1/FVC % % 85  Post FEV1/FCV % % 86  FEV1-Pre L 2.42  FEV1-Predicted Pre % 121  FEV1-Post L 2.48  DLCO uncorrected ml/min/mmHg 18.60  DLCO UNC% % 93  DLCO corrected ml/min/mmHg 18.60  DLCO COR %Predicted % 93  DLVA Predicted % 95  TLC L 4.57  TLC % Predicted % 90  RV % Predicted % 76    WALK:  No flowsheet data found.  Imaging: No results found.  Lab Results:  CBC    Component Value Date/Time   WBC 5.1 11/28/2007 0450   RBC 3.35 (L) 11/28/2007 0450   HGB 10.3 (L) 11/28/2007 0450   HCT 30.4 (  L) 11/28/2007 0450   PLT 168 11/28/2007 0450   MCV 90.7 11/28/2007 0450   MCHC 33.8 11/28/2007 0450   RDW 12.6 11/28/2007 0450   LYMPHSABS 1.7 11/20/2007 1210   MONOABS 0.3 11/20/2007 1210   EOSABS 0.1 11/20/2007 1210   BASOSABS 0.1 11/20/2007 1210    BMET    Component Value Date/Time   NA 138 11/28/2007 0450   K 3.6 11/28/2007 0450   CL 107 11/28/2007 0450   CO2 27 11/28/2007 0450   GLUCOSE 100 (H) 11/28/2007 0450   BUN 8 11/28/2007 0450   CREATININE 0.66 11/28/2007 0450   CALCIUM 8.3 (L) 11/28/2007 0450   GFRNONAA >60 11/28/2007 0450   GFRAA  11/28/2007 0450    >60        The eGFR has been calculated using the MDRD equation. This calculation has not been validated  in all clinical    BNP No results found for: BNP  ProBNP No results found for: PROBNP  Specialty Problems      Pulmonary Problems   COPD with chronic bronchitis and emphysema (HCC)    Qualifier: Diagnosis of  By: Larose Kells MD, Jose E.       OSA (obstructive sleep apnea)      Allergies  Allergen Reactions  . Codeine     REACTION: itch  . Codeine Phosphate     REACTION: itching  . Tramadol-Acetaminophen     REACTION: itch    Immunization History  Administered Date(s) Administered  . Influenza Whole 04/13/2007  . Influenza-Unspecified 02/25/2019  . PFIZER SARS-COV-2 Vaccination 07/09/2019, 08/28/2019  . Pneumococcal Polysaccharide-23 04/13/2007, 12/06/2019  . Td 07/23/2007    History reviewed. No pertinent past medical history.  Tobacco History: Social History   Tobacco Use  Smoking Status Current Every Day Smoker  . Packs/day: 1.50  . Years: 55.00  . Pack years: 82.50  . Start date: 06/07/1964  Smokeless Tobacco Never Used   Ready to quit: Not Answered Counseling given: Not Answered   Continue to not smoke  Outpatient Encounter Medications as of 12/06/2019  Medication Sig  . albuterol (VENTOLIN HFA) 108 (90 Base) MCG/ACT inhaler albuterol sulfate HFA 90 mcg/actuation aerosol inhaler  . amLODipine (NORVASC) 10 MG tablet amlodipine 10 mg tablet  . ARIPiprazole (ABILIFY) 5 MG tablet aripiprazole 5 mg tablet  . aspirin 325 MG EC tablet 1 tablet  . celecoxib (CELEBREX) 200 MG capsule celecoxib 200 mg capsule  TAKE 1 CAPSULE BY MOUTH TWICE A DAY  . CHANTIX CONTINUING MONTH PAK 1 MG tablet Take 1 mg by mouth 2 (two) times daily.  . cloNIDine (CATAPRES) 0.1 MG tablet clonidine HCl 0.1 mg tablet  . esomeprazole (NEXIUM) 40 MG capsule esomeprazole magnesium 40 mg capsule,delayed release  TAKE 1 CAPSULE BY MOUTH EVERY DAY  . Fluticasone-Salmeterol (WIXELA INHUB) 250-50 MCG/DOSE AEPB Wixela Inhub 250 mcg-50 mcg/dose powder for inhalation  . furosemide (LASIX) 40 MG  tablet furosemide 40 mg tablet  . glimepiride (AMARYL) 2 MG tablet 1 TAB(S) ONCE A DAY ORALLY 30 DAY(S)  . hydrALAZINE (APRESOLINE) 25 MG tablet hydralazine 25 mg tablet  . Ipratropium-Albuterol (COMBIVENT RESPIMAT) 20-100 MCG/ACT AERS respimat Combivent Respimat 20 mcg-100 mcg/actuation solution for inhalation  . losartan (COZAAR) 50 MG tablet Take by mouth.  . nortriptyline (PAMELOR) 50 MG capsule nortriptyline 50 mg capsule  . ondansetron (ZOFRAN) 4 MG tablet ondansetron HCl 4 mg tablet  TAKE 1 TABLET BY MOUTH EVERY 8 HOURS AS NEEDED  . potassium chloride (KLOR-CON M10) 10 MEQ  tablet Klor-Con M10 mEq tablet,extended release  . rivaroxaban (XARELTO) 20 MG TABS tablet Xarelto 20 mg tablet  . rosuvastatin (CRESTOR) 20 MG tablet rosuvastatin 20 mg tablet  . sucralfate (CARAFATE) 1 GM/10ML suspension Carafate 100 mg/mL oral suspension  . VICTOZA 18 MG/3ML SOPN Inject 1.8 mg into the skin daily.  . nicotine (NICODERM CQ) 14 mg/24hr patch Place 1 patch (14 mg total) onto the skin daily.  . Tiotropium Bromide-Olodaterol (STIOLTO RESPIMAT) 2.5-2.5 MCG/ACT AERS Inhale 2 puffs into the lungs daily.  . [DISCONTINUED] BYETTA 10 MCG PEN 10 MCG/0.04ML SOPN injection SMARTSIG:10 MCG SUB-Q Twice Daily   No facility-administered encounter medications on file as of 12/06/2019.     Review of Systems  Review of Systems  Constitutional: Positive for fatigue. Negative for activity change and fever.  HENT: Negative for sinus pressure, sinus pain and sore throat.   Respiratory: Positive for shortness of breath and wheezing. Negative for cough.   Cardiovascular: Negative for chest pain and palpitations.  Gastrointestinal: Negative for diarrhea, nausea and vomiting.  Musculoskeletal: Negative for arthralgias.  Neurological: Negative for dizziness.  Psychiatric/Behavioral: Negative for sleep disturbance. The patient is not nervous/anxious.      Physical Exam  BP 128/84 (BP Location: Left Arm, Cuff Size:  Normal)   Pulse 78   Temp 98.3 F (36.8 C) (Oral)   Ht '5\' 4"'  (1.626 m)   Wt (!) 244 lb 6.4 oz (110.9 kg)   SpO2 96%   BMI 41.95 kg/m   Wt Readings from Last 5 Encounters:  12/06/19 (!) 244 lb 6.4 oz (110.9 kg)  10/24/19 239 lb (108.4 kg)    BMI Readings from Last 5 Encounters:  12/06/19 41.95 kg/m  10/24/19 42.34 kg/m     Physical Exam Vitals and nursing note reviewed.  Constitutional:      General: She is not in acute distress.    Appearance: Normal appearance. She is obese.  HENT:     Head: Normocephalic and atraumatic.     Right Ear: External ear normal.     Left Ear: External ear normal.     Nose: Nose normal. No congestion.     Mouth/Throat:     Mouth: Mucous membranes are dry.     Pharynx: Oropharynx is clear.  Eyes:     Pupils: Pupils are equal, round, and reactive to light.  Cardiovascular:     Rate and Rhythm: Normal rate and regular rhythm.     Pulses: Normal pulses.     Heart sounds: Normal heart sounds. No murmur heard.   Pulmonary:     Effort: Pulmonary effort is normal. No respiratory distress.     Breath sounds: Normal breath sounds. No decreased air movement. No decreased breath sounds, wheezing or rales.  Musculoskeletal:     Cervical back: Normal range of motion.  Skin:    General: Skin is warm and dry.     Capillary Refill: Capillary refill takes less than 2 seconds.  Neurological:     General: No focal deficit present.     Mental Status: She is alert and oriented to person, place, and time. Mental status is at baseline.     Gait: Gait normal.  Psychiatric:        Mood and Affect: Mood normal.        Behavior: Behavior normal.        Thought Content: Thought content normal.        Judgment: Judgment normal.       Assessment &  Plan:   OSA (obstructive sleep apnea) At risk for obstructive sleep apnea  Plan: Worked with patient care coordinator so patient could take home sleep study equipment home with the appointment Patient  aware she needs to bring back to our office on Monday morning at 9 AM  COPD with chronic bronchitis and emphysema ([place]) Suspected emphysema Reviewed PFTs with patient today Not adherent to ICS/LABA Using rescue inhaler 1-2 times of the day Current smoker  Plan: Emphasized need for the patient to stop smoking We will do trial of LAMA/LABA-Stiolto Respimat Pneumovax 23 today Referred to lung cancer screening program to evaluate chest imaging Follow-up in 3 months  Smoker Current smoker 87.5-pack-year smoking history Currently smoking 5 to 10 cigarettes a day Has not set a quit date Has been taking Chantix for over a year  Plan: Emphasized need to stop smoking Continue Chantix Start 14 mg nicotine patch Set quit date within the next 4 to 6 weeks Refer to lung cancer screening program Pneumovax 23 today    Return in about 3 months (around 03/07/2020), or if symptoms worsen or fail to improve, for Follow up with Dr. Elsworth Soho, Follow up with Wyn Quaker FNP-C.   Lauraine Rinne, NP 12/06/2019   This appointment required 33 minutes of patient care (this includes precharting, chart review, review of results, face-to-face care, etc.).

## 2019-12-06 NOTE — Assessment & Plan Note (Signed)
Current smoker 87.5-pack-year smoking history Currently smoking 5 to 10 cigarettes a day Has not set a quit date Has been taking Chantix for over a year  Plan: Emphasized need to stop smoking Continue Chantix Start 14 mg nicotine patch Set quit date within the next 4 to 6 weeks Refer to lung cancer screening program Pneumovax 23 today

## 2019-12-06 NOTE — Assessment & Plan Note (Signed)
Suspected emphysema Reviewed PFTs with patient today Not adherent to ICS/LABA Using rescue inhaler 1-2 times of the day Current smoker  Plan: Emphasized need for the patient to stop smoking We will do trial of LAMA/LABA-Stiolto Respimat Pneumovax 23 today Referred to lung cancer screening program to evaluate chest imaging Follow-up in 3 months

## 2019-12-06 NOTE — Patient Instructions (Addendum)
You were seen today by Coral Ceo, NP  for:   1. Smoker  - Ambulatory Referral for Lung Cancer Scre  We will refer you today to our lung cancer screening program >>>This is based off of your 87.5 pack-year smoking history >>> This is a recommendation from the [place] preventative services task force (USPSTF) >>>The USPSTF recommends annual screening for lung cancer with low-dose computed tomography (LDCT) in adults aged 64 to 80 years who have a 20 pack-year smoking history and currently smoke or have quit within the past 15 years. Screening should be discontinued once a person has not smoked for 15 years or develops a health problem that substantially limits life expectancy or the ability or willingness to have curative lung surgery.   Our office will call you and set up an appointment with Kandice Robinsons (Nurse Practitioner) who leads this program.  This appointment takes place in our office.  After completing this meeting with Kandice Robinsons NP you will get a low-dose CT as the screening >>>We will call you with those results  We recommend that you stop smoking.  >>>You need to set a quit date >>>If you have friends or family who smoke, let them know you are trying to quit and not to smoke around you or in your living environment  Smoking Cessation Resources:  1 800 QUIT NOW  >>> Patient to call this resource and utilize it to help support her quit smoking >>> Keep up your hard work with stopping smoking  You can also contact the [facility] >>>For smoking cessation classes call [phone]  We do not recommend using e-cigarettes as a form of stopping smoking  You can sign up for smoking cessation support texts and information:  >>>https://smokefree.gov/smokefreetxt  Nicotine patches: >>>Make sure you rotate sites that you do not get skin irritation, Apply 1 patch each morning to a non-hairy skin site  If you are smoking greater than 10 cigarettes/day and weigh over 45 kg  start with the nicotine patch of 21 mg a day for 6 weeks, then 14 mg a day for 2 weeks, then finished with 7 mg a day for 2 weeks, then stop  If you are smoking less than 10 cigarettes a day or weight less than 45 kg start with medium dose pack of 14 mg a day for 6 weeks, followed by 7 mg a day for 2 weeks   >>>If insomnia occurs you are having trouble sleeping you can take the patch off at night, and place a new one on in the morning >>>If the patch is removed at night and you have morning cravings start short acting nicotine replacement therapy such as gum or lozenges  Continue using Chantix  Start using 14 mg nicotine patch  Set quit date  2. COPD with chronic bronchitis and emphysema (HCC)  I do not believe you need to take Wixela  I believe the most important thing will be for you to stop smoking  Pneumovax 23 today  Trial of Stiolto Respimat inhaler >>>2 puffs daily >>>Take this no matter what >>>This is not a rescue inhaler   3. Healthcare maintenance  Pneumovax 23 today  4. OSA (obstructive sleep apnea)  We coordinated with our patient care coordinators for you to be able to take your home sleep study device home today  Please bring her back Monday morning at 9 AM sharp   We recommend today:  Orders Placed This Encounter  Procedures  . Ambulatory Referral  for Lung Cancer Scre    Referral Priority:   Urgent    Referral Type:   Consultation    Referral Reason:   Specialty Services Required    Number of Visits Requested:   1   Orders Placed This Encounter  Procedures  . Ambulatory Referral for Lung Cancer Scre   Meds ordered this encounter  Medications  . nicotine (NICODERM CQ) 14 mg/24hr patch    Sig: Place 1 patch (14 mg total) onto the skin daily.    Dispense:  28 patch    Refill:  3    Follow Up:    Return in about 3 months (around 03/07/2020), or if symptoms worsen or fail to improve, for Follow up with Dr. Vassie Loll, Follow up with Elisha Headland  FNP-C.   Please do your part to reduce the spread of COVID-19:      Reduce your risk of any infection  and COVID19 by using the similar precautions used for avoiding the common cold or flu:  Marland Kitchen Wash your hands often with soap and warm water for at least 20 seconds.  If soap and water are not readily available, use an alcohol-based hand sanitizer with at least 60% alcohol.  . If coughing or sneezing, cover your mouth and nose by coughing or sneezing into the elbow areas of your shirt or coat, into a tissue or into your sleeve (not your hands). Drinda Butts A MASK when in public  . Avoid shaking hands with others and consider head nods or verbal greetings only. . Avoid touching your eyes, nose, or mouth with unwashed hands.  . Avoid close contact with people who are sick. . Avoid places or events with large numbers of people in one location, like concerts or sporting events. . If you have some symptoms but not all symptoms, continue to monitor at home and seek medical attention if your symptoms worsen. . If you are having a medical emergency, call 911.   ADDITIONAL HEALTHCARE OPTIONS FOR PATIENTS  [facility] / e-Visit: [email or web address]         [facility]: [phone]  [facility]: [phone]                   [facility]: [phone]     It is flu season:   >>> Best ways to protect herself from the flu: Receive the yearly flu vaccine, practice good hand hygiene washing with soap and also using hand sanitizer when available, eat a nutritious meals, get adequate rest, hydrate appropriately   Please contact the office if your symptoms worsen or you have concerns that you are not improving.   Thank you for choosing [facility] for your healthcare, and for allowing [place] to partner with you on your healthcare journey. I am thankful to be able to provide care to you today.   Elisha Headland FNP-C

## 2019-12-12 ENCOUNTER — Other Ambulatory Visit: Payer: Self-pay | Admitting: *Deleted

## 2019-12-12 DIAGNOSIS — F17210 Nicotine dependence, cigarettes, uncomplicated: Secondary | ICD-10-CM

## 2019-12-12 DIAGNOSIS — Z87891 Personal history of nicotine dependence: Secondary | ICD-10-CM

## 2019-12-13 ENCOUNTER — Telehealth: Payer: Self-pay | Admitting: Pulmonary Disease

## 2019-12-13 DIAGNOSIS — G4733 Obstructive sleep apnea (adult) (pediatric): Secondary | ICD-10-CM | POA: Diagnosis not present

## 2019-12-13 NOTE — Telephone Encounter (Signed)
HST showed moderate OSA, AHI 15/hour, worse during supine sleep Suggest we start on CPAP therapy. Please send prescription for Auto CPAP 5 to 15 cm, mask of choice  Schedule office visit with APP/me in 6 weeks

## 2019-12-16 NOTE — Telephone Encounter (Signed)
Called and left message on voicemail to please call us back.

## 2019-12-17 ENCOUNTER — Telehealth: Payer: Self-pay

## 2019-12-17 NOTE — Telephone Encounter (Signed)
Attempted again to call patient about HST results and orders per Dr Vassie Loll, left voicemail on home phone to please call back. Cell phone number did not ring only had busy signal.

## 2019-12-20 ENCOUNTER — Other Ambulatory Visit: Payer: Self-pay | Admitting: Pulmonary Disease

## 2019-12-20 DIAGNOSIS — J449 Chronic obstructive pulmonary disease, unspecified: Secondary | ICD-10-CM

## 2019-12-20 DIAGNOSIS — G4733 Obstructive sleep apnea (adult) (pediatric): Secondary | ICD-10-CM

## 2019-12-20 NOTE — Telephone Encounter (Signed)
12/17/19 Attempted again to call patient about HST results and orders per Dr Vassie Loll, left voicemail on home phone to please call back. Cell phone number did not ring only had busy signal.

## 2019-12-20 NOTE — Telephone Encounter (Signed)
Pt was informed of hst results and new order sent to dme company. Already has a f/u set up

## 2020-01-08 DIAGNOSIS — E119 Type 2 diabetes mellitus without complications: Secondary | ICD-10-CM | POA: Diagnosis not present

## 2020-01-09 ENCOUNTER — Ambulatory Visit (INDEPENDENT_AMBULATORY_CARE_PROVIDER_SITE_OTHER): Payer: Medicare Other | Admitting: Primary Care

## 2020-01-09 ENCOUNTER — Encounter: Payer: Self-pay | Admitting: Primary Care

## 2020-01-09 ENCOUNTER — Ambulatory Visit
Admission: RE | Admit: 2020-01-09 | Discharge: 2020-01-09 | Disposition: A | Discharge status: Home / Self Care | Payer: Medicare Other | Source: Ambulatory Visit | Attending: Acute Care | Admitting: Acute Care

## 2020-01-09 ENCOUNTER — Other Ambulatory Visit: Payer: Self-pay

## 2020-01-09 VITALS — BP 164/84 | HR 108 | Temp 97.6°F | Ht 63.0 in | Wt 242.2 lb

## 2020-01-09 DIAGNOSIS — F17210 Nicotine dependence, cigarettes, uncomplicated: Secondary | ICD-10-CM | POA: Diagnosis not present

## 2020-01-09 DIAGNOSIS — F17200 Nicotine dependence, unspecified, uncomplicated: Secondary | ICD-10-CM

## 2020-01-09 DIAGNOSIS — Z87891 Personal history of nicotine dependence: Secondary | ICD-10-CM | POA: Diagnosis not present

## 2020-01-09 NOTE — Patient Instructions (Signed)
Thank you for participating in the [facility]. It was our pleasure to meet you today. We will call you with the results of your scan within the next few days. Your scan will be assigned a Lung RADS category score by the physicians reading the scans.  This Lung RADS score determines follow up scanning.  See below for description of categories, and follow up screening recommendations. We will be in touch to schedule your follow up screening annually or based on recommendations of our providers. We will fax a copy of your scan results to your Primary Care Physician, or the physician who referred you to the program, to ensure they have the results. Please call the office if you have any questions or concerns regarding your scanning experience or results.  Our office number is [phone]. Please speak with Denise Phelps, RN. She is our Lung Cancer Screening RN. If she is unavailable when you call, please have the office staff send her a message. She will return your call at her earliest convenience. Remember, if your scan is normal, we will scan you annually as long as you continue to meet the criteria for the program. (Age 55-77, Current smoker or smoker who has quit within the last 15 years). If you are a smoker, remember, quitting is the single most powerful action that you can take to decrease your risk of lung cancer and other pulmonary, breathing related problems. We know quitting is hard, and we are here to help.  Please let us know if there is anything we can do to help you meet your goal of quitting. If you are a former smoker, congratulations. We are proud of you! Remain smoke free! Remember you can refer friends or family members through the number above.  We will screen them to make sure they meet criteria for the program. Thank you for helping us take better care of you by participating in Lung Screening.  Lung RADS Categories:  Lung RADS 1: no nodules  or definitely non-concerning nodules.  Recommendation is for a repeat annual scan in 12 months.  Lung RADS 2:  nodules that are non-concerning in appearance and behavior with a very low likelihood of becoming an active cancer. Recommendation is for a repeat annual scan in 12 months.  Lung RADS 3: nodules that are probably non-concerning , includes nodules with a low likelihood of becoming an active cancer.  Recommendation is for a 6-month repeat screening scan. Often noted after an upper respiratory illness. We will be in touch to make sure you have no questions, and to schedule your 6-month scan.  Lung RADS 4 A: nodules with concerning findings, recommendation is most often for a follow up scan in 3 months or additional testing based on our provider's assessment of the scan. We will be in touch to make sure you have no questions and to schedule the recommended 3 month follow up scan.  Lung RADS 4 B:  indicates findings that are concerning. We will be in touch with you to schedule additional diagnostic testing based on our provider's  assessment of the scan.   

## 2020-01-09 NOTE — Progress Notes (Signed)
Shared Decision Making Visit Lung Cancer Screening Program [phone])   Eligibility:  Age 64 y.o.  Pack Years Smoking History Calculation 50 (# packs/per year x # years smoked)  Recent History of coughing up blood  no  Unexplained weight loss? no ( >Than 15 pounds within the last 6 months )  Prior History Lung / other cancer no (Diagnosis within the last 5 years already requiring surveillance chest CT Scans).  Smoking Status Current Smoker  Visit Components:  Discussion included one or more decision making aids. yes  Discussion included risk/benefits of screening. yes  Discussion included potential follow up diagnostic testing for abnormal scans. yes  Discussion included meaning and risk of over diagnosis. yes  Discussion included meaning and risk of False Positives. yes  Discussion included meaning of total radiation exposure. yes  Counseling Included:  Importance of adherence to annual lung cancer LDCT screening. yes  Impact of comorbidities on ability to participate in the program. yes  Ability and willingness to under diagnostic treatment. yes  Smoking Cessation Counseling:  Current Smokers:   Discussed importance of smoking cessation. yes  Information about tobacco cessation classes and interventions provided to patient. yes  Patient provided with "ticket" for LDCT Scan. yes  Symptomatic Patient. yes  Counseling(Intermediate counseling: > three minutes) 99406  Diagnosis Code: Tobacco Use Z72.0  Asymptomatic Patient yes  Counseling (Intermediate counseling: > three minutes counseling) V0350  Former Smokers:   Discussed the importance of maintaining cigarette abstinence. yes  Diagnosis Code: Personal History of Nicotine Dependence. K93.818  Information about tobacco cessation classes and interventions provided to patient. Yes  Patient provided with "ticket" for LDCT Scan. yes  Written Order for Lung Cancer Screening with LDCT placed in Epic.  Yes (CT Chest Lung Cancer Screening Low Dose W/O CM) [id number] Z12.2-Screening of respiratory organs Z87.891-Personal history of nicotine dependence  I have spent 25 minutes of face to face time with Ms Clapp discussing the risks and benefits of lung cancer screening. We viewed a power point together that explained in detail the above noted topics. We paused at intervals to allow for questions to be asked and answered to ensure understanding.We discussed that the single most powerful action that she can take to decrease her risk of developing lung cancer is to quit smoking. We discussed whether or not she is ready to commit to setting a quit date. We discussed options for tools to aid in quitting smoking including nicotine replacement therapy, non-nicotine medications, support groups, Quit Smart classes, and behavior modification. We discussed that often times setting smaller, more achievable goals, such as eliminating 1 cigarette a day for a week and then 2 cigarettes a day for a week can be helpful in slowly decreasing the number of cigarettes smoked. This allows for a sense of accomplishment as well as providing a clinical benefit. I gave her the " Be Stronger Than Your Excuses" card with contact information for community resources, classes, free nicotine replacement therapy, and access to mobile apps, text messaging, and on-line smoking cessation help. I have also given her Herbert Deaner card and contact information in the event she needs to contact me. We discussed the time and location of the scan, and that either Abigail Miyamoto RN or I will call with the results within 24-48 hours of receiving them. I have offered her  a copy of the power point we viewed  as a resource in the event they need reinforcement of the concepts we discussed today in the office. The  patient verbalized understanding of all of  the above and had no further questions upon leaving the office. They have my contact information in the  event they have any further questions.  I spent 3 minutes counseling on smoking cessation and the health risks of continued tobacco abuse.  I explained to the patient that there has been a high incidence of coronary artery disease noted on these exams. I explained that this is a non-gated exam therefore degree or severity cannot be determined. This patient is on statin therapy. I have asked the patient to follow-up with their PCP regarding any incidental finding of coronary artery disease and management with diet or medication as their PCP  feels is clinically indicated. The patient verbalized understanding of the above and had no further questions upon completion of the visit.    Glenford Bayley, NP

## 2020-01-16 NOTE — Progress Notes (Signed)
Please call patient and let them  know their  low dose Ct was read as a Lung RADS 1, negative study: no nodules or definitely benign nodules. Radiology recommendation is for a repeat LDCT in 12 months..Please let them  know we will order and schedule their  annual screening scan for 01/2021. Please let them  know there was notation of CAD on their  scan.  Please remind the patient  that this is a non-gated exam therefore degree or severity of disease  cannot be determined. Please have them  follow up with their PCP regarding potential risk factor modification, dietary therapy or pharmacologic therapy if clinically indicated. Pt.  is  currently on statin therapy. Please place order for annual  screening scan for  01/2021 and fax results to PCP. Thanks so much.  

## 2020-01-17 ENCOUNTER — Other Ambulatory Visit: Payer: Self-pay | Admitting: *Deleted

## 2020-01-17 DIAGNOSIS — F17210 Nicotine dependence, cigarettes, uncomplicated: Secondary | ICD-10-CM

## 2020-01-23 ENCOUNTER — Telehealth: Payer: Self-pay | Admitting: Pulmonary Disease

## 2020-01-23 MED ORDER — STIOLTO RESPIMAT 2.5-2.5 MCG/ACT IN AERS: 2.0000 | INHALATION_SPRAY | Freq: Every day | RESPIRATORY_TRACT | 5 refills | Status: DC

## 2020-01-23 NOTE — Telephone Encounter (Signed)
Rx for Stiolto has been sent to preferred pharmacy for pt at her request. Called and spoke with pt letting her know this had been done and she verbalized understanding. Nothing further needed.

## 2020-02-13 DIAGNOSIS — G4733 Obstructive sleep apnea (adult) (pediatric): Secondary | ICD-10-CM | POA: Diagnosis not present

## 2020-02-14 DIAGNOSIS — E1140 Type 2 diabetes mellitus with diabetic neuropathy, unspecified: Secondary | ICD-10-CM | POA: Diagnosis not present

## 2020-02-14 DIAGNOSIS — I872 Venous insufficiency (chronic) (peripheral): Secondary | ICD-10-CM | POA: Diagnosis not present

## 2020-02-14 DIAGNOSIS — G629 Polyneuropathy, unspecified: Secondary | ICD-10-CM | POA: Diagnosis not present

## 2020-02-14 DIAGNOSIS — I10 Essential (primary) hypertension: Secondary | ICD-10-CM | POA: Diagnosis not present

## 2020-02-14 DIAGNOSIS — E785 Hyperlipidemia, unspecified: Secondary | ICD-10-CM | POA: Diagnosis not present

## 2020-03-12 ENCOUNTER — Ambulatory Visit: Payer: Medicare Other | Admitting: Pulmonary Disease

## 2020-03-13 DIAGNOSIS — Z86711 Personal history of pulmonary embolism: Secondary | ICD-10-CM | POA: Diagnosis not present

## 2020-03-13 DIAGNOSIS — E782 Mixed hyperlipidemia: Secondary | ICD-10-CM | POA: Diagnosis not present

## 2020-03-13 DIAGNOSIS — I2699 Other pulmonary embolism without acute cor pulmonale: Secondary | ICD-10-CM | POA: Diagnosis not present

## 2020-03-13 DIAGNOSIS — R002 Palpitations: Secondary | ICD-10-CM | POA: Diagnosis not present

## 2020-03-13 DIAGNOSIS — R079 Chest pain, unspecified: Secondary | ICD-10-CM | POA: Diagnosis not present

## 2020-03-15 DIAGNOSIS — G4733 Obstructive sleep apnea (adult) (pediatric): Secondary | ICD-10-CM | POA: Diagnosis not present

## 2020-03-17 ENCOUNTER — Telehealth: Payer: Self-pay | Admitting: Pulmonary Disease

## 2020-03-17 DIAGNOSIS — Z23 Encounter for immunization: Secondary | ICD-10-CM | POA: Diagnosis not present

## 2020-03-17 DIAGNOSIS — G629 Polyneuropathy, unspecified: Secondary | ICD-10-CM | POA: Diagnosis not present

## 2020-03-17 DIAGNOSIS — I10 Essential (primary) hypertension: Secondary | ICD-10-CM | POA: Diagnosis not present

## 2020-03-17 DIAGNOSIS — E1140 Type 2 diabetes mellitus with diabetic neuropathy, unspecified: Secondary | ICD-10-CM | POA: Diagnosis not present

## 2020-03-17 DIAGNOSIS — I872 Venous insufficiency (chronic) (peripheral): Secondary | ICD-10-CM | POA: Diagnosis not present

## 2020-03-17 DIAGNOSIS — E785 Hyperlipidemia, unspecified: Secondary | ICD-10-CM | POA: Diagnosis not present

## 2020-03-17 NOTE — Telephone Encounter (Signed)
Called and spoke to patient, who is requesting dates of PNA vaccine and flu shot for this year.  Patient is aware that per our records, she had PNA vaccine on 12/06/2019. Last flu shot on file is from 02/25/2019. Patient voiced her understanding and had no further questions.  Nothing further needed.

## 2020-03-17 NOTE — Telephone Encounter (Signed)
Pt returning missed call. Can be reached at [phone]

## 2020-03-17 NOTE — Telephone Encounter (Signed)
Lm from patient.

## 2020-04-09 ENCOUNTER — Other Ambulatory Visit: Payer: Self-pay | Admitting: Physician Assistant

## 2020-04-09 DIAGNOSIS — R5381 Other malaise: Secondary | ICD-10-CM

## 2020-04-14 DIAGNOSIS — G4733 Obstructive sleep apnea (adult) (pediatric): Secondary | ICD-10-CM | POA: Diagnosis not present

## 2020-04-27 ENCOUNTER — Other Ambulatory Visit: Payer: Self-pay | Admitting: Physician Assistant

## 2020-04-27 DIAGNOSIS — E559 Vitamin D deficiency, unspecified: Secondary | ICD-10-CM

## 2020-04-27 DIAGNOSIS — E2839 Other primary ovarian failure: Secondary | ICD-10-CM

## 2020-04-28 ENCOUNTER — Other Ambulatory Visit: Payer: Medicare Other

## 2020-05-18 DIAGNOSIS — E785 Hyperlipidemia, unspecified: Secondary | ICD-10-CM | POA: Diagnosis not present

## 2020-05-18 DIAGNOSIS — B9729 Other coronavirus as the cause of diseases classified elsewhere: Secondary | ICD-10-CM | POA: Diagnosis not present

## 2020-05-18 DIAGNOSIS — U071 COVID-19: Secondary | ICD-10-CM | POA: Diagnosis not present

## 2020-05-18 DIAGNOSIS — J22 Unspecified acute lower respiratory infection: Secondary | ICD-10-CM | POA: Diagnosis not present

## 2020-05-18 DIAGNOSIS — E1140 Type 2 diabetes mellitus with diabetic neuropathy, unspecified: Secondary | ICD-10-CM | POA: Diagnosis not present

## 2020-05-18 DIAGNOSIS — N289 Disorder of kidney and ureter, unspecified: Secondary | ICD-10-CM | POA: Diagnosis not present

## 2020-05-18 DIAGNOSIS — G629 Polyneuropathy, unspecified: Secondary | ICD-10-CM | POA: Diagnosis not present

## 2020-05-18 DIAGNOSIS — I872 Venous insufficiency (chronic) (peripheral): Secondary | ICD-10-CM | POA: Diagnosis not present

## 2020-05-18 DIAGNOSIS — Z20822 Contact with and (suspected) exposure to covid-19: Secondary | ICD-10-CM | POA: Diagnosis not present

## 2020-05-18 DIAGNOSIS — Z720 Tobacco use: Secondary | ICD-10-CM | POA: Diagnosis not present

## 2020-05-18 DIAGNOSIS — I10 Essential (primary) hypertension: Secondary | ICD-10-CM | POA: Diagnosis not present

## 2020-05-18 DIAGNOSIS — Z7901 Long term (current) use of anticoagulants: Secondary | ICD-10-CM | POA: Diagnosis not present

## 2020-05-18 DIAGNOSIS — K5909 Other constipation: Secondary | ICD-10-CM | POA: Diagnosis not present

## 2020-06-18 DIAGNOSIS — I10 Essential (primary) hypertension: Secondary | ICD-10-CM | POA: Diagnosis not present

## 2020-06-18 DIAGNOSIS — E1140 Type 2 diabetes mellitus with diabetic neuropathy, unspecified: Secondary | ICD-10-CM | POA: Diagnosis not present

## 2020-06-18 DIAGNOSIS — K5909 Other constipation: Secondary | ICD-10-CM | POA: Diagnosis not present

## 2020-06-18 DIAGNOSIS — E785 Hyperlipidemia, unspecified: Secondary | ICD-10-CM | POA: Diagnosis not present

## 2020-06-18 DIAGNOSIS — I872 Venous insufficiency (chronic) (peripheral): Secondary | ICD-10-CM | POA: Diagnosis not present

## 2020-06-18 DIAGNOSIS — H00024 Hordeolum internum left upper eyelid: Secondary | ICD-10-CM | POA: Diagnosis not present

## 2020-06-18 DIAGNOSIS — Z7901 Long term (current) use of anticoagulants: Secondary | ICD-10-CM | POA: Diagnosis not present

## 2020-06-18 DIAGNOSIS — Z720 Tobacco use: Secondary | ICD-10-CM | POA: Diagnosis not present

## 2020-06-18 DIAGNOSIS — G629 Polyneuropathy, unspecified: Secondary | ICD-10-CM | POA: Diagnosis not present

## 2020-06-18 DIAGNOSIS — N289 Disorder of kidney and ureter, unspecified: Secondary | ICD-10-CM | POA: Diagnosis not present

## 2020-07-06 ENCOUNTER — Other Ambulatory Visit: Payer: Self-pay | Admitting: Physician Assistant

## 2020-07-06 DIAGNOSIS — E2839 Other primary ovarian failure: Secondary | ICD-10-CM

## 2020-07-08 ENCOUNTER — Other Ambulatory Visit: Payer: Medicare Other

## 2020-07-29 ENCOUNTER — Other Ambulatory Visit: Payer: Self-pay | Admitting: Physician Assistant

## 2020-07-29 DIAGNOSIS — Z8371 Family history of colonic polyps: Secondary | ICD-10-CM | POA: Diagnosis not present

## 2020-07-29 DIAGNOSIS — K5900 Constipation, unspecified: Secondary | ICD-10-CM | POA: Diagnosis not present

## 2020-07-29 DIAGNOSIS — R1031 Right lower quadrant pain: Secondary | ICD-10-CM | POA: Diagnosis not present

## 2020-07-29 DIAGNOSIS — R6881 Early satiety: Secondary | ICD-10-CM | POA: Diagnosis not present

## 2020-07-29 DIAGNOSIS — K921 Melena: Secondary | ICD-10-CM | POA: Diagnosis not present

## 2020-07-29 DIAGNOSIS — Z791 Long term (current) use of non-steroidal anti-inflammatories (NSAID): Secondary | ICD-10-CM | POA: Diagnosis not present

## 2020-08-14 ENCOUNTER — Other Ambulatory Visit: Payer: Self-pay | Admitting: Podiatry

## 2020-08-14 ENCOUNTER — Ambulatory Visit (INDEPENDENT_AMBULATORY_CARE_PROVIDER_SITE_OTHER): Payer: Medicare Other

## 2020-08-14 ENCOUNTER — Ambulatory Visit (INDEPENDENT_AMBULATORY_CARE_PROVIDER_SITE_OTHER): Payer: Medicare Other | Admitting: Podiatry

## 2020-08-14 ENCOUNTER — Other Ambulatory Visit: Payer: Self-pay

## 2020-08-14 DIAGNOSIS — M79671 Pain in right foot: Secondary | ICD-10-CM

## 2020-08-14 DIAGNOSIS — M722 Plantar fascial fibromatosis: Secondary | ICD-10-CM | POA: Diagnosis not present

## 2020-08-14 DIAGNOSIS — M79672 Pain in left foot: Secondary | ICD-10-CM | POA: Diagnosis not present

## 2020-08-16 ENCOUNTER — Other Ambulatory Visit: Payer: Self-pay | Admitting: Pulmonary Disease

## 2020-08-17 ENCOUNTER — Ambulatory Visit
Admission: RE | Admit: 2020-08-17 | Discharge: 2020-08-17 | Disposition: A | Discharge status: Home / Self Care | Payer: Medicare Other | Source: Ambulatory Visit | Attending: Physician Assistant | Admitting: Physician Assistant

## 2020-08-17 ENCOUNTER — Other Ambulatory Visit: Payer: Self-pay

## 2020-08-17 DIAGNOSIS — K8020 Calculus of gallbladder without cholecystitis without obstruction: Secondary | ICD-10-CM | POA: Diagnosis not present

## 2020-08-17 DIAGNOSIS — E278 Other specified disorders of adrenal gland: Secondary | ICD-10-CM | POA: Diagnosis not present

## 2020-08-17 DIAGNOSIS — R1031 Right lower quadrant pain: Secondary | ICD-10-CM

## 2020-08-17 DIAGNOSIS — K429 Umbilical hernia without obstruction or gangrene: Secondary | ICD-10-CM | POA: Diagnosis not present

## 2020-08-17 DIAGNOSIS — K8080 Other cholelithiasis without obstruction: Secondary | ICD-10-CM | POA: Diagnosis not present

## 2020-08-17 MED ORDER — IOPAMIDOL (ISOVUE-300) INJECTION 61%
100.0000 mL | Freq: Once | INTRAVENOUS | Status: AC | PRN
  Administered 2020-08-17: 100 mL via INTRAVENOUS

## 2020-08-18 ENCOUNTER — Encounter: Payer: Self-pay | Admitting: Podiatry

## 2020-08-18 NOTE — Progress Notes (Signed)
Subjective:  Patient ID: [name], female    DOB: 04-03-1956,  MRN: [id number]  Chief Complaint  Patient presents with  . Foot Pain    Foot pain bilateral     65 y.o. female presents with the above complaint.  Patient presents with complaint of bilateral heel pain that has been going on for quite some time.  Patient states it painful to walk on it.  Pain with standing on it especially worse in the morning.  She states it gets better with shoe gear modification.  She has not seen anyone else prior to seeing me.  She would like to discuss treatment options for this.  She has not received injection in the past she has not made any shoe changes either.   Review of Systems: Negative except as noted in the HPI. Denies N/V/F/Ch.  No past medical history on file.  Current Outpatient Medications:  .  albuterol (VENTOLIN HFA) 108 (90 Base) MCG/ACT inhaler, albuterol sulfate HFA 90 mcg/actuation aerosol inhaler, Disp: , Rfl:  .  amLODipine (NORVASC) 10 MG tablet, amlodipine 10 mg tablet, Disp: , Rfl:  .  ARIPiprazole (ABILIFY) 5 MG tablet, aripiprazole 5 mg tablet, Disp: , Rfl:  .  aspirin 325 MG EC tablet, 1 tablet, Disp: , Rfl:  .  celecoxib (CELEBREX) 200 MG capsule, celecoxib 200 mg capsule  TAKE 1 CAPSULE BY MOUTH TWICE A DAY, Disp: , Rfl:  .  CHANTIX CONTINUING MONTH PAK 1 MG tablet, Take 1 mg by mouth 2 (two) times daily. (Patient not taking: Reported on 01/09/2020), Disp: , Rfl:  .  cloNIDine (CATAPRES) 0.1 MG tablet, clonidine HCl 0.1 mg tablet, Disp: , Rfl:  .  esomeprazole (NEXIUM) 40 MG capsule, esomeprazole magnesium 40 mg capsule,delayed release  TAKE 1 CAPSULE BY MOUTH EVERY DAY, Disp: , Rfl:  .  Fluticasone-Salmeterol (WIXELA INHUB) 250-50 MCG/DOSE AEPB, Wixela Inhub 250 mcg-50 mcg/dose powder for inhalation, Disp: , Rfl:  .  furosemide (LASIX) 40 MG tablet, furosemide 40 mg tablet, Disp: , Rfl:  .  glimepiride (AMARYL) 2 MG tablet, 1 TAB(S) ONCE A DAY ORALLY 30 DAY(S), Disp:  , Rfl:  .  hydrALAZINE (APRESOLINE) 25 MG tablet, hydralazine 25 mg tablet, Disp: , Rfl:  .  Ipratropium-Albuterol (COMBIVENT RESPIMAT) 20-100 MCG/ACT AERS respimat, Combivent Respimat 20 mcg-100 mcg/actuation solution for inhalation, Disp: , Rfl:  .  losartan (COZAAR) 50 MG tablet, Take by mouth., Disp: , Rfl:  .  nicotine (NICODERM CQ) 14 mg/24hr patch, Place 1 patch (14 mg total) onto the skin daily., Disp: 28 patch, Rfl: 3 .  nortriptyline (PAMELOR) 50 MG capsule, nortriptyline 50 mg capsule, Disp: , Rfl:  .  ondansetron (ZOFRAN) 4 MG tablet, ondansetron HCl 4 mg tablet  TAKE 1 TABLET BY MOUTH EVERY 8 HOURS AS NEEDED, Disp: , Rfl:  .  potassium chloride (KLOR-CON M10) 10 MEQ tablet, Klor-Con M10 mEq tablet,extended release, Disp: , Rfl:  .  rivaroxaban (XARELTO) 20 MG TABS tablet, Xarelto 20 mg tablet, Disp: , Rfl:  .  rosuvastatin (CRESTOR) 20 MG tablet, rosuvastatin 20 mg tablet, Disp: , Rfl:  .  STIOLTO RESPIMAT 2.5-2.5 MCG/ACT AERS, INHALE 2 PUFFS BY MOUTH INTO THE LUNGS DAILY, Disp: 4 g, Rfl: 2 .  sucralfate (CARAFATE) 1 GM/10ML suspension, Carafate 100 mg/mL oral suspension, Disp: , Rfl:  .  VICTOZA 18 MG/3ML SOPN, Inject 1.8 mg into the skin daily., Disp: , Rfl:   Social History   Tobacco Use  Smoking Status Current Every Day  Smoker  . Packs/day: 1.50  . Years: 55.00  . Pack years: 82.50  . Start date: 06/07/1964  Smokeless Tobacco Never Used    Allergies  Allergen Reactions  . Codeine     REACTION: itch  . Codeine Phosphate     REACTION: itching  . Tramadol-Acetaminophen     REACTION: itch   Objective:  There were no vitals filed for this visit. There is no height or weight on file to calculate BMI. Constitutional Well developed. Well nourished.  Vascular Dorsalis pedis pulses palpable bilaterally. Posterior tibial pulses palpable bilaterally. Capillary refill normal to all digits.  No cyanosis or clubbing noted. Pedal hair growth normal.  Neurologic Normal  speech. Oriented to person, place, and time. Epicritic sensation to light touch grossly present bilaterally.  Dermatologic Nails well groomed and normal in appearance. No open wounds. No skin lesions.  Orthopedic: Normal joint ROM without pain or crepitus bilaterally. No visible deformities. Tender to palpation at the calcaneal tuber bilaterally. No pain with calcaneal squeeze bilaterally. Ankle ROM diminished range of motion bilaterally. Silfverskiold Test: positive bilaterally.   Radiographs: Taken and reviewed. No acute fractures or dislocations. No evidence of stress fracture.  Plantar heel spur absent. Posterior heel spur present.   Assessment:   1. Plantar fasciitis of right foot   2. Plantar fasciitis of left foot    Plan:  Patient was evaluated and treated and all questions answered.  Plantar Fasciitis, bilaterally - XR reviewed as above.  - Educated on icing and stretching. Instructions given.  - Injection delivered to the plantar fascia as below. - DME: Plantar Fascial Brace - Pharmacologic management: None  Procedure: Injection Tendon/Ligament Location: Bilateral plantar fascia at the glabrous junction; medial approach. Skin Prep: alcohol Injectate: 0.5 cc 0.5% marcaine plain, 0.5 cc of 1% Lidocaine, 0.5 cc kenalog 10. Disposition: Patient tolerated procedure well. Injection site dressed with a band-aid.  No follow-ups on file.

## 2020-09-11 ENCOUNTER — Ambulatory Visit: Payer: Medicare Other | Admitting: Podiatry

## 2020-09-17 DIAGNOSIS — Z20822 Contact with and (suspected) exposure to covid-19: Secondary | ICD-10-CM | POA: Diagnosis not present

## 2020-09-17 DIAGNOSIS — I10 Essential (primary) hypertension: Secondary | ICD-10-CM | POA: Diagnosis not present

## 2020-09-28 DIAGNOSIS — I872 Venous insufficiency (chronic) (peripheral): Secondary | ICD-10-CM | POA: Diagnosis not present

## 2020-09-28 DIAGNOSIS — E1140 Type 2 diabetes mellitus with diabetic neuropathy, unspecified: Secondary | ICD-10-CM | POA: Diagnosis not present

## 2020-09-28 DIAGNOSIS — Z7901 Long term (current) use of anticoagulants: Secondary | ICD-10-CM | POA: Diagnosis not present

## 2020-09-28 DIAGNOSIS — N289 Disorder of kidney and ureter, unspecified: Secondary | ICD-10-CM | POA: Diagnosis not present

## 2020-09-28 DIAGNOSIS — Z720 Tobacco use: Secondary | ICD-10-CM | POA: Diagnosis not present

## 2020-09-28 DIAGNOSIS — G629 Polyneuropathy, unspecified: Secondary | ICD-10-CM | POA: Diagnosis not present

## 2020-09-28 DIAGNOSIS — K5909 Other constipation: Secondary | ICD-10-CM | POA: Diagnosis not present

## 2020-09-28 DIAGNOSIS — I10 Essential (primary) hypertension: Secondary | ICD-10-CM | POA: Diagnosis not present

## 2020-09-28 DIAGNOSIS — E785 Hyperlipidemia, unspecified: Secondary | ICD-10-CM | POA: Diagnosis not present

## 2020-10-16 DIAGNOSIS — R42 Dizziness and giddiness: Secondary | ICD-10-CM | POA: Diagnosis not present

## 2020-10-16 DIAGNOSIS — Z7901 Long term (current) use of anticoagulants: Secondary | ICD-10-CM | POA: Diagnosis not present

## 2020-10-16 DIAGNOSIS — E785 Hyperlipidemia, unspecified: Secondary | ICD-10-CM | POA: Diagnosis not present

## 2020-10-16 DIAGNOSIS — I10 Essential (primary) hypertension: Secondary | ICD-10-CM | POA: Diagnosis not present

## 2020-10-16 DIAGNOSIS — Z720 Tobacco use: Secondary | ICD-10-CM | POA: Diagnosis not present

## 2020-10-16 DIAGNOSIS — N289 Disorder of kidney and ureter, unspecified: Secondary | ICD-10-CM | POA: Diagnosis not present

## 2020-10-16 DIAGNOSIS — I872 Venous insufficiency (chronic) (peripheral): Secondary | ICD-10-CM | POA: Diagnosis not present

## 2020-10-16 DIAGNOSIS — K5909 Other constipation: Secondary | ICD-10-CM | POA: Diagnosis not present

## 2020-10-16 DIAGNOSIS — G629 Polyneuropathy, unspecified: Secondary | ICD-10-CM | POA: Diagnosis not present

## 2020-10-16 DIAGNOSIS — E1140 Type 2 diabetes mellitus with diabetic neuropathy, unspecified: Secondary | ICD-10-CM | POA: Diagnosis not present

## 2020-10-22 DIAGNOSIS — E785 Hyperlipidemia, unspecified: Secondary | ICD-10-CM | POA: Diagnosis not present

## 2020-10-22 DIAGNOSIS — G629 Polyneuropathy, unspecified: Secondary | ICD-10-CM | POA: Diagnosis not present

## 2020-10-22 DIAGNOSIS — K5909 Other constipation: Secondary | ICD-10-CM | POA: Diagnosis not present

## 2020-10-22 DIAGNOSIS — E1140 Type 2 diabetes mellitus with diabetic neuropathy, unspecified: Secondary | ICD-10-CM | POA: Diagnosis not present

## 2020-10-22 DIAGNOSIS — N289 Disorder of kidney and ureter, unspecified: Secondary | ICD-10-CM | POA: Diagnosis not present

## 2020-10-22 DIAGNOSIS — Z7901 Long term (current) use of anticoagulants: Secondary | ICD-10-CM | POA: Diagnosis not present

## 2020-10-22 DIAGNOSIS — I872 Venous insufficiency (chronic) (peripheral): Secondary | ICD-10-CM | POA: Diagnosis not present

## 2020-10-22 DIAGNOSIS — I10 Essential (primary) hypertension: Secondary | ICD-10-CM | POA: Diagnosis not present

## 2020-10-22 DIAGNOSIS — Z720 Tobacco use: Secondary | ICD-10-CM | POA: Diagnosis not present

## 2020-10-22 DIAGNOSIS — R42 Dizziness and giddiness: Secondary | ICD-10-CM | POA: Diagnosis not present

## 2020-11-06 DIAGNOSIS — M79605 Pain in left leg: Secondary | ICD-10-CM | POA: Diagnosis not present

## 2020-11-06 DIAGNOSIS — R2689 Other abnormalities of gait and mobility: Secondary | ICD-10-CM | POA: Diagnosis not present

## 2020-11-06 DIAGNOSIS — M5459 Other low back pain: Secondary | ICD-10-CM | POA: Diagnosis not present

## 2020-11-16 DIAGNOSIS — R2689 Other abnormalities of gait and mobility: Secondary | ICD-10-CM | POA: Diagnosis not present

## 2020-11-16 DIAGNOSIS — M5459 Other low back pain: Secondary | ICD-10-CM | POA: Diagnosis not present

## 2020-11-16 DIAGNOSIS — M79605 Pain in left leg: Secondary | ICD-10-CM | POA: Diagnosis not present

## 2020-11-18 DIAGNOSIS — M25552 Pain in left hip: Secondary | ICD-10-CM | POA: Diagnosis not present

## 2020-11-19 DIAGNOSIS — M79605 Pain in left leg: Secondary | ICD-10-CM | POA: Diagnosis not present

## 2020-11-19 DIAGNOSIS — R2689 Other abnormalities of gait and mobility: Secondary | ICD-10-CM | POA: Diagnosis not present

## 2020-11-19 DIAGNOSIS — M5459 Other low back pain: Secondary | ICD-10-CM | POA: Diagnosis not present

## 2020-11-23 DIAGNOSIS — M79605 Pain in left leg: Secondary | ICD-10-CM | POA: Diagnosis not present

## 2020-11-23 DIAGNOSIS — M5459 Other low back pain: Secondary | ICD-10-CM | POA: Diagnosis not present

## 2020-11-23 DIAGNOSIS — R2689 Other abnormalities of gait and mobility: Secondary | ICD-10-CM | POA: Diagnosis not present

## 2020-11-26 DIAGNOSIS — R1030 Lower abdominal pain, unspecified: Secondary | ICD-10-CM | POA: Diagnosis not present

## 2020-11-26 DIAGNOSIS — M5459 Other low back pain: Secondary | ICD-10-CM | POA: Diagnosis not present

## 2020-11-26 DIAGNOSIS — D509 Iron deficiency anemia, unspecified: Secondary | ICD-10-CM | POA: Diagnosis not present

## 2020-11-26 DIAGNOSIS — M79605 Pain in left leg: Secondary | ICD-10-CM | POA: Diagnosis not present

## 2020-11-26 DIAGNOSIS — Z791 Long term (current) use of non-steroidal anti-inflammatories (NSAID): Secondary | ICD-10-CM | POA: Diagnosis not present

## 2020-11-26 DIAGNOSIS — R2689 Other abnormalities of gait and mobility: Secondary | ICD-10-CM | POA: Diagnosis not present

## 2020-11-26 DIAGNOSIS — K5900 Constipation, unspecified: Secondary | ICD-10-CM | POA: Diagnosis not present

## 2020-11-26 DIAGNOSIS — K921 Melena: Secondary | ICD-10-CM | POA: Diagnosis not present

## 2020-11-30 DIAGNOSIS — M5459 Other low back pain: Secondary | ICD-10-CM | POA: Diagnosis not present

## 2020-11-30 DIAGNOSIS — R2689 Other abnormalities of gait and mobility: Secondary | ICD-10-CM | POA: Diagnosis not present

## 2020-11-30 DIAGNOSIS — M79605 Pain in left leg: Secondary | ICD-10-CM | POA: Diagnosis not present

## 2020-12-03 DIAGNOSIS — M79605 Pain in left leg: Secondary | ICD-10-CM | POA: Diagnosis not present

## 2020-12-03 DIAGNOSIS — M5459 Other low back pain: Secondary | ICD-10-CM | POA: Diagnosis not present

## 2020-12-03 DIAGNOSIS — R2689 Other abnormalities of gait and mobility: Secondary | ICD-10-CM | POA: Diagnosis not present

## 2020-12-04 DIAGNOSIS — K5909 Other constipation: Secondary | ICD-10-CM | POA: Diagnosis not present

## 2020-12-04 DIAGNOSIS — Z7901 Long term (current) use of anticoagulants: Secondary | ICD-10-CM | POA: Diagnosis not present

## 2020-12-04 DIAGNOSIS — I872 Venous insufficiency (chronic) (peripheral): Secondary | ICD-10-CM | POA: Diagnosis not present

## 2020-12-04 DIAGNOSIS — G629 Polyneuropathy, unspecified: Secondary | ICD-10-CM | POA: Diagnosis not present

## 2020-12-04 DIAGNOSIS — I10 Essential (primary) hypertension: Secondary | ICD-10-CM | POA: Diagnosis not present

## 2020-12-04 DIAGNOSIS — E1140 Type 2 diabetes mellitus with diabetic neuropathy, unspecified: Secondary | ICD-10-CM | POA: Diagnosis not present

## 2020-12-04 DIAGNOSIS — E785 Hyperlipidemia, unspecified: Secondary | ICD-10-CM | POA: Diagnosis not present

## 2020-12-04 DIAGNOSIS — R42 Dizziness and giddiness: Secondary | ICD-10-CM | POA: Diagnosis not present

## 2020-12-04 DIAGNOSIS — N289 Disorder of kidney and ureter, unspecified: Secondary | ICD-10-CM | POA: Diagnosis not present

## 2020-12-04 DIAGNOSIS — Z720 Tobacco use: Secondary | ICD-10-CM | POA: Diagnosis not present

## 2020-12-10 DIAGNOSIS — R2689 Other abnormalities of gait and mobility: Secondary | ICD-10-CM | POA: Diagnosis not present

## 2020-12-10 DIAGNOSIS — M5459 Other low back pain: Secondary | ICD-10-CM | POA: Diagnosis not present

## 2020-12-10 DIAGNOSIS — M79605 Pain in left leg: Secondary | ICD-10-CM | POA: Diagnosis not present

## 2020-12-16 DIAGNOSIS — M7062 Trochanteric bursitis, left hip: Secondary | ICD-10-CM | POA: Diagnosis not present

## 2020-12-16 DIAGNOSIS — M1612 Unilateral primary osteoarthritis, left hip: Secondary | ICD-10-CM | POA: Diagnosis not present

## 2020-12-16 DIAGNOSIS — Z96653 Presence of artificial knee joint, bilateral: Secondary | ICD-10-CM | POA: Diagnosis not present

## 2020-12-16 DIAGNOSIS — M25552 Pain in left hip: Secondary | ICD-10-CM | POA: Diagnosis not present

## 2020-12-17 DIAGNOSIS — M79605 Pain in left leg: Secondary | ICD-10-CM | POA: Diagnosis not present

## 2020-12-17 DIAGNOSIS — M5459 Other low back pain: Secondary | ICD-10-CM | POA: Diagnosis not present

## 2020-12-17 DIAGNOSIS — R2689 Other abnormalities of gait and mobility: Secondary | ICD-10-CM | POA: Diagnosis not present

## 2020-12-24 DIAGNOSIS — M5459 Other low back pain: Secondary | ICD-10-CM | POA: Diagnosis not present

## 2020-12-24 DIAGNOSIS — R2689 Other abnormalities of gait and mobility: Secondary | ICD-10-CM | POA: Diagnosis not present

## 2020-12-24 DIAGNOSIS — M79605 Pain in left leg: Secondary | ICD-10-CM | POA: Diagnosis not present

## 2020-12-28 DIAGNOSIS — E1140 Type 2 diabetes mellitus with diabetic neuropathy, unspecified: Secondary | ICD-10-CM | POA: Diagnosis not present

## 2020-12-28 DIAGNOSIS — G629 Polyneuropathy, unspecified: Secondary | ICD-10-CM | POA: Diagnosis not present

## 2020-12-28 DIAGNOSIS — N289 Disorder of kidney and ureter, unspecified: Secondary | ICD-10-CM | POA: Diagnosis not present

## 2020-12-28 DIAGNOSIS — E785 Hyperlipidemia, unspecified: Secondary | ICD-10-CM | POA: Diagnosis not present

## 2020-12-28 DIAGNOSIS — E559 Vitamin D deficiency, unspecified: Secondary | ICD-10-CM | POA: Diagnosis not present

## 2020-12-28 DIAGNOSIS — Z0000 Encounter for general adult medical examination without abnormal findings: Secondary | ICD-10-CM | POA: Diagnosis not present

## 2020-12-28 DIAGNOSIS — Z720 Tobacco use: Secondary | ICD-10-CM | POA: Diagnosis not present

## 2020-12-28 DIAGNOSIS — K5909 Other constipation: Secondary | ICD-10-CM | POA: Diagnosis not present

## 2020-12-28 DIAGNOSIS — I10 Essential (primary) hypertension: Secondary | ICD-10-CM | POA: Diagnosis not present

## 2020-12-28 DIAGNOSIS — Z7901 Long term (current) use of anticoagulants: Secondary | ICD-10-CM | POA: Diagnosis not present

## 2020-12-28 DIAGNOSIS — I872 Venous insufficiency (chronic) (peripheral): Secondary | ICD-10-CM | POA: Diagnosis not present

## 2020-12-31 DIAGNOSIS — R2689 Other abnormalities of gait and mobility: Secondary | ICD-10-CM | POA: Diagnosis not present

## 2020-12-31 DIAGNOSIS — M79605 Pain in left leg: Secondary | ICD-10-CM | POA: Diagnosis not present

## 2020-12-31 DIAGNOSIS — M5459 Other low back pain: Secondary | ICD-10-CM | POA: Diagnosis not present

## 2021-01-07 DIAGNOSIS — R2689 Other abnormalities of gait and mobility: Secondary | ICD-10-CM | POA: Diagnosis not present

## 2021-01-07 DIAGNOSIS — M79605 Pain in left leg: Secondary | ICD-10-CM | POA: Diagnosis not present

## 2021-01-07 DIAGNOSIS — M5459 Other low back pain: Secondary | ICD-10-CM | POA: Diagnosis not present

## 2021-01-13 ENCOUNTER — Encounter: Payer: Self-pay | Admitting: Acute Care

## 2021-01-19 ENCOUNTER — Telehealth: Payer: Self-pay | Admitting: Neurology

## 2021-01-19 ENCOUNTER — Ambulatory Visit (INDEPENDENT_AMBULATORY_CARE_PROVIDER_SITE_OTHER): Payer: Medicare Other | Admitting: Neurology

## 2021-01-19 ENCOUNTER — Ambulatory Visit: Payer: Medicare Other | Admitting: Neurology

## 2021-01-19 ENCOUNTER — Encounter: Payer: Self-pay | Admitting: Neurology

## 2021-01-19 VITALS — BP 131/74 | HR 78 | Ht 64.0 in | Wt 218.0 lb

## 2021-01-19 DIAGNOSIS — R42 Dizziness and giddiness: Secondary | ICD-10-CM

## 2021-01-19 DIAGNOSIS — R269 Unspecified abnormalities of gait and mobility: Secondary | ICD-10-CM

## 2021-01-19 NOTE — Telephone Encounter (Signed)
I was not able to schedule this follow-up at check-out. Patient is aware that a nurse will call her to work her in.

## 2021-01-19 NOTE — Progress Notes (Addendum)
Chief Complaint  Patient presents with   New Patient (Initial Visit)    Rm 16, alone Vertigo and balance abnormality, today c/o vertigo event, states meclizine has not worked for her, reports headache today       ASSESSMENT AND PLAN  [name] is a 65 y.o. female   Acute onset of vertigo, unsteady gait  Multiple vascular risk factor, obesity, aging, hypertension hyperlipidemia, longtime smoker,  Need to rule out posterior circulation stroke  MRI of the brain  Refer to physical therapy, vestibular rehab  She is already on Xarelto with history of multiple DVT and PE   DIAGNOSTIC DATA (LABS, IMAGING, TESTING) - I reviewed patient records, labs, notes, testing and imaging myself where available.   MEDICAL HISTORY:  [name] is a 65 year old female, seen in request by PA Norm Salt, and her primary care physician Dr. Jackie Plum, for evaluation of vertigo, unsteady gait, initial evaluation was on January 19, 2021  I reviewed and summarized the referring note. PMHX HLD DM HTN PE, on xarelto Smoker  Patient already have baseline gait abnormality due to chronic low back pain, reported 1 episode of sudden onset dizziness in December 2021, she was bending down cleaning tub, When she raise up, she had a sudden onset of vertigo, she described feeling weird, spinning sensation, nausea, unsteady, lasting for few months, gradually improved  She was good for few months until recent trip in early September 2022 to the beach, she was standing in the water, the wave make her felt so unsteady, she has to be helped out,  Since then, she had a persistent unsteady, dizziness sensation, especially with sudden positional change, also noticed worsening gait abnormality, rely on her cane,    PHYSICAL EXAM:   Vitals:   01/19/21 0948  BP: 131/74  Pulse: 78  Weight: 218 lb (98.9 kg)  Height: 5\' 4"  (1.626 m)   Not recorded     Body mass index is 37.42  kg/m.  PHYSICAL EXAMNIATION:  Gen: NAD, conversant, well nourised, well groomed                     Cardiovascular: Regular rate rhythm, no peripheral edema, warm, nontender. Eyes: Conjunctivae clear without exudates or hemorrhage Neck: Supple, no carotid bruits. Pulmonary: Clear to auscultation bilaterally   NEUROLOGICAL EXAM:  MENTAL STATUS: Speech:    Speech is normal; fluent and spontaneous with normal comprehension.  Cognition:     Orientation to time, place and person     Normal recent and remote memory     Normal Attention span and concentration     Normal Language, naming, repeating,spontaneous speech     Fund of knowledge   CRANIAL NERVES: CN II: Visual fields are full to confrontation. Pupils are round equal and briskly reactive to light. CN III, IV, VI: extraocular movement are normal. No ptosis. CN V: Facial sensation is intact to light touch CN VII: Face is symmetric with normal eye closure  CN VIII: Hearing is normal to causal conversation. CN IX, X: Phonation is normal. CN XI: Head turning and shoulder shrug are intact  MOTOR: There is no pronator drift of out-stretched arms. Muscle bulk and tone are normal. Muscle strength is normal.  REFLEXES: Reflexes are 2+ and symmetric at the biceps, triceps, knees, and ankles. Plantar responses are flexor.  SENSORY: Intact to light touch, pinprick and vibratory sensation are intact in fingers and toes.  COORDINATION: There is no trunk or  limb dysmetria noted.  GAIT/STANCE: She needs push-up to get up from seated position, antalgic, wide-based, unsteady  REVIEW OF SYSTEMS:  Full 14 system review of systems performed and notable only for as above All other review of systems were negative.   ALLERGIES: Allergies  Allergen Reactions   Codeine     REACTION: itch   Codeine Phosphate     REACTION: itching   Tramadol-Acetaminophen     REACTION: itch    HOME MEDICATIONS: Current Outpatient Medications   Medication Sig Dispense Refill   albuterol (VENTOLIN HFA) 108 (90 Base) MCG/ACT inhaler albuterol sulfate HFA 90 mcg/actuation aerosol inhaler     amLODipine (NORVASC) 10 MG tablet amlodipine 10 mg tablet     ARIPiprazole (ABILIFY) 5 MG tablet aripiprazole 5 mg tablet     aspirin 325 MG EC tablet 1 tablet     celecoxib (CELEBREX) 200 MG capsule celecoxib 200 mg capsule  TAKE 1 CAPSULE BY MOUTH TWICE A DAY     CHANTIX CONTINUING MONTH PAK 1 MG tablet Take 1 mg by mouth 2 (two) times daily. (Patient not taking: Reported on 01/09/2020)     cloNIDine (CATAPRES) 0.1 MG tablet clonidine HCl 0.1 mg tablet     esomeprazole (NEXIUM) 40 MG capsule esomeprazole magnesium 40 mg capsule,delayed release  TAKE 1 CAPSULE BY MOUTH EVERY DAY     Fluticasone-Salmeterol (WIXELA INHUB) 250-50 MCG/DOSE AEPB Wixela Inhub 250 mcg-50 mcg/dose powder for inhalation     furosemide (LASIX) 40 MG tablet furosemide 40 mg tablet     glimepiride (AMARYL) 2 MG tablet 1 TAB(S) ONCE A DAY ORALLY 30 DAY(S)     hydrALAZINE (APRESOLINE) 25 MG tablet hydralazine 25 mg tablet     Ipratropium-Albuterol (COMBIVENT RESPIMAT) 20-100 MCG/ACT AERS respimat Combivent Respimat 20 mcg-100 mcg/actuation solution for inhalation     losartan (COZAAR) 50 MG tablet Take by mouth.     nicotine (NICODERM CQ) 14 mg/24hr patch Place 1 patch (14 mg total) onto the skin daily. 28 patch 3   nortriptyline (PAMELOR) 50 MG capsule nortriptyline 50 mg capsule     ondansetron (ZOFRAN) 4 MG tablet ondansetron HCl 4 mg tablet  TAKE 1 TABLET BY MOUTH EVERY 8 HOURS AS NEEDED     potassium chloride (KLOR-CON M10) 10 MEQ tablet Klor-Con M10 mEq tablet,extended release     rivaroxaban (XARELTO) 20 MG TABS tablet Xarelto 20 mg tablet     rosuvastatin (CRESTOR) 20 MG tablet rosuvastatin 20 mg tablet     STIOLTO RESPIMAT 2.5-2.5 MCG/ACT AERS INHALE 2 PUFFS BY MOUTH INTO THE LUNGS DAILY 4 g 2   sucralfate (CARAFATE) 1 GM/10ML suspension Carafate 100 mg/mL oral  suspension     VICTOZA 18 MG/3ML SOPN Inject 1.8 mg into the skin daily.     No current facility-administered medications for this visit.    PAST MEDICAL HISTORY: History reviewed. No pertinent past medical history.  PAST SURGICAL HISTORY: History reviewed. No pertinent surgical history.  FAMILY HISTORY: History reviewed. No pertinent family history.  SOCIAL HISTORY: Social History   Socioeconomic History   Marital status: Single    Spouse name: Not on file   Number of children: Not on file   Years of education: Not on file   Highest education level: Not on file  Occupational History   Not on file  Tobacco Use   Smoking status: Every Day    Packs/day: 1.50    Years: 55.00    Pack years: 82.50    Types: Cigarettes  Start date: 06/07/1964   Smokeless tobacco: Never  Substance and Sexual Activity   Alcohol use: Not on file   Drug use: Not on file   Sexual activity: Not on file  Other Topics Concern   Not on file  Social History Narrative   Not on file   Social Determinants of Health   Financial Resource Strain: Not on file  Food Insecurity: Not on file  Transportation Needs: Not on file  Physical Activity: Not on file  Stress: Not on file  Social Connections: Not on file  Intimate Partner Violence: Not on file      Levert Feinstein, M.D. Ph.D.  [facility] [address], [address] [place], [place] [address] Ph: [phone] Fax: [phone]  CC:  Norm Salt, [place] [address] [place],  [place] [address]  Jackie Plum, MD    Addendum: Laboratory evaluations on January 19, 2021, cholesterol 224, LDL 147, normal CMP, creatinine of 1, CBC showed hemoglobin of 11.6, A1c of 5.7, normal TSH 1.23, vitamin D of 18

## 2021-01-19 NOTE — Telephone Encounter (Signed)
The patient has been scheduled at 1pm (check-in 12:30) on 03/02/21. I left a message with the appt information. It will also show up on her mychart account.

## 2021-01-19 NOTE — Telephone Encounter (Signed)
[employer] medicare/medicaid order sent to [facility]. NPR they will reach out to the patient to schedule.  

## 2021-01-22 ENCOUNTER — Ambulatory Visit
Admission: RE | Admit: 2021-01-22 | Discharge: 2021-01-22 | Disposition: A | Discharge status: Home / Self Care | Payer: Medicare Other | Source: Ambulatory Visit | Attending: Neurology | Admitting: Neurology

## 2021-01-22 ENCOUNTER — Other Ambulatory Visit: Payer: Self-pay

## 2021-01-22 DIAGNOSIS — R269 Unspecified abnormalities of gait and mobility: Secondary | ICD-10-CM

## 2021-01-22 DIAGNOSIS — R42 Dizziness and giddiness: Secondary | ICD-10-CM

## 2021-01-22 MED ORDER — GADOBENATE DIMEGLUMINE 529 MG/ML IV SOLN
20.0000 mL | Freq: Once | INTRAVENOUS | Status: AC | PRN
  Administered 2021-01-22: 20 mL via INTRAVENOUS

## 2021-01-25 ENCOUNTER — Other Ambulatory Visit: Payer: Self-pay | Admitting: Pulmonary Disease

## 2021-01-29 DIAGNOSIS — K921 Melena: Secondary | ICD-10-CM | POA: Diagnosis not present

## 2021-01-29 DIAGNOSIS — R1310 Dysphagia, unspecified: Secondary | ICD-10-CM | POA: Diagnosis not present

## 2021-01-29 DIAGNOSIS — K222 Esophageal obstruction: Secondary | ICD-10-CM | POA: Diagnosis not present

## 2021-01-29 DIAGNOSIS — R1084 Generalized abdominal pain: Secondary | ICD-10-CM | POA: Diagnosis not present

## 2021-02-10 ENCOUNTER — Telehealth: Payer: Self-pay | Admitting: Neurology

## 2021-02-10 NOTE — Telephone Encounter (Signed)
Mychart message sent on 01/25/21:  Ms. [name]    MRI of brain showed no significant abnormalities, there was mild age-related changes.   If you have any questions about the report, please feel free to call office or MyChart message.   Levert Feinstein, M.D. Ph.D.  _____________________________________  I called the patient. She never read the message. She has been notified of her results.

## 2021-02-10 NOTE — Telephone Encounter (Signed)
Pt called wanting to know if her MRI results have come in. Please advise. 

## 2021-02-15 DIAGNOSIS — E559 Vitamin D deficiency, unspecified: Secondary | ICD-10-CM | POA: Diagnosis not present

## 2021-02-15 DIAGNOSIS — K5909 Other constipation: Secondary | ICD-10-CM | POA: Diagnosis not present

## 2021-02-15 DIAGNOSIS — H539 Unspecified visual disturbance: Secondary | ICD-10-CM | POA: Diagnosis not present

## 2021-02-15 DIAGNOSIS — E1140 Type 2 diabetes mellitus with diabetic neuropathy, unspecified: Secondary | ICD-10-CM | POA: Diagnosis not present

## 2021-02-15 DIAGNOSIS — E785 Hyperlipidemia, unspecified: Secondary | ICD-10-CM | POA: Diagnosis not present

## 2021-02-15 DIAGNOSIS — Z720 Tobacco use: Secondary | ICD-10-CM | POA: Diagnosis not present

## 2021-02-15 DIAGNOSIS — G629 Polyneuropathy, unspecified: Secondary | ICD-10-CM | POA: Diagnosis not present

## 2021-02-15 DIAGNOSIS — I872 Venous insufficiency (chronic) (peripheral): Secondary | ICD-10-CM | POA: Diagnosis not present

## 2021-02-15 DIAGNOSIS — R300 Dysuria: Secondary | ICD-10-CM | POA: Diagnosis not present

## 2021-02-15 DIAGNOSIS — Z7901 Long term (current) use of anticoagulants: Secondary | ICD-10-CM | POA: Diagnosis not present

## 2021-02-15 DIAGNOSIS — I10 Essential (primary) hypertension: Secondary | ICD-10-CM | POA: Diagnosis not present

## 2021-02-20 ENCOUNTER — Other Ambulatory Visit: Payer: Self-pay | Admitting: Pulmonary Disease

## 2021-03-01 DIAGNOSIS — R2689 Other abnormalities of gait and mobility: Secondary | ICD-10-CM | POA: Diagnosis not present

## 2021-03-01 DIAGNOSIS — M5459 Other low back pain: Secondary | ICD-10-CM | POA: Diagnosis not present

## 2021-03-01 DIAGNOSIS — M79605 Pain in left leg: Secondary | ICD-10-CM | POA: Diagnosis not present

## 2021-03-02 ENCOUNTER — Ambulatory Visit: Payer: Medicare Other | Admitting: Neurology

## 2021-03-04 NOTE — Progress Notes (Deleted)
Cardiology Office Note:    Date:  03/04/2021   ID:  [name], [name] Nov 12, 1955, MRN [id number]  PCP:  Jackie Plum, MD  Cardiologist:  Norman Herrlich, MD   Referring MD: Norm Salt, PA  ASSESSMENT:    No diagnosis found. PLAN:    In order of problems listed above:  ***  Next appointment   Medication Adjustments/Labs and Tests Ordered: Current medicines are reviewed at length with the patient today.  Concerns regarding medicines are outlined above.  No orders of the defined types were placed in this encounter.  No orders of the defined types were placed in this encounter.    No chief complaint on file. ***  History of Present Illness:    AUGUSTA MIRKIN is a 65 y.o. female who is being seen today for the evaluation of venous thromboembolism and anticoagulant therapy at the request of Norm Salt, [place]. She was seen by [facility] cardiology 03/13/2020 with cardiology complaints of potation and nonanginal chest pain her EKG showed incomplete right bundle branch block.  She had no diagnostic cardiac testing performed Medical problems include COPD diabetes hypertension and hyperlipidemia.  There also has a history of remote myocardial infarction decades ago when she was living in [place]. 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 No past medical history on file.  No past surgical history on file.  Current Medications: No outpatient medications have been marked as taking for the 03/05/21 encounter (Appointment) with Baldo Daub, MD.     Allergies:   Codeine, Codeine phosphate, and Tramadol-acetaminophen   Social History   Socioeconomic History   Marital status: Single    Spouse name: Not on file   Number of children: Not on file   Years of education: Not on file   Highest education level: Not on file  Occupational History   Not on file  Tobacco Use   Smoking status:  Every Day    Packs/day: 1.50    Years: 55.00    Pack years: 82.50    Types: Cigarettes    Start date: 06/07/1964   Smokeless tobacco: Never  Substance and Sexual Activity   Alcohol use: Not on file   Drug use: Not on file   Sexual activity: Not on file  Other Topics Concern   Not on file  Social History Narrative   Not on file   Social Determinants of Health   Financial Resource Strain: Not on file  Food Insecurity: Not on file  Transportation Needs: Not on file  Physical Activity: Not on file  Stress: Not on file  Social Connections: Not on file     Family History: The patient's ***family history is not on file.  ROS:   ROS Please see the history of present illness.    *** All other systems reviewed and are negative.  EKGs/Labs/Other Studies Reviewed:    The following studies were reviewed today: ***  EKG:  EKG is *** ordered today.  The ekg ordered today is personally reviewed and demonstrates ***  Recent Labs: No results found for requested labs within last 8760 hours.  Recent Lipid Panel    Component Value Date/Time   CHOL 150 07/23/2007 1023   TRIG 92 07/23/2007 1023   HDL 40.3 07/23/2007 1023   CHOLHDL 3.7 CALC 07/23/2007 1023   VLDL 18 07/23/2007 1023   LDLCALC 91 07/23/2007 1023   LDLDIRECT 165.0 04/13/2007 1287  Physical Exam:    VS:  There were no vitals taken for this visit.    Wt Readings from Last 3 Encounters:  01/19/21 218 lb (98.9 kg)  01/09/20 242 lb 3.2 oz (109.9 kg)  12/06/19 (!) 244 lb 6.4 oz (110.9 kg)     GEN: *** Well nourished, well developed in no acute distress HEENT: Normal NECK: No JVD; No carotid bruits LYMPHATICS: No lymphadenopathy CARDIAC: ***RRR, no murmurs, rubs, gallops RESPIRATORY:  Clear to auscultation without rales, wheezing or rhonchi  ABDOMEN: Soft, non-tender, non-distended MUSCULOSKELETAL:  No edema; No deformity  SKIN: Warm and dry NEUROLOGIC:  Alert and oriented x 3 PSYCHIATRIC:  Normal affect      Signed, Norman Herrlich, MD  03/04/2021 12:38 PM    [facility]

## 2021-03-05 ENCOUNTER — Ambulatory Visit: Payer: Medicare Other | Admitting: Cardiology

## 2021-03-12 ENCOUNTER — Other Ambulatory Visit: Payer: Self-pay | Admitting: Pulmonary Disease

## 2021-03-12 NOTE — Telephone Encounter (Signed)
Patient notified she is overdue for f/u. Patient scheduled f/u for 12/2 with RA.

## 2021-03-17 DIAGNOSIS — R2689 Other abnormalities of gait and mobility: Secondary | ICD-10-CM | POA: Diagnosis not present

## 2021-03-17 DIAGNOSIS — M5459 Other low back pain: Secondary | ICD-10-CM | POA: Diagnosis not present

## 2021-03-17 DIAGNOSIS — M79605 Pain in left leg: Secondary | ICD-10-CM | POA: Diagnosis not present

## 2021-03-18 DIAGNOSIS — M5459 Other low back pain: Secondary | ICD-10-CM | POA: Diagnosis not present

## 2021-03-18 DIAGNOSIS — M79605 Pain in left leg: Secondary | ICD-10-CM | POA: Diagnosis not present

## 2021-03-18 DIAGNOSIS — R2689 Other abnormalities of gait and mobility: Secondary | ICD-10-CM | POA: Diagnosis not present

## 2021-03-23 DIAGNOSIS — K5909 Other constipation: Secondary | ICD-10-CM | POA: Diagnosis not present

## 2021-03-23 DIAGNOSIS — D72818 Other decreased white blood cell count: Secondary | ICD-10-CM | POA: Diagnosis not present

## 2021-03-23 DIAGNOSIS — I872 Venous insufficiency (chronic) (peripheral): Secondary | ICD-10-CM | POA: Diagnosis not present

## 2021-03-23 DIAGNOSIS — E1140 Type 2 diabetes mellitus with diabetic neuropathy, unspecified: Secondary | ICD-10-CM | POA: Diagnosis not present

## 2021-03-23 DIAGNOSIS — Z23 Encounter for immunization: Secondary | ICD-10-CM | POA: Diagnosis not present

## 2021-03-23 DIAGNOSIS — E785 Hyperlipidemia, unspecified: Secondary | ICD-10-CM | POA: Diagnosis not present

## 2021-03-23 DIAGNOSIS — Z720 Tobacco use: Secondary | ICD-10-CM | POA: Diagnosis not present

## 2021-03-23 DIAGNOSIS — Z7901 Long term (current) use of anticoagulants: Secondary | ICD-10-CM | POA: Diagnosis not present

## 2021-03-23 DIAGNOSIS — G629 Polyneuropathy, unspecified: Secondary | ICD-10-CM | POA: Diagnosis not present

## 2021-03-23 DIAGNOSIS — Z0001 Encounter for general adult medical examination with abnormal findings: Secondary | ICD-10-CM | POA: Diagnosis not present

## 2021-03-23 DIAGNOSIS — I10 Essential (primary) hypertension: Secondary | ICD-10-CM | POA: Diagnosis not present

## 2021-03-25 DIAGNOSIS — M79605 Pain in left leg: Secondary | ICD-10-CM | POA: Diagnosis not present

## 2021-03-25 DIAGNOSIS — M5459 Other low back pain: Secondary | ICD-10-CM | POA: Diagnosis not present

## 2021-03-25 DIAGNOSIS — R2689 Other abnormalities of gait and mobility: Secondary | ICD-10-CM | POA: Diagnosis not present

## 2021-04-05 ENCOUNTER — Other Ambulatory Visit: Payer: Self-pay | Admitting: Pulmonary Disease

## 2021-04-07 DIAGNOSIS — M79605 Pain in left leg: Secondary | ICD-10-CM | POA: Diagnosis not present

## 2021-04-07 DIAGNOSIS — R2689 Other abnormalities of gait and mobility: Secondary | ICD-10-CM | POA: Diagnosis not present

## 2021-04-07 DIAGNOSIS — M5459 Other low back pain: Secondary | ICD-10-CM | POA: Diagnosis not present

## 2021-04-08 ENCOUNTER — Ambulatory Visit: Payer: Medicare Other | Admitting: Pulmonary Disease

## 2021-04-08 DIAGNOSIS — M5459 Other low back pain: Secondary | ICD-10-CM | POA: Diagnosis not present

## 2021-04-08 DIAGNOSIS — R2689 Other abnormalities of gait and mobility: Secondary | ICD-10-CM | POA: Diagnosis not present

## 2021-04-08 DIAGNOSIS — M79605 Pain in left leg: Secondary | ICD-10-CM | POA: Diagnosis not present

## 2021-04-12 NOTE — Progress Notes (Signed)
Cardiology Office Note:    Date:  04/13/2021   ID:  [name], [name] May 06, 1956, MRN [id number]  PCP:  Jackie Plum, MD  Cardiologist:  Norman Herrlich, MD   Referring MD: Norm Salt, [place]  ASSESSMENT:    1. Chest pain of uncertain etiology   2. Chronic anticoagulation   3. History of thrombophlebitis   4. Primary hypertension   5. Mixed hyperlipidemia   6. Type 2 diabetes mellitus with complication, without long-term current use of insulin (HCC)   7. COPD with chronic bronchitis and emphysema (HCC)   8. Coronary artery calcification seen on CT scan    PLAN:    In order of problems listed above:  She is very high risk for obstructive CAD age hypertension hyperlipidemia type 2 diabetes and coronary artery calcification.  We discussed further evaluation and decided to go ahead and do cardiac CTA to define the presence or absence of CAD severity and guide therapy if she would need revascularization for high risk findings of flow-limiting stenosis.  She has no dye allergy and renal function is normal her creatinine 11/26/2020 was 0.92 potassium 4.1.  I will see her back in the office in 6 to 8 weeks and follow-up She is hypercoagulable with both DVT and pulmonary embolism unprovoked without a modifiable risk factor and should remain on lifelong anticoagulation and I agree that direct anticoagulant Xarelto is appropriate.  She is on aspirin places her at increased risk of bleeding complication no discrete indication and I will ask her to discontinue Stable blood pressure is at target continue current medical therapy including amlodipine diuretic Continue statin recently started Stable managed by her PCP her most recent A1c 7.4% Managed by her PCP See above she undergo cardiac CTA  Next appointment 8 weeks   Medication Adjustments/Labs and Tests Ordered: Current medicines are reviewed at length with the patient today.  Concerns regarding medicines are outlined above.   Orders Placed This Encounter  Procedures   CT CORONARY MORPH W/CTA COR W/SCORE W/CA W/CM &/OR WO/CM   Basic metabolic panel   EKG 12-Lead    No orders of the defined types were placed in this encounter.    Chief Complaint  Patient presents with   Anticoagulation    Referral notes relate the reason for referral is management of anticoagulation    History of Present Illness:    [name] is a 65 y.o. female who is being seen today for anticoagulation management with a history of venous thromboembolism currently on warfarin anticoagulation at the request of Norm Salt, [place].  She was seen in cardiology consultation [facility] 03/13/2020 she has a history of venous thromboembolism with deep vein thrombosis pulmonary embolism hypertension hyperlipidemia diabetes mellitus and cigarette smoking.  She also has a history of obstructive sleep apnea.  At that time she was maintained on anticoagulant therapy and her clinical problems included palpitation and chest pain.  A cardiac echo was ordered but does not seem to have been performed.  She has not been seen by cardiology at [facility] health.  She has been seen by pulmonary office visit 12/06/2019 for COPD and obstructive sleep apnea.  Chart review shows CT lower extremity 09 13 2006 showing acute left lower extremity DVT. Record [facility] 02/20/2013 details as she has initiated on warfarin in 2011 by Dr. Reuel Boom.  She had CT of the chest lung cancer screening low-dose without contrast 01/09/2020 which did show the presence of emphysema and  coronary artery calcification and aortic atherosclerosis.  She had a myocardial perfusion study performed 2006 pharmacologically with adenosine.  The images describes severe breast attenuation with no evidence of reversibility or ischemia ejection fraction 58% with normal systolic function and the conclusion was probably normal stress myocardial perfusion study.  Recent labs are  12/29/2020: Cholesterol 224 LDL 147 triglycerides 72 HDL 60 Creatinine 1.0 GFR 63 cc/min sodium 141 potassium 4.0 hemoglobin 11.6 mildly anemic MCV is normal platelets 251,000 A1c 5.7% TSH normal 1.23 Last INR that I find in care everywhere is May 2019 2.8  She has very good healthcare insight tells me she had unprovoked DVT 2006 took anticoagulant for short period of time and then a pulmonary embolism in 2011.  From her description she has had a hematologic screen no discrete abnormality but was judged hypercoagulable and she has been on Xarelto since 2019 She is compliant with her anticoagulant no bleeding complication She is aware that she has coronary artery calcification said someone told her in the past that she had a mild degree of blockage in one of her heart arteries.  I do not think she has ever had a coronary angiogram. Recently she was placed on high intensity statin for hyperlipidemia She is having increased frequency of substernal chest pain tightness with and without activity and is concerned regarding CAD She is not having edema shortness of breath palpitation or syncope. Past Medical History:  Diagnosis Date   COPD (chronic obstructive pulmonary disease) (HCC)    Coronary artery calcification seen on CT scan    Diabetes (HCC)    DVT (deep venous thrombosis) (HCC)    Hyperlipidemia    Hypertension    Observed sleep apnea     Past Surgical History:  Procedure Laterality Date   ABDOMINAL HYSTERECTOMY     KNEE ARTHROSCOPY Bilateral    TOTAL KNEE ARTHROPLASTY Bilateral     Current Medications: Current Meds  Medication Sig   albuterol (VENTOLIN HFA) 108 (90 Base) MCG/ACT inhaler 2 puffs every 4 (four) hours as needed for wheezing or shortness of breath.   amLODipine (NORVASC) 10 MG tablet amlodipine 10 mg tablet   aspirin 325 MG EC tablet 325 mg daily.   celecoxib (CELEBREX) 400 MG capsule Take 400 mg by mouth 2 (two) times daily.   cyclobenzaprine (FLEXERIL) 10 MG  tablet Take 5-10 mg by mouth 3 (three) times daily as needed for muscle spasms.   diclofenac (VOLTAREN) 50 MG EC tablet Take 50 mg by mouth 2 (two) times daily as needed for pain.   diclofenac Sodium (VOLTAREN) 1 % GEL Apply 1 application topically 4 (four) times daily.   DULoxetine (CYMBALTA) 60 MG capsule Take 120 mg by mouth daily.   esomeprazole (NEXIUM) 40 MG capsule esomeprazole magnesium 40 mg capsule,delayed release  TAKE 1 CAPSULE BY MOUTH EVERY DAY   furosemide (LASIX) 40 MG tablet 40 mg daily as needed for fluid or edema.   hydrALAZINE (APRESOLINE) 25 MG tablet Take 25 mg by mouth daily.   Ipratropium-Albuterol (COMBIVENT RESPIMAT) 20-100 MCG/ACT AERS respimat 1 puff every 6 (six) hours as needed for wheezing or shortness of breath.   losartan (COZAAR) 100 MG tablet Take 100 mg by mouth daily.   meclizine (ANTIVERT) 25 MG tablet Take 25 mg by mouth 3 (three) times daily as needed for dizziness.   nortriptyline (PAMELOR) 50 MG capsule nortriptyline 50 mg capsule   ondansetron (ZOFRAN) 4 MG tablet Take 4 mg by mouth every 8 (eight) hours as  needed for nausea.   potassium chloride (KLOR-CON M) 10 MEQ tablet 10 mEq daily as needed (When taking lasix for edema).   rivaroxaban (XARELTO) 20 MG TABS tablet 20 mg daily with supper.   rosuvastatin (CRESTOR) 20 MG tablet Take 20 mg by mouth daily.   Tiotropium Bromide-Olodaterol (STIOLTO RESPIMAT) 2.5-2.5 MCG/ACT AERS INHALE 2 PUFFS BY MOUTH INTO THE LUNGS DAILY   VICTOZA 18 MG/3ML SOPN Inject 1.8 mg into the skin daily.   Vitamin D, Ergocalciferol, (DRISDOL) 1.25 MG (50000 UNIT) CAPS capsule Take 50,000 Units by mouth once a week.     Allergies:   Codeine, Codeine phosphate, and Tramadol-acetaminophen   Social History   Socioeconomic History   Marital status: Single    Spouse name: Not on file   Number of children: Not on file   Years of education: Not on file   Highest education level: Not on file  Occupational History   Not on file   Tobacco Use   Smoking status: Every Day    Packs/day: 1.50    Years: 55.00    Pack years: 82.50    Types: Cigarettes    Start date: 06/07/1964    Passive exposure: Current   Smokeless tobacco: Never  Vaping Use   Vaping Use: Never used  Substance and Sexual Activity   Alcohol use: Not Currently   Drug use: Yes    Types: Marijuana, "Crack" cocaine    Comment: None since 1999   Sexual activity: Not on file  Other Topics Concern   Not on file  Social History Narrative   Not on file   Social Determinants of Health   Financial Resource Strain: Not on file  Food Insecurity: Not on file  Transportation Needs: Not on file  Physical Activity: Not on file  Stress: Not on file  Social Connections: Not on file     Family History: The patient's family history is noteworthy for DVT and pulmonary embolism and almost all of her first-degree relatives  ROS:   ROS Please see the history of present illness.     All other systems reviewed and are negative.  EKGs/Labs/Other Studies Reviewed:    The following studies were reviewed today:   EKG:  EKG is  ordered today.  The ekg ordered today is personally reviewed and demonstrates left axis deviation left anterior hemiblock  Recent Labs: No results found for requested labs within last 8760 hours.  Recent Lipid Panel    Component Value Date/Time   CHOL 150 07/23/2007 1023   TRIG 92 07/23/2007 1023   HDL 40.3 07/23/2007 1023   CHOLHDL 3.7 CALC 07/23/2007 1023   VLDL 18 07/23/2007 1023   LDLCALC 91 07/23/2007 1023   LDLDIRECT 165.0 04/13/2007 0938    Physical Exam:    VS:  BP 122/76   Pulse 84   Ht 5\' 3"  (1.6 m)   Wt 224 lb 1.9 oz (101.7 kg)   SpO2 95%   BMI 39.70 kg/m     Wt Readings from Last 3 Encounters:  04/13/21 224 lb 1.9 oz (101.7 kg)  01/19/21 218 lb (98.9 kg)  01/09/20 242 lb 3.2 oz (109.9 kg)     GEN: Appears her age well nourished, well developed in no acute distress HEENT: Normal NECK: No JVD; No  carotid bruits LYMPHATICS: No lymphadenopathy CARDIAC: RRR, no murmurs, rubs, gallops RESPIRATORY:  Clear to auscultation without rales, wheezing or rhonchi  ABDOMEN: Soft, non-tender, non-distended MUSCULOSKELETAL:  No edema; No deformity  SKIN:  Warm and dry NEUROLOGIC:  Alert and oriented x 3 PSYCHIATRIC:  Normal affect     Signed, Norman Herrlich, MD  04/13/2021 9:54 AM    [facility]

## 2021-04-13 ENCOUNTER — Ambulatory Visit (INDEPENDENT_AMBULATORY_CARE_PROVIDER_SITE_OTHER): Payer: Medicare Other | Admitting: Cardiology

## 2021-04-13 ENCOUNTER — Other Ambulatory Visit: Payer: Self-pay

## 2021-04-13 ENCOUNTER — Encounter: Payer: Self-pay | Admitting: Cardiology

## 2021-04-13 VITALS — BP 122/76 | HR 84 | Ht 63.0 in | Wt 224.1 lb

## 2021-04-13 DIAGNOSIS — I10 Essential (primary) hypertension: Secondary | ICD-10-CM | POA: Diagnosis not present

## 2021-04-13 DIAGNOSIS — R079 Chest pain, unspecified: Secondary | ICD-10-CM

## 2021-04-13 DIAGNOSIS — Z8672 Personal history of thrombophlebitis: Secondary | ICD-10-CM

## 2021-04-13 DIAGNOSIS — J449 Chronic obstructive pulmonary disease, unspecified: Secondary | ICD-10-CM

## 2021-04-13 DIAGNOSIS — E782 Mixed hyperlipidemia: Secondary | ICD-10-CM

## 2021-04-13 DIAGNOSIS — Z7901 Long term (current) use of anticoagulants: Secondary | ICD-10-CM

## 2021-04-13 DIAGNOSIS — E118 Type 2 diabetes mellitus with unspecified complications: Secondary | ICD-10-CM | POA: Diagnosis not present

## 2021-04-13 DIAGNOSIS — I2510 Atherosclerotic heart disease of native coronary artery without angina pectoris: Secondary | ICD-10-CM | POA: Insufficient documentation

## 2021-04-13 MED ORDER — METOPROLOL TARTRATE 100 MG PO TABS: 100.0000 mg | ORAL_TABLET | Freq: Once | ORAL | 0 refills | Status: DC

## 2021-04-13 NOTE — Patient Instructions (Addendum)
Medication Instructions:  Your physician has recommended you make the following change in your medication:  STOP: ASPIRIN  *If you need a refill on your cardiac medications before your next appointment, please call your pharmacy*   Lab Work: Your physician recommends that you return for lab work in: WITHIN ONE WEEK OF YOUR CARDIAC CT BMP If you have labs (blood work) drawn today and your tests are completely normal, you will receive your results only by: MyChart Message (if you have MyChart) OR A paper copy in the mail If you have any lab test that is abnormal or we need to change your treatment, we will call you to review the results.   Testing/Procedures:   Your cardiac CT will be scheduled at the below location:   [facility] [address] [place], [place] [address] [phone]  If scheduled at [facility], please arrive at the [facility] main entrance (entrance A) of [facility] [place] 30 minutes prior to test start time. You can use the FREE valet parking offered at the main entrance (encouraged to control the heart rate for the test) Proceed to the [facility] Radiology Department (first floor) to check-in and test prep.   Please follow these instructions carefully (unless otherwise directed):  On the Night Before the Test: Be sure to Drink plenty of water. Do not consume any caffeinated/decaffeinated beverages or chocolate 12 hours prior to your test. Do not take any antihistamines 12 hours prior to your test.  On the Day of the Test: Drink plenty of water until 1 hour prior to the test. Do not eat any food 4 hours prior to the test. You may take your regular medications prior to the test.  Take metoprolol (Lopressor) two hours prior to test. HOLD Furosemide morning of the test. FEMALES- please wear underwire-free bra if available, avoid dresses & tight clothing  After the Test: Drink plenty of water. After receiving IV contrast, you may  experience a mild flushed feeling. This is normal. On occasion, you may experience a mild rash up to 24 hours after the test. This is not dangerous. If this occurs, you can take Benadryl 25 mg and increase your fluid intake. If you experience trouble breathing, this can be serious. If it is severe call 911 IMMEDIATELY. If it is mild, please call our office. If you take any of these medications: Glipizide/Metformin, Avandament, Glucavance, please do not take 48 hours after completing test unless otherwise instructed.  Please allow 2-4 weeks for scheduling of routine cardiac CTs. Some insurance companies require a pre-authorization which may delay scheduling of this test.   For non-scheduling related questions, please contact the cardiac imaging nurse navigator should you have any questions/concerns: Rockwell [place], Cardiac Imaging Nurse Navigator Larey Brick, Cardiac Imaging Nurse Navigator [facility] Direct Office Dial: [phone]   For scheduling needs, including cancellations and rescheduling, please call [place], [phone].    Follow-Up: At [facility], you and your health needs are our priority.  As part of our continuing mission to provide you with exceptional heart care, we have created designated Provider Care Teams.  These Care Teams include your primary Cardiologist (physician) and Advanced Practice Providers (APPs -  Physician Assistants and Nurse Practitioners) who all work together to provide you with the care you need, when you need it.  We recommend signing up for the patient portal called "MyChart".  Sign up information is provided on this After Visit Summary.  MyChart is  used to connect with patients for Virtual Visits (Telemedicine).  Patients are able to view lab/test results, encounter notes, upcoming appointments, etc.  Non-urgent messages can be sent to your provider as well.   To learn more about what you can do with MyChart, go to  [email or web address].    Your next appointment:   8 week(s)  The format for your next appointment:   In Person  Provider:   Norman Herrlich, MD    Other Instructions

## 2021-04-15 DIAGNOSIS — M79605 Pain in left leg: Secondary | ICD-10-CM | POA: Diagnosis not present

## 2021-04-15 DIAGNOSIS — M5459 Other low back pain: Secondary | ICD-10-CM | POA: Diagnosis not present

## 2021-04-15 DIAGNOSIS — R2689 Other abnormalities of gait and mobility: Secondary | ICD-10-CM | POA: Diagnosis not present

## 2021-04-27 DIAGNOSIS — R52 Pain, unspecified: Secondary | ICD-10-CM | POA: Diagnosis not present

## 2021-04-27 DIAGNOSIS — I10 Essential (primary) hypertension: Secondary | ICD-10-CM | POA: Diagnosis not present

## 2021-04-27 DIAGNOSIS — Z720 Tobacco use: Secondary | ICD-10-CM | POA: Diagnosis not present

## 2021-04-27 DIAGNOSIS — E1140 Type 2 diabetes mellitus with diabetic neuropathy, unspecified: Secondary | ICD-10-CM | POA: Diagnosis not present

## 2021-04-27 DIAGNOSIS — Z7901 Long term (current) use of anticoagulants: Secondary | ICD-10-CM | POA: Diagnosis not present

## 2021-04-27 DIAGNOSIS — K5909 Other constipation: Secondary | ICD-10-CM | POA: Diagnosis not present

## 2021-04-27 DIAGNOSIS — G629 Polyneuropathy, unspecified: Secondary | ICD-10-CM | POA: Diagnosis not present

## 2021-04-27 DIAGNOSIS — I872 Venous insufficiency (chronic) (peripheral): Secondary | ICD-10-CM | POA: Diagnosis not present

## 2021-04-27 DIAGNOSIS — E785 Hyperlipidemia, unspecified: Secondary | ICD-10-CM | POA: Diagnosis not present

## 2021-04-27 DIAGNOSIS — L0292 Furuncle, unspecified: Secondary | ICD-10-CM | POA: Diagnosis not present

## 2021-04-27 DIAGNOSIS — M5416 Radiculopathy, lumbar region: Secondary | ICD-10-CM | POA: Diagnosis not present

## 2021-04-27 DIAGNOSIS — M79673 Pain in unspecified foot: Secondary | ICD-10-CM | POA: Diagnosis not present

## 2021-04-27 DIAGNOSIS — D72818 Other decreased white blood cell count: Secondary | ICD-10-CM | POA: Diagnosis not present

## 2021-05-06 ENCOUNTER — Telehealth (HOSPITAL_COMMUNITY): Payer: Self-pay | Admitting: *Deleted

## 2021-05-06 NOTE — Telephone Encounter (Signed)
Reaching out to patient to offer assistance regarding upcoming cardiac imaging study; pt verbalizes understanding of appt date/time, but patient requesting to be rescheduled. Patient's mother recently passed away and the service is on the day of her CCTA. Patient has been rescheduled to January 5 at 14:00. Patient is aware to obtain blood work prior to appointment.   Larey Brick RN Navigator Cardiac Imaging [facility] [phone] office [phone] cell

## 2021-05-07 DIAGNOSIS — M19079 Primary osteoarthritis, unspecified ankle and foot: Secondary | ICD-10-CM | POA: Diagnosis not present

## 2021-05-07 DIAGNOSIS — M2141 Flat foot [pes planus] (acquired), right foot: Secondary | ICD-10-CM | POA: Diagnosis not present

## 2021-05-07 DIAGNOSIS — M2142 Flat foot [pes planus] (acquired), left foot: Secondary | ICD-10-CM | POA: Diagnosis not present

## 2021-05-11 ENCOUNTER — Ambulatory Visit (HOSPITAL_COMMUNITY): Admission: RE | Admit: 2021-05-11 | Payer: Medicare Other | Source: Ambulatory Visit

## 2021-05-12 ENCOUNTER — Telehealth (HOSPITAL_COMMUNITY): Payer: Self-pay | Admitting: *Deleted

## 2021-05-12 NOTE — Telephone Encounter (Signed)
Reaching out to patient to offer assistance regarding upcoming cardiac imaging study; pt verbalizes understanding of appt date/time, parking situation and where to check in, pre-test NPO status and medications ordered; name and call back number provided for further questions should they arise  Larey Brick RN Navigator Cardiac Imaging [facility] [phone] office [phone] cell  Patient to take 100mg  metoprolol tartrate two hours prior to cardiac CT scan. She is aware to arrive at 1:30pm for her 2pm scan and to obtain blood work prior to appointment.

## 2021-05-13 ENCOUNTER — Other Ambulatory Visit: Payer: Self-pay

## 2021-05-13 ENCOUNTER — Telehealth: Payer: Self-pay

## 2021-05-13 ENCOUNTER — Ambulatory Visit (HOSPITAL_COMMUNITY)
Admission: RE | Admit: 2021-05-13 | Discharge: 2021-05-13 | Disposition: A | Discharge status: Home / Self Care | Payer: Medicare Other | Source: Ambulatory Visit | Attending: Cardiology | Admitting: Cardiology

## 2021-05-13 DIAGNOSIS — I2510 Atherosclerotic heart disease of native coronary artery without angina pectoris: Secondary | ICD-10-CM

## 2021-05-13 DIAGNOSIS — R079 Chest pain, unspecified: Secondary | ICD-10-CM | POA: Diagnosis present

## 2021-05-13 LAB — BASIC METABOLIC PANEL
BUN/Creatinine Ratio: 16 (ref 12–28)
BUN: 16 mg/dL (ref 8–27)
CO2: 25 mmol/L (ref 20–29)
Calcium: 9.1 mg/dL (ref 8.7–10.3)
Chloride: 103 mmol/L (ref 96–106)
Creatinine, Ser: 1.03 mg/dL — ABNORMAL HIGH (ref 0.57–1.00)
Glucose: 85 mg/dL (ref 70–99)
Potassium: 3.9 mmol/L (ref 3.5–5.2)
Sodium: 144 mmol/L (ref 134–144)
eGFR: 60 mL/min/{1.73_m2} (ref >59)

## 2021-05-13 MED ORDER — NITROGLYCERIN 0.4 MG SL SUBL
0.8000 mg | SUBLINGUAL_TABLET | Freq: Once | SUBLINGUAL | Status: AC
  Administered 2021-05-13: 0.8 mg via SUBLINGUAL

## 2021-05-13 MED ORDER — IOHEXOL 350 MG/ML SOLN
95.0000 mL | Freq: Once | INTRAVENOUS | Status: AC | PRN
  Administered 2021-05-13: 95 mL via INTRAVENOUS

## 2021-05-13 MED ORDER — NITROGLYCERIN 0.4 MG SL SUBL
SUBLINGUAL_TABLET | SUBLINGUAL | Status: AC
  Filled 2021-05-13: qty 2

## 2021-05-13 NOTE — Telephone Encounter (Signed)
Spoke with patient regarding results and recommendation.  Patient verbalizes understanding and is agreeable to plan of care. Advised patient to call back with any issues or concerns.  

## 2021-05-13 NOTE — Telephone Encounter (Signed)
-----   Message from Baldo Daub, MD sent at 05/13/2021  8:16 AM EST ----- Normal or stable result

## 2021-05-14 ENCOUNTER — Telehealth: Payer: Self-pay

## 2021-05-14 DIAGNOSIS — I2510 Atherosclerotic heart disease of native coronary artery without angina pectoris: Secondary | ICD-10-CM

## 2021-05-14 NOTE — Telephone Encounter (Signed)
Spoke with patient regarding results and recommendation.  Patient verbalizes understanding and is agreeable to plan of care. Advised patient to call back with any issues or concerns.  

## 2021-05-14 NOTE — Telephone Encounter (Signed)
-----   Message from Richardo Priest, MD sent at 05/14/2021  8:10 AM EST ----- She has a very high coronary calcium score and really needs to have her lipids treated optimally I like her to come and have a fasting lipid profile as well as an LP(a) level.  There is atherosclerotic plaque in the coronary arteries but no severe blockage.  We can discuss further at her scheduled office follow-up.

## 2021-05-27 DIAGNOSIS — I10 Essential (primary) hypertension: Secondary | ICD-10-CM | POA: Insufficient documentation

## 2021-05-27 DIAGNOSIS — I82409 Acute embolism and thrombosis of unspecified deep veins of unspecified lower extremity: Secondary | ICD-10-CM | POA: Insufficient documentation

## 2021-06-03 ENCOUNTER — Encounter: Payer: Self-pay | Admitting: Cardiology

## 2021-06-03 ENCOUNTER — Ambulatory Visit (INDEPENDENT_AMBULATORY_CARE_PROVIDER_SITE_OTHER): Payer: 59 | Admitting: Cardiology

## 2021-06-03 ENCOUNTER — Other Ambulatory Visit: Payer: Self-pay

## 2021-06-03 VITALS — BP 143/79 | HR 90 | Ht 63.0 in | Wt 230.4 lb

## 2021-06-03 DIAGNOSIS — I10 Essential (primary) hypertension: Secondary | ICD-10-CM | POA: Diagnosis not present

## 2021-06-03 DIAGNOSIS — I2510 Atherosclerotic heart disease of native coronary artery without angina pectoris: Secondary | ICD-10-CM

## 2021-06-03 DIAGNOSIS — E782 Mixed hyperlipidemia: Secondary | ICD-10-CM | POA: Diagnosis not present

## 2021-06-03 MED ORDER — NITROGLYCERIN 0.4 MG SL SUBL: 0.4000 mg | SUBLINGUAL_TABLET | SUBLINGUAL | 3 refills | Status: DC | PRN

## 2021-06-03 MED ORDER — METOPROLOL SUCCINATE ER 25 MG PO TB24: 25.0000 mg | ORAL_TABLET | Freq: Every day | ORAL | 3 refills | Status: DC

## 2021-06-03 MED ORDER — ISOSORBIDE MONONITRATE ER 30 MG PO TB24: 30.0000 mg | ORAL_TABLET | Freq: Every day | ORAL | 3 refills | Status: DC

## 2021-06-03 NOTE — Progress Notes (Signed)
Cardiology Office Note:    Date:  06/03/2021   ID:  Mary, [name] 20-May-1955, MRN [id number]  PCP:  Jackie Plum, MD  Cardiologist:  Norman Herrlich, MD    Referring MD: Jackie Plum, MD    ASSESSMENT:    1. Mild CAD   2. Coronary artery calcification seen on CT scan   3. Primary hypertension   4. Mixed hyperlipidemia    PLAN:    In order of problems listed above:  She has mild CAD on coronary CTA but is having intermittent angina we will intensify medical therapy adding oral nitrate beta-blocker as needed nitroglycerin continue antihypertensive agents and lipid-lowering. Continue anticoagulant   Next appointment: 1 year   Medication Adjustments/Labs and Tests Ordered: Current medicines are reviewed at length with the patient today.  Concerns regarding medicines are outlined above.  Orders Placed This Encounter  Procedures   Comprehensive metabolic panel   Lipid panel   Meds ordered this encounter  Medications   isosorbide mononitrate (IMDUR) 30 MG 24 hr tablet    Sig: Take 1 tablet (30 mg total) by mouth daily.    Dispense:  90 tablet    Refill:  3   metoprolol succinate (TOPROL XL) 25 MG 24 hr tablet    Sig: Take 1 tablet (25 mg total) by mouth daily.    Dispense:  90 tablet    Refill:  3   nitroGLYCERIN (NITROSTAT) 0.4 MG SL tablet    Sig: Place 1 tablet (0.4 mg total) under the tongue every 5 (five) minutes as needed.    Dispense:  30 tablet    Refill:  3    Chief complaint follow-up after cardiac CTA   History of Present Illness:    [name] is a 66 y.o. female with a history of venous thromboembolism  and pulmonary embolism on Xarelto hypertension hyperlipidemia obstructive sleep apnea COPD and coronary artery calcification.  Anticoagulation  last seen 04/13/2021.  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  She had hematology evaluation was judged to be  hypercoagulable and has been on Xarelto since 2019.  She has coronary artery calcification on CT scan.  She is also on a statin for hyperlipidemia.  When seen in the office she was having episodes of chest pain and was referred for coronary CTA. Compliance with diet, lifestyle and medications: Yes  Coronary CTA performed 05/13/2021 showed a score of 386 94th percentile she had extensive coronary plaque diffusely but no focal stenotic lesion most severe 25 to 49% in the left circumflex and right coronary artery.  She continues to have exertional chest discomfort.  I will place on oral mononitrate low-dose beta-blocker prescription for nitroglycerin as needed.  My expectation is that his symptoms will improve or resolve.  She is on appropriate therapy including lipid-lowering with a high intensity statin. Past Medical History:  Diagnosis Date   COPD (chronic obstructive pulmonary disease) (HCC)    Coronary artery calcification seen on CT scan    Diabetes (HCC)    DVT (deep venous thrombosis) (HCC)    Hyperlipidemia    Hypertension    Observed sleep apnea     Past Surgical History:  Procedure Laterality Date   ABDOMINAL HYSTERECTOMY     KNEE ARTHROSCOPY Bilateral    TOTAL KNEE ARTHROPLASTY Bilateral     Current Medications: Current Meds  Medication Sig   albuterol (VENTOLIN HFA) 108 (90 Base) MCG/ACT  inhaler 2 puffs every 4 (four) hours as needed for wheezing or shortness of breath.   amLODipine (NORVASC) 10 MG tablet amlodipine 10 mg tablet   celecoxib (CELEBREX) 400 MG capsule Take 400 mg by mouth 2 (two) times daily.   cyclobenzaprine (FLEXERIL) 10 MG tablet Take 5-10 mg by mouth 3 (three) times daily as needed for muscle spasms.   diclofenac (VOLTAREN) 50 MG EC tablet Take 50 mg by mouth 2 (two) times daily as needed for pain.   diclofenac Sodium (VOLTAREN) 1 % GEL Apply 1 application topically 4 (four) times daily.   DULoxetine (CYMBALTA) 60 MG capsule Take 120 mg by mouth daily.    esomeprazole (NEXIUM) 40 MG capsule esomeprazole magnesium 40 mg capsule,delayed release  TAKE 1 CAPSULE BY MOUTH EVERY DAY   furosemide (LASIX) 40 MG tablet 40 mg daily as needed for fluid or edema.   hydrALAZINE (APRESOLINE) 25 MG tablet Take 25 mg by mouth daily.   Ipratropium-Albuterol (COMBIVENT RESPIMAT) 20-100 MCG/ACT AERS respimat 1 puff every 6 (six) hours as needed for wheezing or shortness of breath.   isosorbide mononitrate (IMDUR) 30 MG 24 hr tablet Take 1 tablet (30 mg total) by mouth daily.   losartan (COZAAR) 100 MG tablet Take 100 mg by mouth daily.   meclizine (ANTIVERT) 25 MG tablet Take 25 mg by mouth 3 (three) times daily as needed for dizziness.   metoprolol succinate (TOPROL XL) 25 MG 24 hr tablet Take 1 tablet (25 mg total) by mouth daily.   nitroGLYCERIN (NITROSTAT) 0.4 MG SL tablet Place 1 tablet (0.4 mg total) under the tongue every 5 (five) minutes as needed.   nortriptyline (PAMELOR) 50 MG capsule nortriptyline 50 mg capsule   ondansetron (ZOFRAN) 4 MG tablet Take 4 mg by mouth every 8 (eight) hours as needed for nausea.   potassium chloride (KLOR-CON M) 10 MEQ tablet 10 mEq daily as needed (When taking lasix for edema).   rivaroxaban (XARELTO) 20 MG TABS tablet 20 mg daily with supper.   rosuvastatin (CRESTOR) 20 MG tablet Take 20 mg by mouth daily.   Tiotropium Bromide-Olodaterol (STIOLTO RESPIMAT) 2.5-2.5 MCG/ACT AERS INHALE 2 PUFFS BY MOUTH INTO THE LUNGS DAILY   VICTOZA 18 MG/3ML SOPN Inject 1.8 mg into the skin daily.   Vitamin D, Ergocalciferol, (DRISDOL) 1.25 MG (50000 UNIT) CAPS capsule Take 50,000 Units by mouth once a week.     Allergies:   Codeine, Codeine phosphate, and Tramadol-acetaminophen   Social History   Socioeconomic History   Marital status: Single    Spouse name: Not on file   Number of children: Not on file   Years of education: Not on file   Highest education level: Not on file  Occupational History   Not on file  Tobacco Use    Smoking status: Every Day    Packs/day: 1.50    Years: 55.00    Pack years: 82.50    Types: Cigarettes    Start date: 06/07/1964    Passive exposure: Current   Smokeless tobacco: Never  Vaping Use   Vaping Use: Never used  Substance and Sexual Activity   Alcohol use: Not Currently   Drug use: Yes    Types: Marijuana, "Crack" cocaine    Comment: None since 1999   Sexual activity: Not on file  Other Topics Concern   Not on file  Social History Narrative   Not on file   Social Determinants of Health   Financial Resource Strain: Not on file  Food Insecurity: Not on file  Transportation Needs: Not on file  Physical Activity: Not on file  Stress: Not on file  Social Connections: Not on file     Family History: The patient's family history includes Diabetes in her father; Healthy in her sister; Heart Problems in her mother; Heart attack in her father; Hypertension in her mother; Pulmonary embolism in her brother, brother, mother, and sister. ROS:   Please see the history of present illness.    All other systems reviewed and are negative.  EKGs/Labs/Other Studies Reviewed:    The following studies were reviewed today:   Recent Labs: 05/12/2021: BUN 16; Creatinine, Ser 1.03; Potassium 3.9; Sodium 144  Recent Lipid Panel    Component Value Date/Time   CHOL 150 07/23/2007 1023   TRIG 92 07/23/2007 1023   HDL 40.3 07/23/2007 1023   CHOLHDL 3.7 CALC 07/23/2007 1023   VLDL 18 07/23/2007 1023   LDLCALC 91 07/23/2007 1023   LDLDIRECT 165.0 04/13/2007 0938    Physical Exam:    VS:  BP (!) 143/79 (BP Location: Right Arm, Patient Position: Sitting, Cuff Size: Normal)    Pulse 90    Ht 5\' 3"  (1.6 m)    Wt 230 lb 6.4 oz (104.5 kg)    SpO2 96%    BMI 40.81 kg/m     Wt Readings from Last 3 Encounters:  06/03/21 230 lb 6.4 oz (104.5 kg)  04/13/21 224 lb 1.9 oz (101.7 kg)  01/19/21 218 lb (98.9 kg)     GEN:  Well nourished, well developed in no acute distress HEENT:  Normal NECK: No JVD; No carotid bruits LYMPHATICS: No lymphadenopathy CARDIAC: RRR, no murmurs, rubs, gallops RESPIRATORY:  Clear to auscultation without rales, wheezing or rhonchi  ABDOMEN: Soft, non-tender, non-distended MUSCULOSKELETAL:  No edema; No deformity  SKIN: Warm and dry NEUROLOGIC:  Alert and oriented x 3 PSYCHIATRIC:  Normal affect    Signed, 01/21/21, MD  06/03/2021 12:46 PM    [facility]

## 2021-06-03 NOTE — Patient Instructions (Signed)
Medication Instructions:  Your physician has recommended you make the following change in your medication:  START: Imdur 30 mg take one tablet by mouth daily.  START: Nitroglycerin 0.4 mg take one tablet by mouth every 5 minutes up to three times as needed for chest pain.  START: TOPROL XL 25 mg take one tablet by mouth daily.  *If you need a refill on your cardiac medications before your next appointment, please call your pharmacy*   Lab Work: Your physician recommends that you return for lab work in: TODAY CMP, Lipids If you have labs (blood work) drawn today and your tests are completely normal, you will receive your results only by: MyChart Message (if you have MyChart) OR A paper copy in the mail If you have any lab test that is abnormal or we need to change your treatment, we will call you to review the results.   Testing/Procedures: None   Follow-Up: At [facility], you and your health needs are our priority.  As part of our continuing mission to provide you with exceptional heart care, we have created designated Provider Care Teams.  These Care Teams include your primary Cardiologist (physician) and Advanced Practice Providers (APPs -  Physician Assistants and Nurse Practitioners) who all work together to provide you with the care you need, when you need it.  We recommend signing up for the patient portal called "MyChart".  Sign up information is provided on this After Visit Summary.  MyChart is used to connect with patients for Virtual Visits (Telemedicine).  Patients are able to view lab/test results, encounter notes, upcoming appointments, etc.  Non-urgent messages can be sent to your provider as well.   To learn more about what you can do with MyChart, go to [email or web address].    Your next appointment:   1 year(s)  The format for your next appointment:   In Person  Provider:   Norman Herrlich, MD    Other Instructions

## 2021-06-04 LAB — LIPID PANEL
Chol/HDL Ratio: 3.5 ratio (ref 0.0–4.4)
Cholesterol, Total: 203 mg/dL — ABNORMAL HIGH (ref 100–199)
HDL: 58 mg/dL (ref >39)
LDL Chol Calc (NIH): 127 mg/dL — ABNORMAL HIGH (ref 0–99)
Triglycerides: 100 mg/dL (ref 0–149)
VLDL Cholesterol Cal: 18 mg/dL (ref 5–40)

## 2021-06-04 LAB — COMPREHENSIVE METABOLIC PANEL
ALT: 22 IU/L (ref 0–32)
AST: 27 IU/L (ref 0–40)
Albumin/Globulin Ratio: 2 (ref 1.2–2.2)
Albumin: 4.3 g/dL (ref 3.8–4.8)
Alkaline Phosphatase: 58 IU/L (ref 44–121)
BUN/Creatinine Ratio: 15 (ref 12–28)
BUN: 12 mg/dL (ref 8–27)
Bilirubin Total: 0.2 mg/dL (ref 0.0–1.2)
CO2: 25 mmol/L (ref 20–29)
Calcium: 9.2 mg/dL (ref 8.7–10.3)
Chloride: 106 mmol/L (ref 96–106)
Creatinine, Ser: 0.82 mg/dL (ref 0.57–1.00)
Globulin, Total: 2.2 g/dL (ref 1.5–4.5)
Glucose: 95 mg/dL (ref 70–99)
Potassium: 4.1 mmol/L (ref 3.5–5.2)
Sodium: 144 mmol/L (ref 134–144)
Total Protein: 6.5 g/dL (ref 6.0–8.5)
eGFR: 79 mL/min/{1.73_m2} (ref >59)

## 2021-06-14 ENCOUNTER — Encounter: Payer: Self-pay | Admitting: Neurology

## 2021-06-14 ENCOUNTER — Ambulatory Visit: Payer: Medicare Other | Admitting: Neurology

## 2021-06-15 ENCOUNTER — Emergency Department (HOSPITAL_BASED_OUTPATIENT_CLINIC_OR_DEPARTMENT_OTHER)
Admission: EM | Admit: 2021-06-15 | Discharge: 2021-06-15 | Disposition: A | Discharge status: Home / Self Care | Payer: 59 | Attending: Emergency Medicine | Admitting: Emergency Medicine

## 2021-06-15 ENCOUNTER — Encounter (HOSPITAL_BASED_OUTPATIENT_CLINIC_OR_DEPARTMENT_OTHER): Payer: Self-pay | Admitting: Urology

## 2021-06-15 ENCOUNTER — Other Ambulatory Visit: Payer: Self-pay

## 2021-06-15 ENCOUNTER — Emergency Department (HOSPITAL_BASED_OUTPATIENT_CLINIC_OR_DEPARTMENT_OTHER): Payer: 59

## 2021-06-15 DIAGNOSIS — R1031 Right lower quadrant pain: Secondary | ICD-10-CM | POA: Insufficient documentation

## 2021-06-15 DIAGNOSIS — E119 Type 2 diabetes mellitus without complications: Secondary | ICD-10-CM | POA: Insufficient documentation

## 2021-06-15 DIAGNOSIS — R112 Nausea with vomiting, unspecified: Secondary | ICD-10-CM | POA: Diagnosis not present

## 2021-06-15 DIAGNOSIS — Z79899 Other long term (current) drug therapy: Secondary | ICD-10-CM | POA: Insufficient documentation

## 2021-06-15 DIAGNOSIS — Z20822 Contact with and (suspected) exposure to covid-19: Secondary | ICD-10-CM | POA: Diagnosis not present

## 2021-06-15 DIAGNOSIS — J449 Chronic obstructive pulmonary disease, unspecified: Secondary | ICD-10-CM | POA: Diagnosis not present

## 2021-06-15 DIAGNOSIS — I10 Essential (primary) hypertension: Secondary | ICD-10-CM | POA: Diagnosis not present

## 2021-06-15 DIAGNOSIS — Z7901 Long term (current) use of anticoagulants: Secondary | ICD-10-CM | POA: Insufficient documentation

## 2021-06-15 LAB — RESP PANEL BY RT-PCR (FLU A&B, COVID) ARPGX2
Influenza A by PCR: NEGATIVE
Influenza B by PCR: NEGATIVE
SARS Coronavirus 2 by RT PCR: NEGATIVE

## 2021-06-15 LAB — CBC
HCT: 37.8 % (ref 36.0–46.0)
Hemoglobin: 12.5 g/dL (ref 12.0–15.0)
MCH: 30.2 pg (ref 26.0–34.0)
MCHC: 33.1 g/dL (ref 30.0–36.0)
MCV: 91.3 fL (ref 80.0–100.0)
Platelets: 261 10*3/uL (ref 150–400)
RBC: 4.14 MIL/uL (ref 3.87–5.11)
RDW: 14.9 % (ref 11.5–15.5)
WBC: 4.3 10*3/uL (ref 4.0–10.5)
nRBC: 0 % (ref 0.0–0.2)

## 2021-06-15 LAB — COMPREHENSIVE METABOLIC PANEL
ALT: 29 U/L (ref 0–44)
AST: 43 U/L — ABNORMAL HIGH (ref 15–41)
Albumin: 3.8 g/dL (ref 3.5–5.0)
Alkaline Phosphatase: 47 U/L (ref 38–126)
Anion gap: 9 (ref 5–15)
BUN: 17 mg/dL (ref 8–23)
CO2: 24 mmol/L (ref 22–32)
Calcium: 9.2 mg/dL (ref 8.9–10.3)
Chloride: 103 mmol/L (ref 98–111)
Creatinine, Ser: 0.99 mg/dL (ref 0.44–1.00)
GFR, Estimated: >60 mL/min (ref >60)
Glucose, Bld: 100 mg/dL — ABNORMAL HIGH (ref 70–99)
Potassium: 3.4 mmol/L — ABNORMAL LOW (ref 3.5–5.1)
Sodium: 136 mmol/L (ref 135–145)
Total Bilirubin: 0.5 mg/dL (ref 0.3–1.2)
Total Protein: 7.4 g/dL (ref 6.5–8.1)

## 2021-06-15 LAB — LIPASE, BLOOD: Lipase: 34 U/L (ref 11–51)

## 2021-06-15 MED ORDER — MORPHINE SULFATE (PF) 4 MG/ML IV SOLN
4.0000 mg | Freq: Once | INTRAVENOUS | Status: AC
  Administered 2021-06-15: 4 mg via INTRAVENOUS
  Filled 2021-06-15: qty 1

## 2021-06-15 MED ORDER — METOCLOPRAMIDE HCL 5 MG/ML IJ SOLN
10.0000 mg | Freq: Once | INTRAMUSCULAR | Status: AC
Start: 2021-06-15 — End: 2021-06-15
  Administered 2021-06-15: 10 mg via INTRAVENOUS
  Filled 2021-06-15: qty 2

## 2021-06-15 MED ORDER — ACETAMINOPHEN 325 MG PO TABS
650.0000 mg | ORAL_TABLET | Freq: Once | ORAL | Status: AC
  Administered 2021-06-15: 650 mg via ORAL
  Filled 2021-06-15: qty 2

## 2021-06-15 MED ORDER — DIPHENHYDRAMINE HCL 25 MG PO TABS: 25.0000 mg | ORAL_TABLET | Freq: Four times a day (QID) | ORAL | 0 refills | Status: DC | PRN

## 2021-06-15 MED ORDER — IOHEXOL 300 MG/ML  SOLN
100.0000 mL | Freq: Once | INTRAMUSCULAR | Status: AC | PRN
  Administered 2021-06-15: 100 mL via INTRAVENOUS

## 2021-06-15 MED ORDER — METOCLOPRAMIDE HCL 10 MG PO TABS: 10.0000 mg | ORAL_TABLET | Freq: Four times a day (QID) | ORAL | 0 refills | Status: DC

## 2021-06-15 MED ORDER — DIPHENHYDRAMINE HCL 25 MG PO CAPS
25.0000 mg | ORAL_CAPSULE | Freq: Once | ORAL | Status: AC
  Administered 2021-06-15: 25 mg via ORAL
  Filled 2021-06-15: qty 1

## 2021-06-15 MED ORDER — DICYCLOMINE HCL 20 MG PO TABS: 20.0000 mg | ORAL_TABLET | Freq: Two times a day (BID) | ORAL | 0 refills | Status: DC

## 2021-06-15 NOTE — ED Provider Notes (Signed)
[facility] Provider Note   CSN: [id number] Arrival date & time: 06/15/21  1214     History  Chief Complaint  Patient presents with   Abdominal Pain    [name] is a 66 y.o. female.   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  She states that it is constant achy she states it does not seem to be worse when she is walking or going over bumps in the road.  She states she has had 3-4 episodes of vomiting yesterday and one episode of vomiting this morning states that she feels somewhat nauseous denies any urinary frequency urgency dysuria or hematuria.  States she did have these symptoms a week ago and was treated for UTI states that her symptoms completely resolved.  She denies any fevers.    Home Medications Prior to Admission medications   Medication Sig Start Date End Date Taking? Authorizing Provider  dicyclomine (BENTYL) 20 MG tablet Take 1 tablet (20 mg total) by mouth 2 (two) times daily. 06/15/21  Yes Naliah Eddington S, PA  diphenhydrAMINE (BENADRYL) 25 MG tablet Take 1 tablet (25 mg total) by mouth every 6 (six) hours as needed. 06/15/21  Yes Jeyren Danowski S, PA  metoCLOPramide (REGLAN) 10 MG tablet Take 1 tablet (10 mg total) by mouth every 6 (six) hours. 06/15/21  Yes Romero Letizia S, PA  albuterol (VENTOLIN HFA) 108 (90 Base) MCG/ACT inhaler 2 puffs every 4 (four) hours as needed for wheezing or shortness of breath.    [provider]  amLODipine (NORVASC) 10 MG tablet amlodipine 10 mg tablet    [provider]  celecoxib (CELEBREX) 400 MG capsule Take 400 mg by mouth 2 (two) times daily. 03/31/21   [provider]  cyclobenzaprine (FLEXERIL) 10 MG tablet Take 5-10 mg by mouth 3 (three) times daily as needed for muscle spasms. 03/29/21    [provider]  diclofenac (VOLTAREN) 50 MG EC tablet Take 50 mg by mouth 2 (two) times daily as needed for pain. 03/23/21   [provider]  diclofenac Sodium (VOLTAREN) 1 % GEL Apply 1 application topically 4 (four) times daily. 04/07/21   [provider]  DULoxetine (CYMBALTA) 60 MG capsule Take 120 mg by mouth daily. 03/23/21   [provider]  esomeprazole (NEXIUM) 40 MG capsule esomeprazole magnesium 40 mg capsule,delayed release  TAKE 1 CAPSULE BY MOUTH EVERY DAY 04/07/14   [provider]  furosemide (LASIX) 40 MG tablet 40 mg daily as needed for fluid or edema. 11/19/16   [provider]  hydrALAZINE (APRESOLINE) 25 MG tablet Take 25 mg by mouth daily.    [provider]  Ipratropium-Albuterol (COMBIVENT RESPIMAT) 20-100 MCG/ACT AERS respimat 1 puff every 6 (six) hours as needed for wheezing or shortness of breath. 04/17/14   [provider]  isosorbide mononitrate (IMDUR) 30 MG 24 hr tablet Take 1 tablet (30 mg total) by mouth daily. 06/03/21 09/01/21  Richardo Priest, MD  losartan (COZAAR) 100 MG tablet Take 100 mg by mouth daily. 04/03/21   [provider]  meclizine (ANTIVERT) 25 MG tablet Take 25 mg by mouth 3 (three) times daily as needed for dizziness. 10/16/20   [provider]  metoprolol succinate (TOPROL XL) 25 MG 24 hr tablet Take 1 tablet (  25 mg total) by mouth daily. 06/03/21   Baldo Daub, MD  metoprolol tartrate (LOPRESSOR) 100 MG tablet Take 1 tablet (100 mg total) by mouth once for 1 dose. Take two hours prior to your cardiac CT 04/13/21 04/13/21  Baldo Daub, MD  nitroGLYCERIN (NITROSTAT) 0.4 MG SL tablet Place 1 tablet (0.4 mg total) under the tongue every 5 (five) minutes as needed. 06/03/21 09/01/21  Baldo Daub, MD  nortriptyline (PAMELOR) 50 MG capsule nortriptyline 50 mg capsule    [provider]  ondansetron (ZOFRAN) 4 MG tablet Take 4 mg by mouth every 8 (eight)  hours as needed for nausea.    [provider]  potassium chloride (KLOR-CON M) 10 MEQ tablet 10 mEq daily as needed (When taking lasix for edema).    [provider]  rivaroxaban (XARELTO) 20 MG TABS tablet 20 mg daily with supper.    [provider]  rosuvastatin (CRESTOR) 20 MG tablet Take 20 mg by mouth daily. 08/09/10   [provider]  Tiotropium Bromide-Olodaterol (STIOLTO RESPIMAT) 2.5-2.5 MCG/ACT AERS INHALE 2 PUFFS BY MOUTH INTO THE LUNGS DAILY 04/05/21   Oretha Milch, MD  VICTOZA 18 MG/3ML SOPN Inject 1.8 mg into the skin daily. 11/15/19   [provider]  Vitamin D, Ergocalciferol, (DRISDOL) 1.25 MG (50000 UNIT) CAPS capsule Take 50,000 Units by mouth once a week. 03/23/21   [provider]      Allergies    Codeine, Codeine phosphate, and Tramadol-acetaminophen    Review of Systems   Review of Systems  Gastrointestinal:  Positive for abdominal pain.   Physical Exam Updated Vital Signs BP 120/67    Pulse 72    Temp 99.3 F (37.4 C) (Oral)    Resp 18    Ht 5\' 3"  (1.6 m)    Wt 101.2 kg    SpO2 97%    BMI 39.50 kg/m  Physical Exam Vitals and nursing note reviewed.  Constitutional:      General: She is not in acute distress. HENT:     Head: Normocephalic and atraumatic.     Nose: Nose normal.  Eyes:     General: No scleral icterus. Cardiovascular:     Rate and Rhythm: Normal rate and regular rhythm.     Pulses: Normal pulses.     Heart sounds: Normal heart sounds.  Pulmonary:     Effort: Pulmonary effort is normal. No respiratory distress.     Breath sounds: No wheezing.  Abdominal:     Palpations: Abdomen is soft.     Tenderness: There is abdominal tenderness in the right lower quadrant.  Musculoskeletal:     Cervical back: Normal range of motion.     Right lower leg: No edema.     Left lower leg: No edema.  Skin:    General: Skin is warm and dry.     Capillary Refill: Capillary refill takes less than 2  seconds.  Neurological:     Mental Status: She is alert. Mental status is at baseline.  Psychiatric:        Mood and Affect: Mood normal.        Behavior: Behavior normal.    ED Results / Procedures / Treatments   Labs (all labs ordered are listed, but only abnormal results are displayed) Labs Reviewed  COMPREHENSIVE METABOLIC PANEL - Abnormal; Notable for the following components:      Result Value   Potassium 3.4 (*)    Glucose,  Bld 100 (*)    AST 43 (*)    All other components within normal limits  RESP PANEL BY RT-PCR (FLU A&B, COVID) ARPGX2  LIPASE, BLOOD  CBC  URINALYSIS, ROUTINE W REFLEX MICROSCOPIC    EKG None  Radiology CT Abdomen Pelvis W Contrast  Result Date: 06/15/2021 CLINICAL DATA:  Right lower quadrant pain for several days. Nausea and vomiting. Diarrhea. EXAM: CT ABDOMEN AND PELVIS WITH CONTRAST TECHNIQUE: Multidetector CT imaging of the abdomen and pelvis was performed using the standard protocol following bolus administration of intravenous contrast. RADIATION DOSE REDUCTION: This exam was performed according to the departmental dose-optimization program which includes automated exposure control, adjustment of the mA and/or kV according to patient size and/or use of iterative reconstruction technique. CONTRAST:  173mL OMNIPAQUE IOHEXOL 300 MG/ML  SOLN COMPARISON:  08/17/2020 FINDINGS: Lower Chest: No acute findings. Hepatobiliary: No hepatic masses identified. Multiple tiny gallstones are seen along the dependent gallbladder wall, however there is no evidence of cholecystitis or biliary ductal dilatation. Pancreas:  No mass or inflammatory changes. Spleen: Within normal limits in size and appearance. Adrenals/Urinary Tract: No masses identified. Mild scarring again seen involving both kidneys. No evidence of ureteral calculi or hydronephrosis. Stomach/Bowel: No evidence of obstruction, inflammatory process or abnormal fluid collections. Normal appendix visualized.  Vascular/Lymphatic: No pathologically enlarged lymph nodes. No acute vascular findings. Aortic atherosclerotic calcification noted. Reproductive: Prior hysterectomy noted. Adnexal regions are unremarkable in appearance. Other:  Stable small umbilical hernia, which contains only fat. Musculoskeletal: No suspicious bone lesions identified. Stable lumbar spine degenerative changes, with grade 1-2 anterolisthesis at L3-4 measuring approximately 8 mm. IMPRESSION: No evidence of appendicitis or other acute findings. Cholelithiasis. No radiographic evidence of cholecystitis. Aortic Atherosclerosis (ICD10-I70.0). Electronically Signed   By: Marlaine Hind M.D.   On: 06/15/2021 14:44    Procedures Procedures    Medications Ordered in ED Medications  morphine (PF) 4 MG/ML injection 4 mg (4 mg Intravenous Given 06/15/21 1323)  metoCLOPramide (REGLAN) injection 10 mg (10 mg Intravenous Given 06/15/21 1320)  acetaminophen (TYLENOL) tablet 650 mg (650 mg Oral Given 06/15/21 1317)  diphenhydrAMINE (BENADRYL) capsule 25 mg (25 mg Oral Given 06/15/21 1317)  iohexol (OMNIPAQUE) 300 MG/ML solution 100 mL (100 mLs Intravenous Contrast Given 06/15/21 1415)    ED Course/ Medical Decision Making/ A&P                           Medical Decision Making Amount and/or Complexity of Data Reviewed Labs: ordered. Radiology: ordered.  Risk OTC drugs. Prescription drug management.    This patient presents to the ED for concern of lower abdominal pain, this involves a number of treatment options, and is a complaint that carries with it a moderate to high risk of complications and morbidity.  The differential diagnosis includes The causes of generalized abdominal pain include but are not limited to AAA, mesenteric ischemia, appendicitis, diverticulitis, DKA, gastritis, gastroenteritis, AMI, nephrolithiasis, pancreatitis, peritonitis, adrenal insufficiency,lead poisoning, iron toxicity, intestinal ischemia, constipation, UTI,SBO/LBO,  splenic rupture, biliary disease, IBD, IBS, PUD, or hepatitis. Ectopic pregnancy, ovarian torsion, PID.    Co morbidities: Discussed in HPI   Brief History:  Right-sided lower abdominal pain for 3 days she states that she developed nausea and vomiting yesterday vomited 3-4 times and was seen by her primary care office this morning and sent to the emergency room for further evaluation.  She states she has a bit of a headache as well as some  nausea.  Physical exam is notable for some tenderness of the right lower abdomen.  Not guarding and there is no rebound.  Questionable possible Rovsing sign.  I do have some concern for appendicitis will obtain CT of the pelvis with contrast.  Patient provided Reglan Benadryl Tylenol and 1 dose of morphine  Reassessed patient after these medications and she feels much improved states her pain is 0/10 which is significant improvement from 10/10 when she first arrived.  Denies any nausea   EMR reviewed including pt PMHx, past surgical history and past visits to ER.   See HPI for more details   Lab Tests:  I ordered and independently interpreted labs.  The pertinent results include:    I personally reviewed all laboratory work and imaging. Metabolic panel without any acute abnormality specifically kidney function within normal limits and no significant electrolyte abnormalities. CBC without leukocytosis or significant anemia.  Mild hypokalemia recommend bananas  Patient feels improved at this time.  P.o. challenged successfully able to keep down fluids.  Is requesting discharge home at this time.  Will have urinalysis done with PCP  Imaging Studies:  NAD. I personally reviewed all imaging studies and no acute abnormality found. I agree with radiology interpretation. CT abdomen pelvis with contrast without abnormality.  Specifically no appendicitis or stranding around bladder.   Cardiac Monitoring:  NA NA   Medicines ordered:  I ordered  medication including Reglan, Benadryl, morphine, Tylenol for pain nausea.   Reevaluation of the patient after these medicines showed that the patient resolved I have reviewed the patients home medicines and have made adjustments as needed   Critical Interventions:     Consults:      Reevaluation:  After the interventions noted above I re-evaluated patient and found that they have :resolved   Social Determinants of Health:  The patient's social determinants of health were a factor in the care of this patient    Problem List / ED Course:  Abdominal pain Nausea/vomiting   Patient reassessed after imaging was completed.  Her symptoms have completely abated on repeat abdominal exam she still somewhat tender in right lower quadrant but not significantly so.  States that she feels much improved is tolerating p.o. well to be discharged home at this time.  Recommended close follow-up with PCP.  Return precautions were given.   Dispostion:  After consideration of the diagnostic results and the patients response to treatment, I feel that the patent would benefit from close follow-up with PCP/gastroenterology.  She states she has a gastroenterologist who she will follow-up with.      Final Clinical Impression(s) / ED Diagnoses Final diagnoses:  Right lower quadrant abdominal pain    Rx / DC Orders ED Discharge Orders          Ordered    metoCLOPramide (REGLAN) 10 MG tablet  Every 6 hours        06/15/21 1508    diphenhydrAMINE (BENADRYL) 25 MG tablet  Every 6 hours PRN        06/15/21 1508    dicyclomine (BENTYL) 20 MG tablet  2 times daily        06/15/21 1508              Tedd Sias, [place] [id number]    Lianne Cure, DO [id number]

## 2021-06-15 NOTE — ED Notes (Signed)
Pt A&Ox4 ambulatory at d/c with independent steady gait, NAD. Pt verbalized understanding of d/c instructions, prescriptions and follow up care. ?

## 2021-06-15 NOTE — ED Triage Notes (Signed)
RLQ pain x 3 days Vomiting and chills since yesterday  Sent by PCP for further eval

## 2021-06-15 NOTE — ED Notes (Signed)
Pt reports she will be having someone drive her home.

## 2021-06-15 NOTE — Discharge Instructions (Signed)
The CT scan of your abdomen pelvis was without any evidence of infection, appendicitis, diverticulitis or other abnormality.  I am pleased that we did not find 1 of these diseases, however that does leave the question of what is causing her symptoms.  I would like for you to follow-up with your gastroenterologist.  Please call today to make an appointment within the next week or 2.  I would also like you to follow-up with your primary care doctor.  Please return to the ER for any new or concerning symptoms.  Please take Reglan as needed for nausea with a 25 mg dose of Benadryl.  Drink plenty of water.  Use the Bentyl I prescribed you for pain as needed you may also take Tylenol 1000 mg every 6 hours.

## 2021-06-23 ENCOUNTER — Other Ambulatory Visit: Payer: Self-pay | Admitting: Pulmonary Disease

## 2021-08-18 ENCOUNTER — Telehealth: Payer: Self-pay | Admitting: Cardiology

## 2021-08-18 NOTE — Telephone Encounter (Signed)
?*  STAT* If patient is at the pharmacy, call can be transferred to refill team. ? ? ?1. Which medications need to be refilled? (please list name of each medication and dose if known)  ? isosorbide mononitrate (IMDUR) 30 MG 24 hr tablet  ? ? metoprolol succinate (TOPROL XL) 25 MG 24 hr tablet  ? ? ?2. Which pharmacy/location (including street and city if local pharmacy) is medication to be sent to? [id number] - [place], [place] - [address], [address] AT [facility] [address] ? ?3. Do they need a 30 day or 90 day supply? 90 day ? ?

## 2021-08-19 MED ORDER — METOPROLOL SUCCINATE ER 25 MG PO TB24: 25.0000 mg | ORAL_TABLET | Freq: Every day | ORAL | 2 refills | Status: DC

## 2021-08-19 MED ORDER — ISOSORBIDE MONONITRATE ER 30 MG PO TB24: 30.0000 mg | ORAL_TABLET | Freq: Every day | ORAL | 2 refills | Status: DC

## 2021-08-19 NOTE — Telephone Encounter (Signed)
Refill of Imdur 30 mg and Metoprolol Succinate 25 mg sent to [facility]. ?

## 2021-08-25 ENCOUNTER — Emergency Department (HOSPITAL_BASED_OUTPATIENT_CLINIC_OR_DEPARTMENT_OTHER): Payer: 59

## 2021-08-25 ENCOUNTER — Emergency Department (HOSPITAL_BASED_OUTPATIENT_CLINIC_OR_DEPARTMENT_OTHER)
Admission: EM | Admit: 2021-08-25 | Discharge: 2021-08-25 | Disposition: A | Discharge status: Home / Self Care | Payer: 59 | Attending: Emergency Medicine | Admitting: Emergency Medicine

## 2021-08-25 ENCOUNTER — Encounter (HOSPITAL_BASED_OUTPATIENT_CLINIC_OR_DEPARTMENT_OTHER): Payer: Self-pay

## 2021-08-25 ENCOUNTER — Other Ambulatory Visit: Payer: Self-pay

## 2021-08-25 DIAGNOSIS — D649 Anemia, unspecified: Secondary | ICD-10-CM | POA: Insufficient documentation

## 2021-08-25 DIAGNOSIS — R079 Chest pain, unspecified: Secondary | ICD-10-CM | POA: Diagnosis not present

## 2021-08-25 DIAGNOSIS — J449 Chronic obstructive pulmonary disease, unspecified: Secondary | ICD-10-CM | POA: Diagnosis not present

## 2021-08-25 DIAGNOSIS — E876 Hypokalemia: Secondary | ICD-10-CM | POA: Insufficient documentation

## 2021-08-25 DIAGNOSIS — Z7951 Long term (current) use of inhaled steroids: Secondary | ICD-10-CM | POA: Diagnosis not present

## 2021-08-25 DIAGNOSIS — R0602 Shortness of breath: Secondary | ICD-10-CM | POA: Diagnosis present

## 2021-08-25 DIAGNOSIS — E119 Type 2 diabetes mellitus without complications: Secondary | ICD-10-CM | POA: Insufficient documentation

## 2021-08-25 DIAGNOSIS — Z7901 Long term (current) use of anticoagulants: Secondary | ICD-10-CM | POA: Diagnosis not present

## 2021-08-25 DIAGNOSIS — R600 Localized edema: Secondary | ICD-10-CM | POA: Diagnosis not present

## 2021-08-25 DIAGNOSIS — Z79899 Other long term (current) drug therapy: Secondary | ICD-10-CM | POA: Insufficient documentation

## 2021-08-25 DIAGNOSIS — F17200 Nicotine dependence, unspecified, uncomplicated: Secondary | ICD-10-CM | POA: Insufficient documentation

## 2021-08-25 LAB — CBC
HCT: 33.6 % — ABNORMAL LOW (ref 36.0–46.0)
Hemoglobin: 11.2 g/dL — ABNORMAL LOW (ref 12.0–15.0)
MCH: 30.1 pg (ref 26.0–34.0)
MCHC: 33.3 g/dL (ref 30.0–36.0)
MCV: 90.3 fL (ref 80.0–100.0)
Platelets: 233 10*3/uL (ref 150–400)
RBC: 3.72 MIL/uL — ABNORMAL LOW (ref 3.87–5.11)
RDW: 13.7 % (ref 11.5–15.5)
WBC: 4 10*3/uL (ref 4.0–10.5)
nRBC: 0 % (ref 0.0–0.2)

## 2021-08-25 LAB — COMPREHENSIVE METABOLIC PANEL
ALT: 18 U/L (ref 0–44)
AST: 26 U/L (ref 15–41)
Albumin: 3.6 g/dL (ref 3.5–5.0)
Alkaline Phosphatase: 43 U/L (ref 38–126)
Anion gap: 9 (ref 5–15)
BUN: 13 mg/dL (ref 8–23)
CO2: 25 mmol/L (ref 22–32)
Calcium: 8.9 mg/dL (ref 8.9–10.3)
Chloride: 104 mmol/L (ref 98–111)
Creatinine, Ser: 1.05 mg/dL — ABNORMAL HIGH (ref 0.44–1.00)
GFR, Estimated: 59 mL/min — ABNORMAL LOW (ref >60)
Glucose, Bld: 127 mg/dL — ABNORMAL HIGH (ref 70–99)
Potassium: 3.1 mmol/L — ABNORMAL LOW (ref 3.5–5.1)
Sodium: 138 mmol/L (ref 135–145)
Total Bilirubin: 0.6 mg/dL (ref 0.3–1.2)
Total Protein: 7 g/dL (ref 6.5–8.1)

## 2021-08-25 LAB — TROPONIN I (HIGH SENSITIVITY)
Troponin I (High Sensitivity): 6 ng/L (ref <18)
Troponin I (High Sensitivity): 6 ng/L (ref <18)

## 2021-08-25 MED ORDER — IOHEXOL 350 MG/ML SOLN
100.0000 mL | Freq: Once | INTRAVENOUS | Status: DC | PRN
Start: 2021-08-25 — End: 2021-08-25

## 2021-08-25 MED ORDER — PREDNISONE 20 MG PO TABS: 40.0000 mg | ORAL_TABLET | Freq: Every day | ORAL | 0 refills | Status: DC

## 2021-08-25 MED ORDER — IOHEXOL 350 MG/ML SOLN
75.0000 mL | Freq: Once | INTRAVENOUS | Status: AC | PRN
  Administered 2021-08-25: 75 mL via INTRAVENOUS

## 2021-08-25 NOTE — ED Provider Notes (Signed)
?[facility] [place] EMERGENCY DEPARTMENT ?Provider Note ? ? ?CSN: [id number] ?Arrival date & time: 08/25/21  1215 ? ?  ? ?History ? ?Chief Complaint  ?Patient presents with  ? Shortness of Breath  ? ? ?[name] is a 66 y.o. female. ? 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  Patient states that she missed approximately 5 days of her anticoagulation.  She has been taking it for the past 4 days.  2 to 3 days ago she developed worsening shortness of breath.  She states that this is worse with activity.  In addition, she has aching in her bilateral arms.  No worsening lower extremity swelling or unilateral leg pain.  She denies fever or cough.  She has wheezing sometimes but not persistently.  No other URI symptoms.  No central chest pain.  No associated vomiting or diaphoresis.  States that she was told by her primary care provider to come to the emergency department today. ? ? ?  ? ?Home Medications ?Prior to Admission medications   ?Medication Sig Start Date End Date Taking? Authorizing Provider  ?albuterol (VENTOLIN HFA) 108 (90 Base) MCG/ACT inhaler 2 puffs every 4 (four) hours as needed for wheezing or shortness of breath.    [provider]  ?amLODipine (NORVASC) 10 MG tablet amlodipine 10 mg tablet    [provider]  ?celecoxib (CELEBREX) 400 MG capsule Take 400 mg by mouth 2 (two) times daily. 03/31/21   [provider]  ?cyclobenzaprine (FLEXERIL) 10 MG tablet Take 5-10 mg by mouth 3 (three) times daily as needed for muscle spasms. 03/29/21   [provider]  ?diclofenac (VOLTAREN) 50 MG EC tablet Take 50 mg by mouth 2 (two) times daily as needed for pain. 03/23/21   [provider]   ?diclofenac Sodium (VOLTAREN) 1 % GEL Apply 1 application topically 4 (four) times daily. 04/07/21   [provider]  ?dicyclomine (BENTYL) 20 MG tablet Take 1 tablet (20 mg total) by mouth 2 (two) times daily. 06/15/21   Gailen Shelter, PA  ?diphenhydrAMINE (BENADRYL) 25 MG tablet Take 1 tablet (25 mg total) by mouth every 6 (six) hours as needed. 06/15/21   Gailen Shelter, PA  ?DULoxetine (CYMBALTA) 60 MG capsule Take 120 mg by mouth daily. 03/23/21   [provider]  ?esomeprazole (NEXIUM) 40 MG capsule esomeprazole magnesium 40 mg capsule,delayed release ? TAKE 1 CAPSULE BY MOUTH EVERY DAY 04/07/14   [provider]  ?furosemide (LASIX) 40 MG tablet 40 mg daily as needed for fluid or edema. 11/19/16   [provider]  ?hydrALAZINE (APRESOLINE) 25 MG tablet Take 25 mg by mouth daily.    [provider]  ?Ipratropium-Albuterol (COMBIVENT RESPIMAT) 20-100 MCG/ACT AERS respimat 1 puff every 6 (six) hours as needed for wheezing or shortness of breath. 04/17/14   [provider]  ?isosorbide mononitrate (IMDUR) 30 MG 24 hr tablet Take 1 tablet (30 mg total) by mouth daily. 08/19/21 05/16/22  Baldo Daub, MD  ?losartan (COZAAR) 100 MG tablet Take 100 mg by mouth daily. 04/03/21   [provider]  ?meclizine (ANTIVERT) 25 MG tablet Take 25 mg by mouth 3 (three) times daily as needed for dizziness. 10/16/20   [provider]  ?metoCLOPramide (REGLAN) 10  MG tablet Take 1 tablet (10 mg total) by mouth every 6 (six) hours. 06/15/21   Gailen ShelterFondaw, Wylder S, PA  ?metoprolol succinate (TOPROL XL) 25 MG 24 hr tablet Take 1 tablet (25 mg total) by mouth daily. 08/19/21   Baldo DaubMunley, Brian J, MD  ?metoprolol tartrate (LOPRESSOR) 100 MG tablet Take 1 tablet (100 mg total) by mouth once for 1 dose. Take two hours prior to your cardiac CT 04/13/21 04/13/21  Baldo DaubMunley, Brian J, MD  ?nitroGLYCERIN (NITROSTAT) 0.4 MG SL tablet Place 1 tablet (0.4 mg total) under the tongue every 5  (five) minutes as needed. 06/03/21 09/01/21  Baldo DaubMunley, Brian J, MD  ?nortriptyline (PAMELOR) 50 MG capsule nortriptyline 50 mg capsule    [provider]  ?ondansetron (ZOFRAN) 4 MG tablet Take 4 mg by mouth every 8 (eight) hours as needed for nausea.    [provider]  ?potassium chloride (KLOR-CON M) 10 MEQ tablet 10 mEq daily as needed (When taking lasix for edema).    [provider]  ?rivaroxaban (XARELTO) 20 MG TABS tablet 20 mg daily with supper.    [provider]  ?rosuvastatin (CRESTOR) 20 MG tablet Take 20 mg by mouth daily. 08/09/10   [provider]  ?Tiotropium Bromide-Olodaterol (STIOLTO RESPIMAT) 2.5-2.5 MCG/ACT AERS INHALE 2 PUFFS BY MOUTH INTO THE LUNGS DAILY 04/05/21   Oretha MilchAlva, Rakesh V, MD  ?VICTOZA 18 MG/3ML SOPN Inject 1.8 mg into the skin daily. 11/15/19   [provider]  ?Vitamin D, Ergocalciferol, (DRISDOL) 1.25 MG (50000 UNIT) CAPS capsule Take 50,000 Units by mouth once a week. 03/23/21   [provider]  ?   ? ?Allergies    ?Codeine, Codeine phosphate, and Tramadol-acetaminophen   ? ?Review of Systems   ?Review of Systems ? ?Physical Exam ?Updated Vital Signs ?BP 113/65   Pulse 71   Temp 98.4 ?F (36.9 ?C) (Oral)   Resp 16   Ht 5\' 4"  (1.626 m)   Wt 97.1 kg   SpO2 99%   BMI 36.73 kg/m?  ? ?Physical Exam ?Vitals and nursing note reviewed.  ?Constitutional:   ?   Appearance: She is well-developed. She is not diaphoretic.  ?HENT:  ?   Head: Normocephalic and atraumatic.  ?   Mouth/Throat:  ?   Mouth: Mucous membranes are not dry.  ?Eyes:  ?   Conjunctiva/sclera: Conjunctivae normal.  ?Neck:  ?   Vascular: Normal carotid pulses. No JVD.  ?   Trachea: Trachea normal. No tracheal deviation.  ?Cardiovascular:  ?   Rate and Rhythm: Normal rate and regular rhythm.  ?   Pulses: No decreased pulses.     ?     Radial pulses are 2+ on the right side and 2+ on the left side.  ?   Heart sounds: Normal heart sounds, S1 normal and S2 normal. No  murmur heard. ?Pulmonary:  ?   Effort: Pulmonary effort is normal. No respiratory distress.  ?   Breath sounds: No wheezing, rhonchi or rales.  ?   Comments: Lungs clear to auscultation bilaterally time of exam without tachypnea or respiratory distress. ?Chest:  ?   Chest wall: No tenderness.  ?Abdominal:  ?   General: Bowel sounds are normal.  ?   Palpations: Abdomen is soft.  ?   Tenderness: There is no abdominal tenderness. There is no guarding or rebound.  ?Musculoskeletal:     ?   General: Normal range of motion.  ?   Cervical back: Normal range  of motion and neck supple. No muscular tenderness.  ?   Right lower leg: No tenderness. Edema (Trace) present.  ?   Left lower leg: No tenderness. Edema (Trace) present.  ?Skin: ?   General: Skin is warm and dry.  ?   Coloration: Skin is not pale.  ?Neurological:  ?   Mental Status: She is alert.  ? ? ?ED Results / Procedures / Treatments   ?Labs ?(all labs ordered are listed, but only abnormal results are displayed) ?Labs Reviewed  ?CBC - Abnormal; Notable for the following components:  ?    Result Value  ? RBC 3.72 (*)   ? Hemoglobin 11.2 (*)   ? HCT 33.6 (*)   ? All other components within normal limits  ?COMPREHENSIVE METABOLIC PANEL - Abnormal; Notable for the following components:  ? Potassium 3.1 (*)   ? Glucose, Bld 127 (*)   ? Creatinine, Ser 1.05 (*)   ? GFR, Estimated 59 (*)   ? All other components within normal limits  ?TROPONIN I (HIGH SENSITIVITY)  ?TROPONIN I (HIGH SENSITIVITY)  ? ? ?EKG ?EKG Interpretation ? ?Date/Time:  Wednesday August 25 2021 12:28:41 EDT ?Ventricular Rate:  70 ?PR Interval:  130 ?QRS Duration: 114 ?QT Interval:  416 ?QTC Calculation: 449 ?R Axis:   -66 ?Text Interpretation: Normal sinus rhythm Left anterior fascicular block Left ventricular hypertrophy ( R in aVL , Romhilt-Estes ) Cannot rule out Anteroseptal infarct , age undetermined Abnormal ECG When compared with ECG of 29-Aug-2007 10:47, PREVIOUS ECG IS PRESENT Confirmed by  Alona Bene [phone]) on 08/25/2021 12:35:38 PM ? ?Radiology ?DG Chest 2 View ? ?Result Date: 08/25/2021 ?CLINICAL DATA:  Shortness of breath and dizziness. EXAM: CHEST - 2 VIEW COMPARISON:  Chest radiographs 07/23/2007

## 2021-08-25 NOTE — ED Triage Notes (Signed)
Pt sent by pmd today, reports shortness of breath and dizzy spells past two days, worse yesterday and today.  PMH: pulmonary embolism, asthma, DM, HTN, Vertigo.  Ambulatory to triage room with steady gait.  Denies recent fevers or other illness. ?

## 2021-08-25 NOTE — ED Notes (Signed)
Patient ambulated on RA per order. O2 remained 94-98% and HR 65-75 during ambulation. Patient endorsed some dizziness with standing prior to ambulation and shortness of breath with ambulation. Patient returned to room and vitals reassessed (in flowsheet). Patient feels breathing has improved at rest.  ?

## 2021-08-25 NOTE — ED Notes (Signed)
Patient transported to CT 

## 2021-08-25 NOTE — Discharge Instructions (Addendum)
Please read and follow all provided instructions. ? ?Your diagnoses today include:  ?1. Shortness of breath   ? ? ?Tests performed today include: ?An EKG of your heart: no signs of heart problem or heart attack ?A chest x-ray: no sign of pneumonia or other problems ?Cardiac enzymes - a blood test for heart muscle damage, were both normal ?Blood counts and electrolytes ?CT scan of the chest: Did not show blood clot or other problems today ?Vital signs. See below for your results today.  ? ?Medications prescribed:  ?Prednisone - steroid medicine  ? ?It is best to take this medication in the morning to prevent sleeping problems. If you are diabetic, monitor your blood sugar closely and stop taking Prednisone if blood sugar is over 300. Take with food to prevent stomach upset.  ? ?Take any prescribed medications only as directed. ? ?Follow-up instructions: ?Please follow-up with your primary care provider as soon as you can for further evaluation of your symptoms.  ? ?Return instructions:  ?SEEK IMMEDIATE MEDICAL ATTENTION IF: ?You have severe chest pain, especially if the pain is crushing or pressure-like and spreads to the arms, back, neck, or jaw, or if you have sweating, nausea or vomiting, or trouble with breathing. THIS IS AN EMERGENCY. Do not wait to see if the pain will go away. Get medical help at once. Call 911. DO NOT drive yourself to the hospital.  ?Your chest pain gets worse and does not go away after a few minutes of rest.  ?You have an attack of chest pain lasting longer than what you usually experience.  ?You have significant dizziness, if you pass out, or have trouble walking.  ?You have chest pain not typical of your usual pain for which you originally saw your caregiver.  ?You have any other emergent concerns regarding your health. ? ?Additional Information: ?Chest pain comes from many different causes. Your caregiver has diagnosed you as having chest pain that is not specific for one problem, but  does not require admission.  You are at low risk for an acute heart condition or other serious illness.  ? ?Your vital signs today were: ?BP 113/65   Pulse 71   Temp 98.4 ?F (36.9 ?C) (Oral)   Resp 16   Ht 5\' 4"  (1.626 m)   Wt 97.1 kg   SpO2 99%   BMI 36.73 kg/m?  ?If your blood pressure (BP) was elevated above 135/85 this visit, please have this repeated by your doctor within one month. ?-------------- ? ? ?

## 2021-10-14 ENCOUNTER — Other Ambulatory Visit: Payer: Self-pay

## 2021-10-14 ENCOUNTER — Emergency Department (HOSPITAL_BASED_OUTPATIENT_CLINIC_OR_DEPARTMENT_OTHER): Payer: 59

## 2021-10-14 ENCOUNTER — Encounter (HOSPITAL_BASED_OUTPATIENT_CLINIC_OR_DEPARTMENT_OTHER): Payer: Self-pay

## 2021-10-14 ENCOUNTER — Emergency Department (HOSPITAL_BASED_OUTPATIENT_CLINIC_OR_DEPARTMENT_OTHER)
Admission: EM | Admit: 2021-10-14 | Discharge: 2021-10-14 | Disposition: A | Discharge status: Home / Self Care | Payer: 59 | Attending: Emergency Medicine | Admitting: Emergency Medicine

## 2021-10-14 DIAGNOSIS — Z7984 Long term (current) use of oral hypoglycemic drugs: Secondary | ICD-10-CM | POA: Diagnosis not present

## 2021-10-14 DIAGNOSIS — R0789 Other chest pain: Secondary | ICD-10-CM | POA: Diagnosis not present

## 2021-10-14 DIAGNOSIS — L299 Pruritus, unspecified: Secondary | ICD-10-CM | POA: Insufficient documentation

## 2021-10-14 DIAGNOSIS — J449 Chronic obstructive pulmonary disease, unspecified: Secondary | ICD-10-CM | POA: Diagnosis not present

## 2021-10-14 DIAGNOSIS — Z7951 Long term (current) use of inhaled steroids: Secondary | ICD-10-CM | POA: Diagnosis not present

## 2021-10-14 DIAGNOSIS — R079 Chest pain, unspecified: Secondary | ICD-10-CM

## 2021-10-14 DIAGNOSIS — Z79899 Other long term (current) drug therapy: Secondary | ICD-10-CM | POA: Diagnosis not present

## 2021-10-14 DIAGNOSIS — E119 Type 2 diabetes mellitus without complications: Secondary | ICD-10-CM | POA: Diagnosis not present

## 2021-10-14 DIAGNOSIS — R112 Nausea with vomiting, unspecified: Secondary | ICD-10-CM | POA: Diagnosis present

## 2021-10-14 DIAGNOSIS — I10 Essential (primary) hypertension: Secondary | ICD-10-CM | POA: Insufficient documentation

## 2021-10-14 DIAGNOSIS — Z7901 Long term (current) use of anticoagulants: Secondary | ICD-10-CM | POA: Diagnosis not present

## 2021-10-14 LAB — CBC WITH DIFFERENTIAL/PLATELET
Abs Immature Granulocytes: 0 10*3/uL (ref 0.00–0.07)
Basophils Absolute: 0 10*3/uL (ref 0.0–0.1)
Basophils Relative: 0 %
Eosinophils Absolute: 0.2 10*3/uL (ref 0.0–0.5)
Eosinophils Relative: 5 %
HCT: 32.6 % — ABNORMAL LOW (ref 36.0–46.0)
Hemoglobin: 10.7 g/dL — ABNORMAL LOW (ref 12.0–15.0)
Immature Granulocytes: 0 %
Lymphocytes Relative: 29 %
Lymphs Abs: 1.3 10*3/uL (ref 0.7–4.0)
MCH: 29.6 pg (ref 26.0–34.0)
MCHC: 32.8 g/dL (ref 30.0–36.0)
MCV: 90.1 fL (ref 80.0–100.0)
Monocytes Absolute: 0.3 10*3/uL (ref 0.1–1.0)
Monocytes Relative: 7 %
Neutro Abs: 2.7 10*3/uL (ref 1.7–7.7)
Neutrophils Relative %: 59 %
Platelets: 212 10*3/uL (ref 150–400)
RBC: 3.62 MIL/uL — ABNORMAL LOW (ref 3.87–5.11)
RDW: 14 % (ref 11.5–15.5)
WBC: 4.5 10*3/uL (ref 4.0–10.5)
nRBC: 0 % (ref 0.0–0.2)

## 2021-10-14 LAB — COMPREHENSIVE METABOLIC PANEL
ALT: 21 U/L (ref 0–44)
AST: 27 U/L (ref 15–41)
Albumin: 3.8 g/dL (ref 3.5–5.0)
Alkaline Phosphatase: 44 U/L (ref 38–126)
Anion gap: 4 — ABNORMAL LOW (ref 5–15)
BUN: 13 mg/dL (ref 8–23)
CO2: 31 mmol/L (ref 22–32)
Calcium: 9 mg/dL (ref 8.9–10.3)
Chloride: 105 mmol/L (ref 98–111)
Creatinine, Ser: 0.87 mg/dL (ref 0.44–1.00)
GFR, Estimated: >60 mL/min (ref >60)
Glucose, Bld: 114 mg/dL — ABNORMAL HIGH (ref 70–99)
Potassium: 3.6 mmol/L (ref 3.5–5.1)
Sodium: 140 mmol/L (ref 135–145)
Total Bilirubin: 0.6 mg/dL (ref 0.3–1.2)
Total Protein: 7.4 g/dL (ref 6.5–8.1)

## 2021-10-14 LAB — TROPONIN I (HIGH SENSITIVITY): Troponin I (High Sensitivity): 5 ng/L (ref <18)

## 2021-10-14 LAB — LIPASE, BLOOD: Lipase: 36 U/L (ref 11–51)

## 2021-10-14 MED ORDER — HYDROXYZINE HCL 25 MG PO TABS
25.0000 mg | ORAL_TABLET | Freq: Once | ORAL | Status: AC
  Administered 2021-10-14: 25 mg via ORAL
  Filled 2021-10-14: qty 1

## 2021-10-14 MED ORDER — ONDANSETRON HCL 4 MG/2ML IJ SOLN
4.0000 mg | Freq: Once | INTRAMUSCULAR | Status: AC
  Administered 2021-10-14: 4 mg via INTRAVENOUS
  Filled 2021-10-14: qty 2

## 2021-10-14 MED ORDER — HYDROXYZINE HCL 25 MG PO TABS: 25.0000 mg | ORAL_TABLET | Freq: Three times a day (TID) | ORAL | 0 refills | Status: DC | PRN

## 2021-10-14 MED ORDER — HYDROXYZINE HCL 25 MG PO TABS: 25.0000 mg | ORAL_TABLET | Freq: Three times a day (TID) | ORAL | 0 refills | Status: AC | PRN

## 2021-10-14 NOTE — ED Provider Notes (Signed)
[facility] EMERGENCY DEPARTMENT Provider Note   CSN: [id number] Arrival date & time: 10/14/21  1809     History  Chief Complaint  Patient presents with   Medication Reaction    Mary HolmsShilda T [name] is a 66 y.o. female with a history of COPD, hypertension, diabetes.  Presents to the emergency department with a chief complaint of nausea, vomiting, pruritus and chest pain.  Patient reports that her symptoms started yesterday evening after she took a dose of Suboxone which was prescribed for pain.  Patient reports that she has had persistent nausea and vomiting since then.  Patient reports that she has vomited upwards of 20 times.  Describes emesis as stomach contents and bilious.  Patient reports that she has had constant pruritus as well.  She denies any trouble swallowing, dyspnea, rash, facial swelling.  Patient reports that she has never received Suboxone for pain management prior to yesterday.  Patient reports that she does not take opiates or illicit street drugs.  Patient states that approximately yesterday at 10 PM she developed a onset of chest pain.  Pain is located to the left side of her chest and described as a tightness.  Patient states that this lasted for approximately 30 to 45 minutes.  It resolved after she took a nitroglycerin.  Patient has not had any chest pain since.  She denies any associated diaphoresis, shortness of breath, palpitations, lightheadedness, or syncope.    HPI     Home Medications Prior to Admission medications   Medication Sig Start Date End Date Taking? Authorizing Provider  albuterol (VENTOLIN HFA) 108 (90 Base) MCG/ACT inhaler 2 puffs every 4 (four) hours as needed for wheezing or shortness of breath.    [provider]  amLODipine (NORVASC) 10 MG tablet amlodipine 10 mg tablet    [provider]  celecoxib (CELEBREX) 400 MG capsule Take 400 mg by mouth 2 (two) times daily. 03/31/21   [provider]   cyclobenzaprine (FLEXERIL) 10 MG tablet Take 5-10 mg by mouth 3 (three) times daily as needed for muscle spasms. 03/29/21   [provider]  diclofenac (VOLTAREN) 50 MG EC tablet Take 50 mg by mouth 2 (two) times daily as needed for pain. 03/23/21   [provider]  diclofenac Sodium (VOLTAREN) 1 % GEL Apply 1 application topically 4 (four) times daily. 04/07/21   [provider]  dicyclomine (BENTYL) 20 MG tablet Take 1 tablet (20 mg total) by mouth 2 (two) times daily. 06/15/21   Gailen ShelterFondaw, Wylder S, PA  diphenhydrAMINE (BENADRYL) 25 MG tablet Take 1 tablet (25 mg total) by mouth every 6 (six) hours as needed. 06/15/21   Gailen ShelterFondaw, Wylder S, PA  DULoxetine (CYMBALTA) 60 MG capsule Take 120 mg by mouth daily. 03/23/21   [provider]  esomeprazole (NEXIUM) 40 MG capsule esomeprazole magnesium 40 mg capsule,delayed release  TAKE 1 CAPSULE BY MOUTH EVERY DAY 04/07/14   [provider]  furosemide (LASIX) 40 MG tablet 40 mg daily as needed for fluid or edema. 11/19/16   [provider]  hydrALAZINE (APRESOLINE) 25 MG tablet Take 25 mg by mouth daily.    [provider]  Ipratropium-Albuterol (COMBIVENT RESPIMAT) 20-100 MCG/ACT AERS respimat 1 puff every 6 (six) hours as needed for wheezing or shortness of breath. 04/17/14   [provider]  isosorbide mononitrate (IMDUR) 30 MG 24 hr tablet Take 1 tablet (30 mg total) by mouth daily. 08/19/21 05/16/22  Baldo DaubMunley, Brian J, MD  losartan (  COZAAR) 100 MG tablet Take 100 mg by mouth daily. 04/03/21   [provider]  meclizine (ANTIVERT) 25 MG tablet Take 25 mg by mouth 3 (three) times daily as needed for dizziness. 10/16/20   [provider]  metoCLOPramide (REGLAN) 10 MG tablet Take 1 tablet (10 mg total) by mouth every 6 (six) hours. 06/15/21   Gailen Shelter, PA  metoprolol succinate (TOPROL XL) 25 MG 24 hr tablet Take 1 tablet (25 mg total) by mouth daily. 08/19/21   Baldo Daub, MD  metoprolol tartrate (LOPRESSOR) 100 MG tablet Take 1 tablet (100 mg total) by mouth once for 1 dose. Take two hours prior to your cardiac CT 04/13/21 04/13/21  Baldo Daub, MD  nitroGLYCERIN (NITROSTAT) 0.4 MG SL tablet Place 1 tablet (0.4 mg total) under the tongue every 5 (five) minutes as needed. 06/03/21 09/01/21  Baldo Daub, MD  nortriptyline (PAMELOR) 50 MG capsule nortriptyline 50 mg capsule    [provider]  ondansetron (ZOFRAN) 4 MG tablet Take 4 mg by mouth every 8 (eight) hours as needed for nausea.    [provider]  potassium chloride (KLOR-CON M) 10 MEQ tablet 10 mEq daily as needed (When taking lasix for edema).    [provider]  predniSONE (DELTASONE) 20 MG tablet Take 2 tablets (40 mg total) by mouth daily. 08/25/21   Renne Crigler, PA-C  rivaroxaban (XARELTO) 20 MG TABS tablet 20 mg daily with supper.    [provider]  rosuvastatin (CRESTOR) 20 MG tablet Take 20 mg by mouth daily. 08/09/10   [provider]  SUBOXONE 2-0.5 MG FILM Place 3 strips under the tongue daily. 10/11/21   [provider]  Tiotropium Bromide-Olodaterol (STIOLTO RESPIMAT) 2.5-2.5 MCG/ACT AERS INHALE 2 PUFFS BY MOUTH INTO THE LUNGS DAILY 04/05/21   Oretha Milch, MD  VICTOZA 18 MG/3ML SOPN Inject 1.8 mg into the skin daily. 11/15/19   [provider]  Vitamin D, Ergocalciferol, (DRISDOL) 1.25 MG (50000 UNIT) CAPS capsule Take 50,000 Units by mouth once a week. 03/23/21   [provider]      Allergies    Codeine, Codeine phosphate, and Tramadol-acetaminophen    Review of Systems   Review of Systems  Constitutional:  Negative for chills and fever.  HENT:  Negative for facial swelling.   Eyes:  Negative for visual disturbance.  Respiratory:  Negative for shortness of breath.   Cardiovascular:  Positive for chest pain. Negative for palpitations and leg swelling.  Gastrointestinal:  Positive for nausea and vomiting.  Negative for abdominal distention, abdominal pain, anal bleeding, blood in stool, constipation, diarrhea and rectal pain.  Genitourinary:  Negative for difficulty urinating and dysuria.  Musculoskeletal:  Negative for back pain and neck pain.  Skin:  Negative for color change and rash.  Neurological:  Negative for dizziness, syncope, light-headedness and headaches.  Psychiatric/Behavioral:  Negative for confusion.     Physical Exam Updated Vital Signs BP 140/80 (BP Location: Right Arm)   Pulse 78   Temp 98.3 F (36.8 C) (Oral)   Resp 20   Ht 5\' 4"  (1.626 m)   Wt 102.1 kg   SpO2 99%   BMI 38.62 kg/m  Physical Exam Vitals and nursing note reviewed.  Constitutional:      General: She is not in acute distress.    Appearance: She is not ill-appearing, toxic-appearing or diaphoretic.  HENT:     Head: Normocephalic.  Eyes:  General: No scleral icterus.       Right eye: No discharge.        Left eye: No discharge.  Cardiovascular:     Rate and Rhythm: Normal rate.     Pulses:          Radial pulses are 2+ on the right side and 2+ on the left side.  Pulmonary:     Effort: Pulmonary effort is normal. No tachypnea or respiratory distress.     Breath sounds: Normal breath sounds. No stridor.  Abdominal:     General: Abdomen is protuberant. There is no distension. There are no signs of injury.     Palpations: Abdomen is soft. There is no mass or pulsatile mass.     Tenderness: There is no abdominal tenderness. There is no guarding or rebound.  Skin:    General: Skin is warm and dry.  Neurological:     General: No focal deficit present.     Mental Status: She is alert.  Psychiatric:        Behavior: Behavior is cooperative.     ED Results / Procedures / Treatments   Labs (all labs ordered are listed, but only abnormal results are displayed) Labs Reviewed  COMPREHENSIVE METABOLIC PANEL - Abnormal; Notable for the following components:      Result Value   Glucose, Bld 114  (*)    Anion gap 4 (*)    All other components within normal limits  CBC WITH DIFFERENTIAL/PLATELET - Abnormal; Notable for the following components:   RBC 3.62 (*)    Hemoglobin 10.7 (*)    HCT 32.6 (*)    All other components within normal limits  LIPASE, BLOOD  TROPONIN I (HIGH SENSITIVITY)    EKG EKG Interpretation  Date/Time:  Thursday October 14 2021 20:44:02 EDT Ventricular Rate:  77 PR Interval:  152 QRS Duration: 108 QT Interval:  420 QTC Calculation: 476 R Axis:   -55 Text Interpretation: Sinus rhythm Left anterior fascicular block Abnormal R-wave progression, early transition Left ventricular hypertrophy No significant change since last tracing Confirmed by Richardean Canal [phone]) on 10/14/2021 10:31:28 PM  Radiology DG Chest Portable 1 View  Result Date: 10/14/2021 CLINICAL DATA:  chest pain EXAM: PORTABLE CHEST 1 VIEW.  Patient is rotated COMPARISON:  Chest x-ray 08/25/2021, CT chest 08/25/2021 FINDINGS: Prominent cardiac silhouette likely due to AP portable technique. The heart and mediastinal contours are within normal limits. Aortic calcification. No focal consolidation. No pulmonary edema. No pleural effusion. No pneumothorax. No acute osseous abnormality. IMPRESSION: 1. No active disease. 2.  Aortic Atherosclerosis (ICD10-I70.0). Electronically Signed   By: Tish Frederickson M.D.   On: 10/14/2021 20:49    Procedures Procedures    Medications Ordered in ED Medications  ondansetron [facility]) injection 4 mg (4 mg Intravenous Given 10/14/21 2126)  hydrOXYzine (ATARAX) tablet 25 mg (25 mg Oral Given 10/14/21 2129)    ED Course/ Medical Decision Making/ A&P                           Medical Decision Making Amount and/or Complexity of Data Reviewed Labs: ordered. Radiology: ordered.  Risk Prescription drug management.   Alert 66 year old female in no acute distress, nontoxic-appearing.  Presents to the ED with a chief complaint of pruritus, nausea, vomiting, and chest  pain.  Information obtained from patient.  I reviewed patient's past medical record including previous provider notes, labs, and imaging.  Patient has  medical history as outlined in HPI which complicates her care.  With reports of chest pain will obtain EKG, chest x-ray, and troponin for ACS work-up.  With reports of persistent nausea and vomiting will obtain CMP, CBC, and lipase.  No signs of anaphylactic reaction at this time.  We will give patient Zofran and Atarax for her nausea/vomiting and pruritus.  I personally viewed and interpreted patient's EKG.  Tracing shows sinus rhythm with LVH.  I personally viewed and interpreted patient's chest x-ray.  Imaging shows no active cardiopulmonary di  I personally viewed and interpreted patient's lab results.  Pertinent findings include: -CMP and lipase unremarkable. -Anemia with hemoglobin 10.7, slightly below patient's baseline. -Troponin 5.  Low suspicion for ACS at this time with troponin within normal limits as patient's pain was greater than 10 hours prior and she has not had any since.  Patient has resolution of pruritus after receiving hydroxyzine.  Patient has resolution of nausea and vomiting after receiving Zofran.  Able to tolerate p.o. intake without difficulty.  Unclear if patient had an allergic reaction to the Suboxone.  Patient was advised to discontinue this medication and speak to her pain management provider regarding this medication.  Will prescribe patient with course of Atarax.  Patient has prescription for Zofran at home.  Was offered OTD Zofran however declines.  Patient care discussed with attending physician Dr.Yao  Based on patient's chief complaint, I considered admission might be necessary, however after reassuring ED workup feel patient is reasonable for discharge.  Discussed results, findings, treatment and follow up. Patient advised of return precautions. Patient verbalized understanding and agreed with  plan.  Portions of this note were generated with Scientist, clinical (histocompatibility and immunogenetics). Dictation errors may occur despite best attempts at proofreading.         Final Clinical Impression(s) / ED Diagnoses Final diagnoses:  Nausea and vomiting, unspecified vomiting type  Chest pain, unspecified type  Pruritus    Rx / DC Orders ED Discharge Orders          Ordered    hydrOXYzine (ATARAX) 25 MG tablet  Every 8 hours PRN,   Status:  Discontinued        10/14/21 2227    hydrOXYzine (ATARAX) 25 MG tablet  Every 8 hours PRN        10/14/21 2300              Berneice Heinrich 10/15/21 0007    Charlynne Pander, MD 10/15/21 1556

## 2021-10-14 NOTE — ED Triage Notes (Signed)
Patient states she started taking suboxone and states she is having nausea/vomiting,/hives/itching and feels high. Patients airway is intact. States "I feel like I'm jumpin out of my skin"

## 2021-10-14 NOTE — Discharge Instructions (Addendum)
You came to the emergency department today to be evaluated for your chest pain, nausea, vomiting and itching.  Your EKG, chest x-ray, and lab results were reassuring that you are not having a heart attack today.  Your symptoms improved after receiving medications in the emergency department.  Due to the potential of a an allergic reaction do not take Suboxone.  Please follow-up closely with your pain management clinic for repeat assessment.  Also please follow-up with your primary care doctor due to your report of chest pain.  Please return to the emergency department if you develop any new or concerning symptoms.

## 2021-11-22 ENCOUNTER — Encounter: Payer: Self-pay | Admitting: Physician Assistant

## 2022-01-28 ENCOUNTER — Encounter (HOSPITAL_BASED_OUTPATIENT_CLINIC_OR_DEPARTMENT_OTHER): Payer: Self-pay | Admitting: Emergency Medicine

## 2022-01-28 ENCOUNTER — Other Ambulatory Visit: Payer: Self-pay

## 2022-01-28 ENCOUNTER — Emergency Department (HOSPITAL_BASED_OUTPATIENT_CLINIC_OR_DEPARTMENT_OTHER)
Admission: EM | Admit: 2022-01-28 | Discharge: 2022-01-28 | Disposition: A | Discharge status: Home / Self Care | Payer: 59 | Attending: Emergency Medicine | Admitting: Emergency Medicine

## 2022-01-28 DIAGNOSIS — J449 Chronic obstructive pulmonary disease, unspecified: Secondary | ICD-10-CM | POA: Insufficient documentation

## 2022-01-28 DIAGNOSIS — L02211 Cutaneous abscess of abdominal wall: Secondary | ICD-10-CM | POA: Insufficient documentation

## 2022-01-28 DIAGNOSIS — Z79899 Other long term (current) drug therapy: Secondary | ICD-10-CM | POA: Diagnosis not present

## 2022-01-28 DIAGNOSIS — E119 Type 2 diabetes mellitus without complications: Secondary | ICD-10-CM | POA: Diagnosis not present

## 2022-01-28 DIAGNOSIS — Z7901 Long term (current) use of anticoagulants: Secondary | ICD-10-CM | POA: Insufficient documentation

## 2022-01-28 DIAGNOSIS — I10 Essential (primary) hypertension: Secondary | ICD-10-CM | POA: Insufficient documentation

## 2022-01-28 MED ORDER — LIDOCAINE-EPINEPHRINE (PF) 2 %-1:200000 IJ SOLN
10.0000 mL | Freq: Once | INTRAMUSCULAR | Status: AC
  Administered 2022-01-28: 10 mL
  Filled 2022-01-28: qty 20

## 2022-01-28 MED ORDER — DOXYCYCLINE HYCLATE 100 MG PO CAPS: 100.0000 mg | ORAL_CAPSULE | Freq: Two times a day (BID) | ORAL | 0 refills | Status: DC

## 2022-01-28 NOTE — ED Triage Notes (Signed)
Pt arrives to ED with c/o abscess to upper abdomen that started over the past week.

## 2022-01-28 NOTE — Discharge Instructions (Addendum)
Note your work-up today was overall consistent with abscess.  We drained this while emergency department.  We will place you on oral antibiotics at home to take for the next several days.  Close follow-up with PCP recommended in 2 to 3 days for reevaluation of the area.  You can take Tylenol/ibuprofen as needed for pain.  Please do not hesitate to return to the emergency department if the worrisome signs and symptoms we discussed become apparent.

## 2022-01-28 NOTE — ED Provider Notes (Signed)
[facility] EMERGENCY DEPT Provider Note   CSN: [id number] Arrival date & time: 01/28/22  1332     History  Chief Complaint  Patient presents with   Abscess    [name] is a 66 y.o. female.   Abscess   66 year old female presents emergency department with complaints of abscess.  Patient states that areas been present for the past 2 days.  Denies drainage from the area or systemic fever/chills.  Patient states that she had a bug bite in the urinary couple months ago which was treated with oral antibiotics due to secondary skin infection.  She states that area has been well-healed up until 2 days ago.  She is not currently taking antibiotics for this.  Area is located midline just beneath xiphoid process.  Past medical history significant for COPD, diabetes, DVT, hyperlipidemia, hypertension, OSA  Home Medications Prior to Admission medications   Medication Sig Start Date End Date Taking? Authorizing Provider  doxycycline (VIBRAMYCIN) 100 MG capsule Take 1 capsule (100 mg total) by mouth 2 (two) times daily. 01/28/22  Yes Sherian Maroon A, PA  albuterol (VENTOLIN HFA) 108 (90 Base) MCG/ACT inhaler 2 puffs every 4 (four) hours as needed for wheezing or shortness of breath.    [provider]  amLODipine (NORVASC) 10 MG tablet amlodipine 10 mg tablet    [provider]  celecoxib (CELEBREX) 400 MG capsule Take 400 mg by mouth 2 (two) times daily. 03/31/21   [provider]  cyclobenzaprine (FLEXERIL) 10 MG tablet Take 5-10 mg by mouth 3 (three) times daily as needed for muscle spasms. 03/29/21   [provider]  diclofenac (VOLTAREN) 50 MG EC tablet Take 50 mg by mouth 2 (two) times daily as needed for pain. 03/23/21   [provider]  diclofenac Sodium (VOLTAREN) 1 % GEL Apply 1 application topically 4 (four) times daily. 04/07/21   [provider]  dicyclomine (BENTYL) 20 MG tablet Take 1 tablet (20 mg total)  by mouth 2 (two) times daily. 06/15/21   Gailen Shelter, PA  DULoxetine (CYMBALTA) 60 MG capsule Take 120 mg by mouth daily. 03/23/21   [provider]  esomeprazole (NEXIUM) 40 MG capsule esomeprazole magnesium 40 mg capsule,delayed release  TAKE 1 CAPSULE BY MOUTH EVERY DAY 04/07/14   [provider]  furosemide (LASIX) 40 MG tablet 40 mg daily as needed for fluid or edema. 11/19/16   [provider]  hydrALAZINE (APRESOLINE) 25 MG tablet Take 25 mg by mouth daily.    [provider]  Ipratropium-Albuterol (COMBIVENT RESPIMAT) 20-100 MCG/ACT AERS respimat 1 puff every 6 (six) hours as needed for wheezing or shortness of breath. 04/17/14   [provider]  isosorbide mononitrate (IMDUR) 30 MG 24 hr tablet Take 1 tablet (30 mg total) by mouth daily. 08/19/21 05/16/22  Baldo Daub, MD  losartan (COZAAR) 100 MG tablet Take 100 mg by mouth daily. 04/03/21   [provider]  meclizine (ANTIVERT) 25 MG tablet Take 25 mg by mouth 3 (three) times daily as needed for dizziness. 10/16/20   [provider]  metoCLOPramide (REGLAN) 10 MG tablet Take 1 tablet (10 mg total) by mouth every 6 (six) hours. 06/15/21   Gailen Shelter, PA  metoprolol succinate (TOPROL XL) 25 MG 24 hr tablet Take 1 tablet (25 mg total) by mouth daily. 08/19/21   Baldo Daub, MD  metoprolol tartrate (LOPRESSOR) 100 MG tablet Take 1 tablet (100 mg total) by mouth  once for 1 dose. Take two hours prior to your cardiac CT 04/13/21 04/13/21  Baldo DaubMunley, Brian J, MD  nitroGLYCERIN (NITROSTAT) 0.4 MG SL tablet Place 1 tablet (0.4 mg total) under the tongue every 5 (five) minutes as needed. 06/03/21 09/01/21  Baldo DaubMunley, Brian J, MD  nortriptyline (PAMELOR) 50 MG capsule nortriptyline 50 mg capsule    [provider]  ondansetron (ZOFRAN) 4 MG tablet Take 4 mg by mouth every 8 (eight) hours as needed for nausea.    [provider]  potassium chloride (KLOR-CON M) 10 MEQ  tablet 10 mEq daily as needed (When taking lasix for edema).    [provider]  predniSONE (DELTASONE) 20 MG tablet Take 2 tablets (40 mg total) by mouth daily. 08/25/21   Renne CriglerGeiple, Joshua, PA-C  rivaroxaban (XARELTO) 20 MG TABS tablet 20 mg daily with supper.    [provider]  rosuvastatin (CRESTOR) 20 MG tablet Take 20 mg by mouth daily. 08/09/10   [provider]  SUBOXONE 2-0.5 MG FILM Place 3 strips under the tongue daily. 10/11/21   [provider]  Tiotropium Bromide-Olodaterol (STIOLTO RESPIMAT) 2.5-2.5 MCG/ACT AERS INHALE 2 PUFFS BY MOUTH INTO THE LUNGS DAILY 04/05/21   Oretha MilchAlva, Rakesh V, MD  VICTOZA 18 MG/3ML SOPN Inject 1.8 mg into the skin daily. 11/15/19   [provider]  Vitamin D, Ergocalciferol, (DRISDOL) 1.25 MG (50000 UNIT) CAPS capsule Take 50,000 Units by mouth once a week. 03/23/21   [provider]      Allergies    Codeine, Codeine phosphate, and Tramadol-acetaminophen    Review of Systems   Review of Systems  All other systems reviewed and are negative.   Physical Exam Updated Vital Signs BP 108/65   Pulse 81   Temp 97.7 F (36.5 C) (Temporal)   Resp 17   Ht 5\' 4"  (1.626 m)   Wt 103 kg   SpO2 97%   BMI 38.96 kg/m  Physical Exam Vitals and nursing note reviewed.  Constitutional:      General: She is not in acute distress.    Appearance: She is well-developed. She is not ill-appearing, toxic-appearing or diaphoretic.  HENT:     Head: Normocephalic and atraumatic.  Eyes:     Conjunctiva/sclera: Conjunctivae normal.  Cardiovascular:     Rate and Rhythm: Normal rate and regular rhythm.     Heart sounds: No murmur heard. Pulmonary:     Effort: Pulmonary effort is normal. No respiratory distress.     Breath sounds: Normal breath sounds.  Abdominal:     Palpations: Abdomen is soft.     Tenderness: There is no abdominal tenderness.  Musculoskeletal:        General: No swelling.     Cervical back: Neck  supple.  Skin:    General: Skin is warm and dry.     Capillary Refill: Capillary refill takes less than 2 seconds.     Comments: 1.5 cm in diameter area of fluctuance noted superficially just beneath patient's xiphoid process midline.  No surrounding erythema noted.  No active drainage from site.  Neurological:     Mental Status: She is alert.  Psychiatric:        Mood and Affect: Mood normal.     ED Results / Procedures / Treatments   Labs (all labs ordered are listed, but only abnormal results are displayed) Labs Reviewed - No data to display  EKG None  Radiology No results found.  Procedures .Marland Kitchen.Incision and Drainage  Date/Time: 01/28/2022 3:07 PM  Performed by: Peter Garter, PA Authorized by: Peter Garter, PA   Consent:    Consent obtained:  Verbal   Consent given by:  Patient   Risks, benefits, and alternatives were discussed: yes     Risks discussed:  Bleeding, incomplete drainage, infection, damage to other organs and pain   Alternatives discussed:  No treatment, delayed treatment, alternative treatment, observation and referral Universal protocol:    Procedure explained and questions answered to patient or proxy's satisfaction: yes     Patient identity confirmed:  Verbally with patient Location:    Type:  Abscess   Size:  2.0 cm   Location:  Trunk   Trunk location:  Abdomen Pre-procedure details:    Skin preparation:  Povidone-iodine Sedation:    Sedation type:  None Anesthesia:    Anesthesia method:  Local infiltration   Local anesthetic:  Lidocaine 2% WITH epi Procedure type:    Complexity:  Simple Procedure details:    Ultrasound guidance: no     Needle aspiration: no     Incision types:  Single straight   Wound management:  Probed and deloculated and irrigated with saline   Drainage:  Bloody and serosanguinous   Drainage amount:  Scant   Wound treatment:  Wound left open   Packing materials:  None Post-procedure details:    Procedure  completion:  Tolerated well, no immediate complications     Medications Ordered in ED Medications  lidocaine-EPINEPHrine (XYLOCAINE W/EPI) 2 %-1:200000 (PF) injection 10 mL (10 mLs Infiltration Given 01/28/22 1415)    ED Course/ Medical Decision Making/ A&P                           Medical Decision Making Risk Prescription drug management.   This patient presents to the ED for concern of abscess, this involves an extensive number of treatment options, and is a complaint that carries with it a high risk of complications and morbidity.  The differential diagnosis includes abscess, erysipelas, foreign body retention, cellulitis   Co morbidities that complicate the patient evaluation  See HPI   Additional history obtained:  Additional history obtained from EMR   Lab Tests:  N/a   Imaging Studies ordered:  N/a   Cardiac Monitoring: / EKG:  The patient was maintained on a cardiac monitor.  I personally viewed and interpreted the cardiac monitored which showed an underlying rhythm of: Sinus rhythm   Consultations Obtained:  N/a   Problem List / ED Course / Critical interventions / Medication management  Abscess I ordered medication including 2% lidocaine with epinephrine with local infiltrative anesthetic   Reevaluation of the patient after these medicines showed that the patient improved I have reviewed the patients home medicines and have made adjustments as needed   Social Determinants of Health:  Denies illicit drug use but does state she is currently smokes cigarettes.   Test / Admission - Considered:  Abscess Vitals signs within normal range and stable throughout visit. Laboratory studies considered but patient afebrile with no signs of systemic illness. Abscess was drained in manner depicted above.  Patient will be placed on oral antibiotics for the next 7 days.  Symptomatic therapy recommended with washing area with warm soapy water least twice  daily with appropriate bandage changes.  Close follow-up with PCP recommended in 2 to 3 days for reevaluation of wound.  Treatment plan discussed at length with patient and she is  understanding and was agreeable to said plan.;  Worrisome signs and symptoms were discussed with the patient, and the patient acknowledged understanding to return to the ED if noticed. Patient was stable upon discharge.          Final Clinical Impression(s) / ED Diagnoses Final diagnoses:  Abscess of skin of abdomen    Rx / DC Orders ED Discharge Orders          Ordered    doxycycline (VIBRAMYCIN) 100 MG capsule  2 times daily        01/28/22 1511              Wilnette Kales, [place] 01/28/22 1511    Dorie Rank, MD 01/31/22 [phone]

## 2022-02-22 ENCOUNTER — Ambulatory Visit: Payer: 59 | Admitting: Cardiology

## 2022-02-22 NOTE — Progress Notes (Deleted)
Cardiology Office Note:    Date:  02/22/2022   ID:  [name], [name] 07-23-1955, MRN [id number]  PCP:  Jackie Plum, MD  Cardiologist:  Norman Herrlich, MD    Referring MD: Jackie Plum, MD    ASSESSMENT:    No diagnosis found. PLAN:    In order of problems listed above:  ***   Next appointment: ***   Medication Adjustments/Labs and Tests Ordered: Current medicines are reviewed at length with the patient today.  Concerns regarding medicines are outlined above.  No orders of the defined types were placed in this encounter.  No orders of the defined types were placed in this encounter.   No chief complaint on file.   History of Present Illness:    [name] is a 66 y.o. female with a hx of mild CAD history of venous thromboembolism pulmonary embolism on Xarelto hypertension hyperlipidemia struct of sleep apnea and COPD.  Last seen ***. 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 She had hematology evaluation was judged to be hypercoagulable and has been on Xarelto since 2019. She has coronary artery calcification on CT scan.  Coronary CTA performed 05/13/2021 showed a score of 386 94th percentile she had extensive coronary plaque diffusely but no focal stenotic lesion most severe 25 to 49% in the left circumflex and right coronary artery.  Compliance with diet, lifestyle and medications: *** Past Medical History:  Diagnosis Date   COPD (chronic obstructive pulmonary disease) (HCC)    Coronary artery calcification seen on CT scan    Diabetes (HCC)    DVT (deep venous thrombosis) (HCC)    Hyperlipidemia    Hypertension    Observed sleep apnea     Past Surgical History:  Procedure Laterality Date   ABDOMINAL HYSTERECTOMY     KNEE ARTHROSCOPY Bilateral    TOTAL KNEE ARTHROPLASTY Bilateral     Current Medications: No outpatient medications have been marked as taking for the  02/22/22 encounter (Appointment) with Baldo Daub, MD.     Allergies:   Codeine, Codeine phosphate, and Tramadol-acetaminophen   Social History   Socioeconomic History   Marital status: Single    Spouse name: Not on file   Number of children: Not on file   Years of education: Not on file   Highest education level: Not on file  Occupational History   Not on file  Tobacco Use   Smoking status: Every Day    Packs/day: 1.50    Years: 55.00    Total pack years: 82.50    Types: Cigarettes    Start date: 06/07/1964    Passive exposure: Current   Smokeless tobacco: Never  Vaping Use   Vaping Use: Never used  Substance and Sexual Activity   Alcohol use: Not Currently   Drug use: Yes    Types: Marijuana, "Crack" cocaine    Comment: None since 1999   Sexual activity: Not on file  Other Topics Concern   Not on file  Social History Narrative   Not on file   Social Determinants of Health   Financial Resource Strain: Not on file  Food Insecurity: Not on file  Transportation Needs: Not on file  Physical Activity: Not on file  Stress: Not on file  Social Connections: Not on file     Family History: The patient's ***family history includes Diabetes in her father; Healthy in her sister; Heart Problems in her  mother; Heart attack in her father; Hypertension in her mother; Pulmonary embolism in her brother, brother, mother, and sister. ROS:   Please see the history of present illness.    All other systems reviewed and are negative.  EKGs/Labs/Other Studies Reviewed:    The following studies were reviewed today:  EKG:  EKG ordered today and personally reviewed.  The ekg ordered today demonstrates ***  Recent Labs: 10/14/2021: ALT 21; BUN 13; Creatinine, Ser 0.87; Hemoglobin 10.7; Platelets 212; Potassium 3.6; Sodium 140  Recent Lipid Panel    Component Value Date/Time   CHOL 203 (H) 06/03/2021 0939   TRIG 100 06/03/2021 0939   HDL 58 06/03/2021 0939   CHOLHDL 3.5  06/03/2021 0939   CHOLHDL 3.7 CALC 07/23/2007 1023   VLDL 18 07/23/2007 1023   LDLCALC 127 (H) 06/03/2021 0939   LDLDIRECT 165.0 04/13/2007 0938    Physical Exam:    VS:  There were no vitals taken for this visit.    Wt Readings from Last 3 Encounters:  01/28/22 227 lb (103 kg)  10/14/21 225 lb (102.1 kg)  08/25/21 214 lb (97.1 kg)     GEN: *** Well nourished, well developed in no acute distress HEENT: Normal NECK: No JVD; No carotid bruits LYMPHATICS: No lymphadenopathy CARDIAC: ***RRR, no murmurs, rubs, gallops RESPIRATORY:  Clear to auscultation without rales, wheezing or rhonchi  ABDOMEN: Soft, non-tender, non-distended MUSCULOSKELETAL:  No edema; No deformity  SKIN: Warm and dry NEUROLOGIC:  Alert and oriented x 3 PSYCHIATRIC:  Normal affect    Signed, Shirlee More, MD  02/22/2022 7:43 AM    [place]

## 2022-02-27 NOTE — Progress Notes (Unsigned)
Office Visit    Patient Name: [name] Date of Encounter: 02/28/2022  Primary Care Provider:  Jackie Plum, MD Primary Cardiologist:  None Primary Electrophysiologist: None  Chief Complaint    [name] is a 66 y.o. 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  Past Medical History    Past Medical History:  Diagnosis Date   COPD (chronic obstructive pulmonary disease) (HCC)    Coronary artery calcification seen on CT scan    Diabetes (HCC)    DVT (deep venous thrombosis) (HCC)    Hyperlipidemia    Hypertension    Observed sleep apnea    Past Surgical History:  Procedure Laterality Date   ABDOMINAL HYSTERECTOMY     KNEE ARTHROSCOPY Bilateral    TOTAL KNEE ARTHROPLASTY Bilateral     Allergies  Allergies  Allergen Reactions   Codeine     REACTION: itch   Codeine Phosphate     REACTION: itching   Tramadol-Acetaminophen     REACTION: itch    History of Present Illness    [name]  is a 66 year old female with the above mention past medical history who presents today for complaint of abnormal EKG discovered at PCP.  [name] was initially seen by Dr. Dulce Sellar and 2022 for management of anticoagulation.  Patient has history of unprovoked DVT in 2006 with pulmonary embolism in 2011 and is currently on Xarelto since 2019.  She has history of hypercoagulable state per hematologic screening.  She underwent Dennison perfusion test that revealed severe breast attenuation with no evidence of ischemia and EF of 58%.  CT scan of the chest was completed 01/2020 which showed emphysema and coronary calcifications with aortic atherosclerosis.    She was last seen by Dr. Dulce Sellar 05/2021 for follow-up and complaint of chest pain.  She underwent coronary CTA 05/2021 that showed a score of 386 94th percentile she had extensive coronary plaque diffusely but  no focal stenotic lesion most severe 25 to 49% in the left circumflex and right coronary artery.  She was placed on beta-blocker therapy and Imdur 30 mg.  [name] presents today alone for follow-up of abnormal EKG.  Since last being seen in the office patient reports she has been experiencing fatigue with left arm pain and tightness under her left breast.  She experiences this discomfort with and without activity and is relieved with nitroglycerin.  She also endorses some lightheadedness associated with this new discomfort.  She is compliant with her current medications and denies any adverse reactions.  She reports that her PCP indicated a abnormality on EKG however records not available for review.  Her blood pressure today was well controlled at 130/68 and heart rate was 71 bpm.  During our visit we discussed differential diagnosis that could be related to this chest discomfort and we discussed next steps and treatment plan. Patient denies chest pain, palpitations, dyspnea, PND, orthopnea, nausea, vomiting, dizziness, syncope, edema, weight gain, or early satiety.  Home Medications    Current Outpatient Medications  Medication Sig Dispense Refill   albuterol (VENTOLIN HFA) 108 (90 Base) MCG/ACT inhaler 2 puffs every 4 (four) hours as needed for wheezing or shortness of breath.     amLODipine (NORVASC) 10 MG tablet amlodipine 10 mg tablet     celecoxib (CELEBREX) 400 MG capsule Take 400 mg by mouth 2 (two)  times daily.     cyclobenzaprine (FLEXERIL) 10 MG tablet Take 5-10 mg by mouth 3 (three) times daily as needed for muscle spasms.     diclofenac Sodium (VOLTAREN) 1 % GEL Apply 1 application topically 4 (four) times daily.     doxycycline (VIBRAMYCIN) 100 MG capsule Take 1 capsule (100 mg total) by mouth 2 (two) times daily. 14 capsule 0   DULoxetine (CYMBALTA) 60 MG capsule Take 120 mg by mouth daily.     esomeprazole (NEXIUM) 40 MG capsule esomeprazole magnesium 40 mg capsule,delayed  release  TAKE 1 CAPSULE BY MOUTH EVERY DAY     furosemide (LASIX) 40 MG tablet 40 mg daily as needed for fluid or edema.     hydrALAZINE (APRESOLINE) 25 MG tablet Take 25 mg by mouth daily.     HYDROcodone-acetaminophen (NORCO/VICODIN) 5-325 MG tablet Take 1 tablet by mouth 2 (two) times daily as needed.     Ipratropium-Albuterol (COMBIVENT RESPIMAT) 20-100 MCG/ACT AERS respimat 1 puff every 6 (six) hours as needed for wheezing or shortness of breath.     isosorbide mononitrate (IMDUR) 30 MG 24 hr tablet Take 1 tablet (30 mg total) by mouth daily. 90 tablet 2   lidocaine (LIDODERM) 5 % 1 patch every 12 (twelve) hours.     losartan (COZAAR) 100 MG tablet Take 100 mg by mouth daily.     meclizine (ANTIVERT) 25 MG tablet Take 25 mg by mouth 3 (three) times daily as needed for dizziness.     metoCLOPramide (REGLAN) 10 MG tablet Take 1 tablet (10 mg total) by mouth every 6 (six) hours. 30 tablet 0   metoprolol succinate (TOPROL XL) 25 MG 24 hr tablet Take 1 tablet (25 mg total) by mouth daily. 90 tablet 2   nortriptyline (PAMELOR) 50 MG capsule nortriptyline 50 mg capsule     ondansetron (ZOFRAN) 4 MG tablet Take 4 mg by mouth every 8 (eight) hours as needed for nausea.     OZEMPIC, 1 MG/DOSE, 4 MG/3ML SOPN Inject 1 mg into the skin once a week.     potassium chloride (KLOR-CON M) 10 MEQ tablet 10 mEq daily as needed (When taking lasix for edema).     rivaroxaban (XARELTO) 20 MG TABS tablet 20 mg daily with supper.     rosuvastatin (CRESTOR) 20 MG tablet Take 20 mg by mouth daily.     Tiotropium Bromide-Olodaterol (STIOLTO RESPIMAT) 2.5-2.5 MCG/ACT AERS INHALE 2 PUFFS BY MOUTH INTO THE LUNGS DAILY 4 g 0   Vitamin D, Ergocalciferol, (DRISDOL) 1.25 MG (50000 UNIT) CAPS capsule Take 50,000 Units by mouth once a week.     nitroGLYCERIN (NITROSTAT) 0.4 MG SL tablet Place 1 tablet (0.4 mg total) under the tongue every 5 (five) minutes as needed. 30 tablet 3   No current facility-administered medications  for this visit.     Review of Systems  Please see the history of present illness.    (+) Fatigue, lightheadedness (+) Left-sided chest pain  All other systems reviewed and are otherwise negative except as noted above.  Physical Exam    Wt Readings from Last 3 Encounters:  02/28/22 228 lb 9.6 oz (103.7 kg)  01/28/22 227 lb (103 kg)  10/14/21 225 lb (102.1 kg)   VS: Vitals:   02/28/22 0954  BP: 130/68  Pulse: 71  SpO2: 98%  ,Body mass index is 39.24 kg/m.  Constitutional:      Appearance: Healthy appearance. Not in distress.  Neck:     Vascular:  JVD normal.  Pulmonary:     Effort: Pulmonary effort is normal.     Breath sounds: No wheezing. No rales. Diminished in the bases Cardiovascular:     Normal rate. Regular rhythm. Normal S1. Normal S2.      Murmurs: There is no murmur.  Edema:    +1 left lower extremity edema that is chronic Abdominal:     Palpations: Abdomen is soft non tender. There is no hepatomegaly.  Skin:    General: Skin is warm and dry.  Neurological:     General: No focal deficit present.     Mental Status: Alert and oriented to person, place and time.     Cranial Nerves: Cranial nerves are intact.  EKG/LABS/Other Studies Reviewed    ECG personally reviewed by me today -sinus rhythm with left anterior fascicular block and flat T wave in lead III with no acute changes noted and heart rate of 71 bpm.   Lab Results  Component Value Date   WBC 4.5 10/14/2021   HGB 10.7 (L) 10/14/2021   HCT 32.6 (L) 10/14/2021   MCV 90.1 10/14/2021   PLT 212 10/14/2021   Lab Results  Component Value Date   CREATININE 0.87 10/14/2021   BUN 13 10/14/2021   NA 140 10/14/2021   K 3.6 10/14/2021   CL 105 10/14/2021   CO2 31 10/14/2021   Lab Results  Component Value Date   ALT 21 10/14/2021   AST 27 10/14/2021   ALKPHOS 44 10/14/2021   BILITOT 0.6 10/14/2021   Lab Results  Component Value Date   CHOL 203 (H) 06/03/2021   HDL 58 06/03/2021   LDLCALC 127  (H) 06/03/2021   LDLDIRECT 165.0 04/13/2007   TRIG 100 06/03/2021   CHOLHDL 3.5 06/03/2021    Lab Results  Component Value Date   HGBA1C 6.1 (H) 07/23/2007    Assessment & Plan    1.  Nonobstructive CAD/stable angina: -Cardiac CTA completed 05/2021 showing score of 386 94th percentile she had extensive coronary plaque diffusely but no focal stenotic lesion  -Continue GDMT metoprolol 25 mg daily -We will increase her Imdur to 60 mg daily due to increased chest pain -We will also refill her Nitrostat and patient advised to follow-up at the ED for any -We will obtain 2D echo due to shortness of breath to eliminate possible heart structure or valvular changes and to rule out multifactorial.  2.  Essential hypertension: -Patient's blood pressure today was well controlled today at 130/68 -Continue Norvasc 10 mg daily, losartan 100 mg daily, Toprol-XL 25 mg daily  3.  Hyperlipidemia: -Patient's last LDL was 84 -Continue Crestor 20 mg daily  4.  History of pulmonary embolism: -Patient had unprovoked DVT in 2006 and pulmonary embolism in 2011 -No reports of occult bleeding -She is currently on Xarelto 20 mg daily -Dose appropriate based on creatinine clearance of 104 mL/min  5.  COPD: -Followed by pulmonology -Continue current medications as prescribed by pulmonology   Disposition: Follow-up with None or APP in 3 weeks Shared Decision Making/Informed Consent The risks [chest pain, shortness of breath, cardiac arrhythmias, dizziness, blood pressure fluctuations, myocardial infarction, stroke/transient ischemic attack, nausea, vomiting, allergic reaction, radiation exposure, metallic taste sensation and life-threatening complications (estimated to be 1 in 10,000)], benefits (risk stratification, diagnosing coronary artery disease, treatment guidance) and alternatives of a nuclear stress test were discussed in detail with [name] and she agrees to proceed.   Medication  Adjustments/Labs and Tests Ordered: Current medicines are  reviewed at length with the patient today.  Concerns regarding medicines are outlined above.   Signed, Napoleon Form, Leodis Rains, NP 02/28/2022, 10:49 AM [facility]  Note:  This document was prepared using Dragon voice recognition software and may include unintentional dictation errors.

## 2022-02-28 ENCOUNTER — Encounter: Payer: Self-pay | Admitting: Nurse Practitioner

## 2022-02-28 ENCOUNTER — Ambulatory Visit: Payer: 59 | Attending: Cardiology | Admitting: Nurse Practitioner

## 2022-02-28 VITALS — BP 130/68 | HR 71 | Ht 64.0 in | Wt 228.6 lb

## 2022-02-28 DIAGNOSIS — R0600 Dyspnea, unspecified: Secondary | ICD-10-CM

## 2022-02-28 DIAGNOSIS — I200 Unstable angina: Secondary | ICD-10-CM

## 2022-02-28 DIAGNOSIS — I2510 Atherosclerotic heart disease of native coronary artery without angina pectoris: Secondary | ICD-10-CM

## 2022-02-28 DIAGNOSIS — J439 Emphysema, unspecified: Secondary | ICD-10-CM

## 2022-02-28 DIAGNOSIS — R0602 Shortness of breath: Secondary | ICD-10-CM

## 2022-02-28 DIAGNOSIS — I10 Essential (primary) hypertension: Secondary | ICD-10-CM

## 2022-02-28 DIAGNOSIS — J4489 Other specified chronic obstructive pulmonary disease: Secondary | ICD-10-CM

## 2022-02-28 MED ORDER — ISOSORBIDE MONONITRATE ER 60 MG PO TB24: 60.0000 mg | ORAL_TABLET | Freq: Every day | ORAL | 2 refills | Status: DC

## 2022-02-28 NOTE — Patient Instructions (Signed)
Medication Instructions:   START TAKING : IMDUR 60 MG ONCE A DAY   *If you need a refill on your cardiac medications before your next appointment, please call your pharmacy    Lab Work: [place]   If you have labs (blood work) drawn today and your tests are completely normal, you will receive your results only by: [place] (if you have MyChart) OR A paper copy in the mail If you have any lab test that is abnormal or we need to change your treatment, we will call you to review the results.   Testing/Procedures: Your physician has requested that you have an echocardiogram. Echocardiography is a painless test that uses sound waves to create images of your heart. It provides your doctor with information about the size and shape of your heart and how well your heart's chambers and valves are working. This procedure takes approximately one hour. There are no restrictions for this procedure. Please do NOT wear cologne, perfume, aftershave, or lotions (deodorant is allowed). Please arrive 15 minutes prior to your appointment time.    Your physician has requested that you have an exercise tolerance test. For further information please visit [email or web address]. Please also follow instruction sheet, as given.   Follow-Up: At [facility], you and your health needs are our priority.  As part of our continuing mission to provide you with exceptional heart care, we have created designated Provider Care Teams.  These Care Teams include your primary Cardiologist (physician) and Advanced Practice Providers (APPs -  Physician Assistants and Nurse Practitioners) who all work together to provide you with the care you need, when you need it.  We recommend signing up for the patient portal called "MyChart".  Sign up information is provided on this After Visit Summary.  MyChart is used to connect with patients for Virtual Visits (Telemedicine).  Patients are able to view lab/test  results, encounter notes, upcoming appointments, etc.  Non-urgent messages can be sent to your provider as well.   To learn more about what you can do with MyChart, go to [email or web address].    Your next appointment:   3 week(s)  The format for your next appointment:   In Person  Provider:  Ambrose Pancoast, NP   Other Instructions   Important Information About Sugar

## 2022-03-18 ENCOUNTER — Other Ambulatory Visit: Payer: Self-pay | Admitting: Cardiology

## 2022-03-18 NOTE — Telephone Encounter (Signed)
This is Dr. Hulen Shouts pt. Please address

## 2022-03-22 ENCOUNTER — Ambulatory Visit (HOSPITAL_COMMUNITY): Payer: 59 | Attending: Nurse Practitioner

## 2022-03-22 ENCOUNTER — Ambulatory Visit (INDEPENDENT_AMBULATORY_CARE_PROVIDER_SITE_OTHER): Payer: 59

## 2022-03-22 DIAGNOSIS — R0600 Dyspnea, unspecified: Secondary | ICD-10-CM | POA: Diagnosis present

## 2022-03-22 DIAGNOSIS — I200 Unstable angina: Secondary | ICD-10-CM | POA: Insufficient documentation

## 2022-03-22 LAB — ECHOCARDIOGRAM COMPLETE
Area-P 1/2: 3.11 cm2
S' Lateral: 1.6 cm

## 2022-03-23 LAB — EXERCISE TOLERANCE TEST
Angina Index: 0
Duke Treadmill Score: 4
Estimated workload: 4.7
Exercise duration (min): 3 min
Exercise duration (sec): 56 s
MPHR: 154 {beats}/min
Peak HR: 131 {beats}/min
Percent HR: 85 %
RPE: 16
Rest HR: 75 {beats}/min
ST Depression (mm): 0 mm

## 2022-03-26 NOTE — Progress Notes (Deleted)
Office Visit    Patient Name: [name] Date of Encounter: 03/26/2022  Primary Care Provider:  Jackie Plum, MD Primary Cardiologist:  None Primary Electrophysiologist: None  Chief Complaint    [name] is a 66 y.o. 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  Past Medical History    Past Medical History:  Diagnosis Date   COPD (chronic obstructive pulmonary disease) (HCC)    Coronary artery calcification seen on CT scan    Diabetes (HCC)    DVT (deep venous thrombosis) (HCC)    Hyperlipidemia    Hypertension    Observed sleep apnea    Past Surgical History:  Procedure Laterality Date   ABDOMINAL HYSTERECTOMY     KNEE ARTHROSCOPY Bilateral    TOTAL KNEE ARTHROPLASTY Bilateral     Allergies  Allergies  Allergen Reactions   Codeine     REACTION: itch   Codeine Phosphate     REACTION: itching   Tramadol-Acetaminophen     REACTION: itch    History of Present Illness    [name]  is a 66 year old female with the above mention past medical history who presents today for complaint of abnormal EKG discovered at PCP.  [name] was initially seen by Dr. Dulce Sellar and 2022 for management of anticoagulation.  Patient has history of unprovoked DVT in 2006 with pulmonary embolism in 2011 and is currently on Xarelto since 2019.  She has history of hypercoagulable state per hematologic screening.  She underwent [facility] perfusion test that revealed severe breast attenuation with no evidence of ischemia and EF of 58%.  CT scan of the chest was completed 01/2020 which showed emphysema and coronary calcifications with aortic atherosclerosis.  She was seen by Dr. Dulce Sellar 05/2021 for follow-up and complaint of chest pain.  She underwent coronary CTA 05/2021 that showed a score of 386 94th percentile she had extensive coronary plaque  diffusely but no focal stenotic lesion most severe 25 to 49% in the left circumflex and right coronary artery.  She was placed on beta-blocker therapy and Imdur 30 mg.  [name] was seen by me on 02/28/2022 for complaint of abnormal EKG.  She also had complaint of fatigue and left arm tightness.  Blood pressure during our visit was 130/68 and heart rate was 71 bpm.  I increased her Imdur to 60 mg and obtained a 2D echo that revealed no wall motion abnormalities with mild dilation of the left atrium and no evidence of regurgitation or valve dysfunction.  She was also sent for ETT that showed no ischemia with poor exercise capacity.         Since last being seen in the office patient reports***.  Patient denies chest pain, palpitations, dyspnea, PND, orthopnea, nausea, vomiting, dizziness, syncope, edema, weight gain, or early satiety.     ***Notes:  Home Medications    Current Outpatient Medications  Medication Sig Dispense Refill   albuterol (VENTOLIN HFA) 108 (90 Base) MCG/ACT inhaler 2 puffs every 4 (four) hours as needed for wheezing or shortness of breath.     amLODipine (NORVASC) 10 MG tablet amlodipine 10 mg tablet     celecoxib (CELEBREX) 400 MG capsule Take 400 mg by mouth 2 (two) times daily.     cyclobenzaprine (FLEXERIL) 10 MG tablet Take 5-10 mg by mouth 3 (  three) times daily as needed for muscle spasms.     diclofenac Sodium (VOLTAREN) 1 % GEL Apply 1 application topically 4 (four) times daily.     doxycycline (VIBRAMYCIN) 100 MG capsule Take 1 capsule (100 mg total) by mouth 2 (two) times daily. 14 capsule 0   DULoxetine (CYMBALTA) 60 MG capsule Take 120 mg by mouth daily.     esomeprazole (NEXIUM) 40 MG capsule esomeprazole magnesium 40 mg capsule,delayed release  TAKE 1 CAPSULE BY MOUTH EVERY DAY     furosemide (LASIX) 40 MG tablet 40 mg daily as needed for fluid or edema.     hydrALAZINE (APRESOLINE) 25 MG tablet Take 25 mg by mouth daily.      HYDROcodone-acetaminophen (NORCO/VICODIN) 5-325 MG tablet Take 1 tablet by mouth 2 (two) times daily as needed.     Ipratropium-Albuterol (COMBIVENT RESPIMAT) 20-100 MCG/ACT AERS respimat 1 puff every 6 (six) hours as needed for wheezing or shortness of breath.     isosorbide mononitrate (IMDUR) 60 MG 24 hr tablet Take 1 tablet (60 mg total) by mouth daily. 90 tablet 2   lidocaine (LIDODERM) 5 % 1 patch every 12 (twelve) hours.     losartan (COZAAR) 100 MG tablet Take 100 mg by mouth daily.     meclizine (ANTIVERT) 25 MG tablet Take 25 mg by mouth 3 (three) times daily as needed for dizziness.     metoCLOPramide (REGLAN) 10 MG tablet Take 1 tablet (10 mg total) by mouth every 6 (six) hours. 30 tablet 0   metoprolol succinate (TOPROL-XL) 25 MG 24 hr tablet Take 1 tablet (25 mg total) by mouth daily. 30 tablet 1   nitroGLYCERIN (NITROSTAT) 0.4 MG SL tablet Place 1 tablet (0.4 mg total) under the tongue every 5 (five) minutes as needed. 30 tablet 3   nortriptyline (PAMELOR) 50 MG capsule nortriptyline 50 mg capsule     ondansetron (ZOFRAN) 4 MG tablet Take 4 mg by mouth every 8 (eight) hours as needed for nausea.     OZEMPIC, 1 MG/DOSE, 4 MG/3ML SOPN Inject 1 mg into the skin once a week.     potassium chloride (KLOR-CON M) 10 MEQ tablet 10 mEq daily as needed (When taking lasix for edema).     rivaroxaban (XARELTO) 20 MG TABS tablet 20 mg daily with supper.     rosuvastatin (CRESTOR) 20 MG tablet Take 20 mg by mouth daily.     Tiotropium Bromide-Olodaterol (STIOLTO RESPIMAT) 2.5-2.5 MCG/ACT AERS INHALE 2 PUFFS BY MOUTH INTO THE LUNGS DAILY 4 g 0   Vitamin D, Ergocalciferol, (DRISDOL) 1.25 MG (50000 UNIT) CAPS capsule Take 50,000 Units by mouth once a week.     No current facility-administered medications for this visit.     Review of Systems  Please see the history of present illness.    (+)*** (+)***  All other systems reviewed and are otherwise negative except as noted above.  Physical  Exam    Wt Readings from Last 3 Encounters:  02/28/22 228 lb 9.6 oz (103.7 kg)  01/28/22 227 lb (103 kg)  10/14/21 225 lb (102.1 kg)   XE:NMMHW were no vitals filed for this visit.,There is no height or weight on file to calculate BMI.  Constitutional:      Appearance: Healthy appearance. Not in distress.  Neck:     Vascular: JVD normal.  Pulmonary:     Effort: Pulmonary effort is normal.     Breath sounds: No wheezing. No rales. Diminished in the bases  Cardiovascular:     Normal rate. Regular rhythm. Normal S1. Normal S2.      Murmurs: There is no murmur.  Edema:    Peripheral edema absent.  Abdominal:     Palpations: Abdomen is soft non tender. There is no hepatomegaly.  Skin:    General: Skin is warm and dry.  Neurological:     General: No focal deficit present.     Mental Status: Alert and oriented to person, place and time.     Cranial Nerves: Cranial nerves are intact.  EKG/LABS/Other Studies Reviewed    ECG personally reviewed by me today - ***  Risk Assessment/Calculations:   {Does this patient have ATRIAL FIBRILLATION?:[phone]}        Lab Results  Component Value Date   WBC 4.5 10/14/2021   HGB 10.7 (L) 10/14/2021   HCT 32.6 (L) 10/14/2021   MCV 90.1 10/14/2021   PLT 212 10/14/2021   Lab Results  Component Value Date   CREATININE 0.87 10/14/2021   BUN 13 10/14/2021   NA 140 10/14/2021   K 3.6 10/14/2021   CL 105 10/14/2021   CO2 31 10/14/2021   Lab Results  Component Value Date   ALT 21 10/14/2021   AST 27 10/14/2021   ALKPHOS 44 10/14/2021   BILITOT 0.6 10/14/2021   Lab Results  Component Value Date   CHOL 203 (H) 06/03/2021   HDL 58 06/03/2021   LDLCALC 127 (H) 06/03/2021   LDLDIRECT 165.0 04/13/2007   TRIG 100 06/03/2021   CHOLHDL 3.5 06/03/2021    Lab Results  Component Value Date   HGBA1C 6.1 (H) 07/23/2007    Assessment & Plan    1.  Nonobstructive CAD/stable angina: -Cardiac CTA completed 05/2021 showing score of 386  94th percentile she had extensive coronary plaque diffusely but no focal stenotic lesion  -Continue GDMT metoprolol 25 mg daily -We will increase her Imdur to 60 mg daily due to increased chest pain -We will also refill her Nitrostat and patient advised to follow-up at the ED for any -We will obtain 2D echo due to shortness of breath to eliminate possible heart structure or valvular changes and to rule out multifactorial.   2.  Essential hypertension: -Patient's blood pressure today was well controlled today at 130/68 -Continue Norvasc 10 mg daily, losartan 100 mg daily, Toprol-XL 25 mg daily   3.  Hyperlipidemia: -Patient's last LDL was 84 -Continue Crestor 20 mg daily   4.  History of pulmonary embolism: -Patient had unprovoked DVT in 2006 and pulmonary embolism in 2011 -No reports of occult bleeding -She is currently on Xarelto 20 mg daily -Dose appropriate based on creatinine clearance of 104 mL/min   5.  COPD: -Followed by pulmonology -Continue current medications as prescribed by pulmonology      Disposition: Follow-up with None or APP in *** months {Are you ordering a CV Procedure (e.g. stress test, cath, DCCV, TEE, etc)?   Press F2        :[id number]}   Medication Adjustments/Labs and Tests Ordered: Current medicines are reviewed at length with the patient today.  Concerns regarding medicines are outlined above.   Signed, Napoleon Form, Leodis Rains, NP 03/26/2022, 1:16 PM [facility]  Note:  This document was prepared using Dragon voice recognition software and may include unintentional dictation errors.

## 2022-03-28 ENCOUNTER — Ambulatory Visit: Payer: 59 | Admitting: Nurse Practitioner

## 2022-03-28 DIAGNOSIS — R072 Precordial pain: Secondary | ICD-10-CM

## 2022-04-11 ENCOUNTER — Other Ambulatory Visit: Payer: Self-pay | Admitting: Cardiology

## 2022-05-12 ENCOUNTER — Other Ambulatory Visit: Payer: Self-pay | Admitting: *Deleted

## 2022-05-12 ENCOUNTER — Other Ambulatory Visit: Payer: Self-pay | Admitting: Physician Assistant

## 2022-05-12 DIAGNOSIS — M79604 Pain in right leg: Secondary | ICD-10-CM

## 2022-05-12 DIAGNOSIS — Z1231 Encounter for screening mammogram for malignant neoplasm of breast: Secondary | ICD-10-CM

## 2022-05-23 ENCOUNTER — Ambulatory Visit (HOSPITAL_COMMUNITY)
Admission: RE | Admit: 2022-05-23 | Discharge: 2022-05-23 | Disposition: A | Discharge status: Home / Self Care | Payer: 59 | Source: Ambulatory Visit | Attending: Surgery | Admitting: Surgery

## 2022-05-23 ENCOUNTER — Ambulatory Visit (INDEPENDENT_AMBULATORY_CARE_PROVIDER_SITE_OTHER): Payer: 59 | Admitting: Physician Assistant

## 2022-05-23 ENCOUNTER — Encounter: Payer: Self-pay | Admitting: Physician Assistant

## 2022-05-23 VITALS — BP 156/84 | HR 80 | Temp 98.2°F | Resp 14 | Ht 64.0 in | Wt 235.0 lb

## 2022-05-23 DIAGNOSIS — I872 Venous insufficiency (chronic) (peripheral): Secondary | ICD-10-CM | POA: Diagnosis not present

## 2022-05-23 DIAGNOSIS — M79604 Pain in right leg: Secondary | ICD-10-CM

## 2022-05-23 DIAGNOSIS — M79605 Pain in left leg: Secondary | ICD-10-CM | POA: Insufficient documentation

## 2022-05-23 DIAGNOSIS — I82532 Chronic embolism and thrombosis of left popliteal vein: Secondary | ICD-10-CM | POA: Diagnosis not present

## 2022-05-23 NOTE — Progress Notes (Signed)
Requested by:  Benito Mccreedy, MD [id number] [place],  [place] [address]  Reason for consultation: swelling BLE L> R    History of Present Illness   [name] is a 67 y.o. (11/17/1955) female who presents for evaluation of bilateral lower extremity swelling, left > right. She has had long history of swelling but feels that her swelling has progressed over past year. She reports history of DVT in 2006, Hx of PE in 2011. She has been on Xarelto since. She says she does elevate which helps and wears some knee high compression socks that she got from [employer]. She reports the compression does help as well. She also explains that she has been having sharp stabbing pains on the outside of both of her legs. This mostly happens upon first waking or when going from sitting to standing position if she has been sitting for some time. She says she has to wait a few moments when she stands up to feel like her legs will hold her before she proceeds with walking. She additionally has numbness in her right thigh however does note history of back injury. 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 Has family history of PE in her mother and DVT in 2 brothers and 1 sister. She says she was evaluated by Hem/ Onc but no clotting disorder identified. She currently smokes 1.5 ppd  Venous symptoms include: swelling, sharp pains Onset/duration:  > 10 years  Occupation:  disabled/retired Aggravating factors: sitting, standing Alleviating factors: elevation, compression Compression:  yes Helps:  yes Pain medications:  takes chronically for joint pain Previous vein procedures:  no History of DVT:  yes  Past Medical History:  Diagnosis Date   COPD (chronic obstructive pulmonary disease) (HCC)    Coronary artery calcification seen on CT scan    Diabetes (HCC)    DVT (deep venous thrombosis) (HCC)    Hyperlipidemia     Hypertension    Observed sleep apnea     Past Surgical History:  Procedure Laterality Date   ABDOMINAL HYSTERECTOMY     KNEE ARTHROSCOPY Bilateral    TOTAL KNEE ARTHROPLASTY Bilateral     Social History   Socioeconomic History   Marital status: Single    Spouse name: Not on file   Number of children: Not on file   Years of education: Not on file   Highest education level: Not on file  Occupational History   Not on file  Tobacco Use   Smoking status: Every Day    Packs/day: 1.50    Years: 55.00    Total pack years: 82.50    Types: Cigarettes    Start date: 06/07/1964    Passive exposure: Current   Smokeless tobacco: Never  Vaping Use   Vaping Use: Never used  Substance and Sexual Activity   Alcohol use: Not Currently   Drug use: Yes    Types: Marijuana, "Crack" cocaine    Comment: None since 1999   Sexual activity: Not on file  Other Topics Concern   Not on file  Social History Narrative   Not on file   Social Determinants of Health   Financial Resource Strain: Not on file  Food Insecurity: Not on file  Transportation Needs: Not on file  Physical Activity: Not on file  Stress: Not on file  Social Connections: Not on file  Intimate Partner Violence: Not on file  Family History  Problem Relation Age of Onset   Pulmonary embolism Mother    Heart Problems Mother    Hypertension Mother    Heart attack Father    Diabetes Father    Pulmonary embolism Sister    Healthy Sister    Pulmonary embolism Brother    Pulmonary embolism Brother     Current Outpatient Medications  Medication Sig Dispense Refill   albuterol (VENTOLIN HFA) 108 (90 Base) MCG/ACT inhaler 2 puffs every 4 (four) hours as needed for wheezing or shortness of breath.     amLODipine (NORVASC) 10 MG tablet amlodipine 10 mg tablet     celecoxib (CELEBREX) 400 MG capsule Take 400 mg by mouth 2 (two) times daily.     cyclobenzaprine (FLEXERIL) 10 MG tablet Take 5-10 mg by mouth 3 (three) times  daily as needed for muscle spasms.     diclofenac Sodium (VOLTAREN) 1 % GEL Apply 1 application topically 4 (four) times daily.     DULoxetine (CYMBALTA) 60 MG capsule Take 120 mg by mouth daily.     esomeprazole (NEXIUM) 40 MG capsule esomeprazole magnesium 40 mg capsule,delayed release  TAKE 1 CAPSULE BY MOUTH EVERY DAY     furosemide (LASIX) 40 MG tablet 40 mg daily as needed for fluid or edema.     hydrALAZINE (APRESOLINE) 25 MG tablet Take 25 mg by mouth daily.     HYDROcodone-acetaminophen (NORCO/VICODIN) 5-325 MG tablet Take 1 tablet by mouth 2 (two) times daily as needed.     Ipratropium-Albuterol (COMBIVENT RESPIMAT) 20-100 MCG/ACT AERS respimat 1 puff every 6 (six) hours as needed for wheezing or shortness of breath.     isosorbide mononitrate (IMDUR) 60 MG 24 hr tablet Take 1 tablet (60 mg total) by mouth daily. 90 tablet 2   lidocaine (LIDODERM) 5 % 1 patch every 12 (twelve) hours.     losartan (COZAAR) 100 MG tablet Take 100 mg by mouth daily.     meclizine (ANTIVERT) 25 MG tablet Take 25 mg by mouth 3 (three) times daily as needed for dizziness.     metoCLOPramide (REGLAN) 10 MG tablet Take 1 tablet (10 mg total) by mouth every 6 (six) hours. 30 tablet 0   metoprolol succinate (TOPROL-XL) 25 MG 24 hr tablet Take 1 tablet (25 mg total) by mouth daily. 30 tablet 1   nortriptyline (PAMELOR) 50 MG capsule nortriptyline 50 mg capsule     ondansetron (ZOFRAN) 4 MG tablet Take 4 mg by mouth every 8 (eight) hours as needed for nausea.     OZEMPIC, 1 MG/DOSE, 4 MG/3ML SOPN Inject 1 mg into the skin once a week.     potassium chloride (KLOR-CON M) 10 MEQ tablet 10 mEq daily as needed (When taking lasix for edema).     rivaroxaban (XARELTO) 20 MG TABS tablet 20 mg daily with supper.     rosuvastatin (CRESTOR) 20 MG tablet Take 20 mg by mouth daily.     Tiotropium Bromide-Olodaterol (STIOLTO RESPIMAT) 2.5-2.5 MCG/ACT AERS INHALE 2 PUFFS BY MOUTH INTO THE LUNGS DAILY 4 g 0   doxycycline  (VIBRAMYCIN) 100 MG capsule Take 1 capsule (100 mg total) by mouth 2 (two) times daily. (Patient not taking: Reported on 05/23/2022) 14 capsule 0   nitroGLYCERIN (NITROSTAT) 0.4 MG SL tablet Place 1 tablet (0.4 mg total) under the tongue every 5 (five) minutes as needed. 30 tablet 3   Vitamin D, Ergocalciferol, (DRISDOL) 1.25 MG (50000 UNIT) CAPS capsule Take 50,000 Units by mouth  once a week. (Patient not taking: Reported on 05/23/2022)     No current facility-administered medications for this visit.    Allergies  Allergen Reactions   Codeine     REACTION: itch   Codeine Phosphate     REACTION: itching   Tramadol-Acetaminophen     REACTION: itch    REVIEW OF SYSTEMS (negative unless checked):   Cardiac:  []  Chest pain or chest pressure? []  Shortness of breath upon activity? []  Shortness of breath when lying flat? []  Irregular heart rhythm?  Vascular:  []  Pain in calf, thigh, or hip brought on by walking? []  Pain in feet at night that wakes you up from your sleep? [x]  Blood clot in your veins? [x]  Leg swelling?  Pulmonary:  []  Oxygen at home? []  Productive cough? []  Wheezing?  Neurologic:  []  Sudden weakness in arms or legs? []  Sudden numbness in arms or legs? []  Sudden onset of difficult speaking or slurred speech? []  Temporary loss of vision in one eye? []  Problems with dizziness?  Gastrointestinal:  []  Blood in stool? []  Vomited blood?  Genitourinary:  []  Burning when urinating? []  Blood in urine?  Psychiatric:  []  Major depression  Hematologic:  []  Bleeding problems? []  Problems with blood clotting?  Dermatologic:  []  Rashes or ulcers?  Constitutional:  []  Fever or chills?  Ear/Nose/Throat:  []  Change in hearing? []  Nose bleeds? []  Sore throat?  Musculoskeletal:  []  Back pain? []  Joint pain? []  Muscle pain?   Physical Examination     Vitals:   05/23/22 1457  BP: (!) 156/84  Pulse: 80  Resp: 14  Temp: 98.2 F (36.8 C)  TempSrc:  Temporal  SpO2: 98%  Weight: 235 lb (106.6 kg)  Height: 5\' 4"  (1.626 m)   Body mass index is 40.34 kg/m.  General:  WDWN in NAD; vital signs documented above Gait: uses cane HENT: WNL, normocephalic Pulmonary: normal non-labored breathing , without  wheezing Cardiac: regular HR Abdomen: soft Vascular Exam/Pulses:  Right Left  Radial 2+ (normal) 2+ (normal)  Femoral 2+ (normal) 2+ (normal)  Popliteal 2+ (normal) 2+ (normal)  DP 1+ (weak) 1+ (weak)  PT 1+ (weak) 1+ (weak)   Extremities: without varicose veins, with reticular veins, with edema, without stasis pigmentation, without lipodermatosclerosis, without ulcers Musculoskeletal: no muscle wasting or atrophy  Neurologic: A&O X 3;  No focal weakness or paresthesias are detected Psychiatric:  The pt has Normal affect.  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 female who presents with: BLE chronic venous insufficiency with swelling. She has history of DVT/ PE as well as extensive family history of DVT. She is on chronic anticoagulation with Xarelto. Her duplex today shows chronic non occlusive left popliteal DVT, no SVT. No acute DVT. GSV is incompetent at [facility] and proximal thigh. No SSV reflux. She does have deep reflux. Based on her duplex today and history of DVT she would not be a good candidate for venous lazer ablation.  Based on the patient's history and examination, I recommend: daily elevation above level of heart, knee high compression stockings, exercise, weight reduction, refraining from prolonged sitting or standing. Recommend knee high 15-20 mmHg compression stockings. She was measured for these today and purchased a pair  at today's office visit She can follow up as  needed if she has new or worsening symptoms    Graceann Congress, PA-C [facility] Office: [phone]  05/23/2022, 3:31 PM  Clinic MD: Myra Gianotti

## 2022-05-24 ENCOUNTER — Ambulatory Visit: Payer: 59 | Admitting: Podiatry

## 2022-06-09 ENCOUNTER — Telehealth: Payer: Self-pay | Admitting: Gastroenterology

## 2022-06-09 NOTE — Telephone Encounter (Signed)
Hi Dr. Loletha Carrow,  Supervising provider: 06/09/22   We received a referral for patient to be evaluated for constipation and intermittent diarrhea. She does have GI history with [facility] she states they have not been able to find the cause for her symptoms. She is requesting a transfer of care over to [facility]. Her records were obtained for you to review and advise on scheduling.   Thank you

## 2022-06-10 NOTE — Telephone Encounter (Signed)
Request received to transfer GI care from outside practice to [facility].  We appreciate the interest in our practice, however due to high demand from patients without established GI providers, we cannot accommodate this transfer.    - H. Danis

## 2022-06-13 ENCOUNTER — Telehealth: Payer: Self-pay | Admitting: Internal Medicine

## 2022-06-13 NOTE — Telephone Encounter (Signed)
Error

## 2022-06-13 NOTE — Telephone Encounter (Deleted)
Hi Dr. Hilarie Fredrickson,  Supervising Provider:   We received a referral for patient to be evaluated for constipation and intermittent diarrhea. She does have GI history with [facility], requesting a transfer of care because she is not happy with their patient care. Stated she knew someone that had a procedure and they did not follow up and she said the patient passed away due to negligence. Records have been obtained for you to review and advise on scheduling.   Thank you

## 2022-06-20 NOTE — Telephone Encounter (Signed)
Patient was advised and understood

## 2022-07-04 ENCOUNTER — Ambulatory Visit
Admission: RE | Admit: 2022-07-04 | Discharge: 2022-07-04 | Disposition: A | Discharge status: Home / Self Care | Payer: 59 | Source: Ambulatory Visit | Attending: Physician Assistant | Admitting: Physician Assistant

## 2022-07-04 DIAGNOSIS — Z1231 Encounter for screening mammogram for malignant neoplasm of breast: Secondary | ICD-10-CM

## 2022-07-07 ENCOUNTER — Other Ambulatory Visit: Payer: Self-pay | Admitting: Physician Assistant

## 2022-07-07 DIAGNOSIS — R928 Other abnormal and inconclusive findings on diagnostic imaging of breast: Secondary | ICD-10-CM

## 2022-08-15 ENCOUNTER — Other Ambulatory Visit: Payer: Self-pay | Admitting: Physician Assistant

## 2022-08-15 ENCOUNTER — Ambulatory Visit
Admission: RE | Admit: 2022-08-15 | Discharge: 2022-08-15 | Disposition: A | Discharge status: Home / Self Care | Payer: 59 | Source: Ambulatory Visit | Attending: Physician Assistant | Admitting: Physician Assistant

## 2022-08-15 DIAGNOSIS — R928 Other abnormal and inconclusive findings on diagnostic imaging of breast: Secondary | ICD-10-CM

## 2022-08-15 DIAGNOSIS — R921 Mammographic calcification found on diagnostic imaging of breast: Secondary | ICD-10-CM

## 2022-08-23 ENCOUNTER — Ambulatory Visit
Admission: RE | Admit: 2022-08-23 | Discharge: 2022-08-23 | Disposition: A | Discharge status: Home / Self Care | Payer: 59 | Source: Ambulatory Visit | Attending: Physician Assistant | Admitting: Physician Assistant

## 2022-08-23 DIAGNOSIS — C50919 Malignant neoplasm of unspecified site of unspecified female breast: Secondary | ICD-10-CM

## 2022-08-23 DIAGNOSIS — R928 Other abnormal and inconclusive findings on diagnostic imaging of breast: Secondary | ICD-10-CM

## 2022-08-23 DIAGNOSIS — R921 Mammographic calcification found on diagnostic imaging of breast: Secondary | ICD-10-CM

## 2022-08-23 HISTORY — DX: Malignant neoplasm of unspecified site of unspecified female breast: C50.919

## 2022-08-23 HISTORY — PX: BREAST BIOPSY: SHX20

## 2022-08-25 ENCOUNTER — Telehealth: Payer: Self-pay | Admitting: Hematology and Oncology

## 2022-08-25 NOTE — Telephone Encounter (Signed)
Spoke to patient to confirm upcoming morning [facility] clinic appointment on 4/24, paperwork will be sent via mail.   Gave location and time, also informed patient that the surgeon's office would be calling as well to get information from them similar to the packet that they will be receiving so make sure to do both.  Reminded patient that all providers will be coming to the clinic to see them HERE and if they had any questions to not hesitate to reach back out to myself or their navigators.

## 2022-08-29 ENCOUNTER — Encounter: Payer: Self-pay | Admitting: *Deleted

## 2022-08-29 DIAGNOSIS — D0512 Intraductal carcinoma in situ of left breast: Secondary | ICD-10-CM

## 2022-08-30 ENCOUNTER — Telehealth: Payer: Self-pay | Admitting: Hematology and Oncology

## 2022-08-30 NOTE — Telephone Encounter (Signed)
Left 2 messages for patient to return my call in reference to [facility] appointment, we are trying to cancel and send her straight to a surgeon, patient has not returned either of my calls.

## 2022-08-31 ENCOUNTER — Encounter: Payer: Self-pay | Admitting: Genetic Counselor

## 2022-08-31 ENCOUNTER — Ambulatory Visit: Payer: 59 | Admitting: Physical Therapy

## 2022-08-31 ENCOUNTER — Inpatient Hospital Stay: Payer: 59 | Admitting: Hematology and Oncology

## 2022-08-31 ENCOUNTER — Other Ambulatory Visit: Payer: Self-pay | Admitting: *Deleted

## 2022-08-31 ENCOUNTER — Encounter: Payer: Self-pay | Admitting: *Deleted

## 2022-08-31 ENCOUNTER — Telehealth: Payer: Self-pay | Admitting: *Deleted

## 2022-08-31 ENCOUNTER — Inpatient Hospital Stay: Payer: 59

## 2022-08-31 ENCOUNTER — Inpatient Hospital Stay: Payer: 59 | Attending: Hematology and Oncology | Admitting: Hematology and Oncology

## 2022-08-31 ENCOUNTER — Telehealth: Payer: Self-pay | Admitting: Radiation Oncology

## 2022-08-31 ENCOUNTER — Inpatient Hospital Stay (HOSPITAL_BASED_OUTPATIENT_CLINIC_OR_DEPARTMENT_OTHER): Payer: 59 | Admitting: Genetic Counselor

## 2022-08-31 ENCOUNTER — Ambulatory Visit: Payer: 59 | Admitting: Radiation Oncology

## 2022-08-31 VITALS — BP 150/73 | HR 72 | Temp 98.1°F | Resp 16 | Ht 64.0 in | Wt 230.6 lb

## 2022-08-31 DIAGNOSIS — Z90710 Acquired absence of both cervix and uterus: Secondary | ICD-10-CM | POA: Diagnosis not present

## 2022-08-31 DIAGNOSIS — D0512 Intraductal carcinoma in situ of left breast: Secondary | ICD-10-CM | POA: Insufficient documentation

## 2022-08-31 DIAGNOSIS — F1490 Cocaine use, unspecified, uncomplicated: Secondary | ICD-10-CM | POA: Diagnosis not present

## 2022-08-31 DIAGNOSIS — Z79899 Other long term (current) drug therapy: Secondary | ICD-10-CM | POA: Diagnosis not present

## 2022-08-31 DIAGNOSIS — E119 Type 2 diabetes mellitus without complications: Secondary | ICD-10-CM | POA: Diagnosis not present

## 2022-08-31 DIAGNOSIS — J449 Chronic obstructive pulmonary disease, unspecified: Secondary | ICD-10-CM | POA: Insufficient documentation

## 2022-08-31 DIAGNOSIS — I2510 Atherosclerotic heart disease of native coronary artery without angina pectoris: Secondary | ICD-10-CM | POA: Insufficient documentation

## 2022-08-31 DIAGNOSIS — F17210 Nicotine dependence, cigarettes, uncomplicated: Secondary | ICD-10-CM | POA: Diagnosis not present

## 2022-08-31 DIAGNOSIS — M549 Dorsalgia, unspecified: Secondary | ICD-10-CM | POA: Diagnosis not present

## 2022-08-31 DIAGNOSIS — Z825 Family history of asthma and other chronic lower respiratory diseases: Secondary | ICD-10-CM | POA: Insufficient documentation

## 2022-08-31 DIAGNOSIS — R9230 Dense breasts, unspecified: Secondary | ICD-10-CM | POA: Insufficient documentation

## 2022-08-31 DIAGNOSIS — Z86711 Personal history of pulmonary embolism: Secondary | ICD-10-CM | POA: Diagnosis not present

## 2022-08-31 DIAGNOSIS — F1290 Cannabis use, unspecified, uncomplicated: Secondary | ICD-10-CM | POA: Diagnosis not present

## 2022-08-31 DIAGNOSIS — Z8249 Family history of ischemic heart disease and other diseases of the circulatory system: Secondary | ICD-10-CM | POA: Diagnosis not present

## 2022-08-31 DIAGNOSIS — Z7901 Long term (current) use of anticoagulants: Secondary | ICD-10-CM | POA: Diagnosis not present

## 2022-08-31 DIAGNOSIS — Z885 Allergy status to narcotic agent status: Secondary | ICD-10-CM | POA: Insufficient documentation

## 2022-08-31 DIAGNOSIS — Z8041 Family history of malignant neoplasm of ovary: Secondary | ICD-10-CM | POA: Insufficient documentation

## 2022-08-31 DIAGNOSIS — Z8042 Family history of malignant neoplasm of prostate: Secondary | ICD-10-CM

## 2022-08-31 DIAGNOSIS — Z8051 Family history of malignant neoplasm of kidney: Secondary | ICD-10-CM

## 2022-08-31 DIAGNOSIS — Z86718 Personal history of other venous thrombosis and embolism: Secondary | ICD-10-CM | POA: Diagnosis not present

## 2022-08-31 LAB — CMP (CANCER CENTER ONLY)
ALT: 31 U/L (ref 0–44)
AST: 35 U/L (ref 15–41)
Albumin: 4 g/dL (ref 3.5–5.0)
Alkaline Phosphatase: 41 U/L (ref 38–126)
Anion gap: 3 — ABNORMAL LOW (ref 5–15)
BUN: 18 mg/dL (ref 8–23)
CO2: 31 mmol/L (ref 22–32)
Calcium: 9.7 mg/dL (ref 8.9–10.3)
Chloride: 110 mmol/L (ref 98–111)
Creatinine: 0.97 mg/dL (ref 0.44–1.00)
GFR, Estimated: >60 mL/min (ref >60)
Glucose, Bld: 85 mg/dL (ref 70–99)
Potassium: 4 mmol/L (ref 3.5–5.1)
Sodium: 144 mmol/L (ref 135–145)
Total Bilirubin: 0.3 mg/dL (ref 0.3–1.2)
Total Protein: 7 g/dL (ref 6.5–8.1)

## 2022-08-31 LAB — CBC WITH DIFFERENTIAL (CANCER CENTER ONLY)
Abs Immature Granulocytes: 0 10*3/uL (ref 0.00–0.07)
Basophils Absolute: 0 10*3/uL (ref 0.0–0.1)
Basophils Relative: 1 %
Eosinophils Absolute: 0.1 10*3/uL (ref 0.0–0.5)
Eosinophils Relative: 3 %
HCT: 38.4 % (ref 36.0–46.0)
Hemoglobin: 12.4 g/dL (ref 12.0–15.0)
Immature Granulocytes: 0 %
Lymphocytes Relative: 31 %
Lymphs Abs: 1.2 10*3/uL (ref 0.7–4.0)
MCH: 30.1 pg (ref 26.0–34.0)
MCHC: 32.3 g/dL (ref 30.0–36.0)
MCV: 93.2 fL (ref 80.0–100.0)
Monocytes Absolute: 0.3 10*3/uL (ref 0.1–1.0)
Monocytes Relative: 6 %
Neutro Abs: 2.3 10*3/uL (ref 1.7–7.7)
Neutrophils Relative %: 59 %
Platelet Count: 208 10*3/uL (ref 150–400)
RBC: 4.12 MIL/uL (ref 3.87–5.11)
RDW: 14.2 % (ref 11.5–15.5)
WBC Count: 4 10*3/uL (ref 4.0–10.5)
nRBC: 0 % (ref 0.0–0.2)

## 2022-08-31 LAB — GENETIC SCREENING ORDER

## 2022-08-31 NOTE — Assessment & Plan Note (Signed)
This is a very pleasant 67 year old postmenopausal female patient with newly diagnosed left breast DCIS, ER/PR positive originally scheduled for breast MDC however was canceled but showed up with her family since she was not aware of the cancellation. We have discussed the following details about DCIS and management.  Pathology review: I discussed with the patient the difference between DCIS and invasive breast cancer. It is considered a precancerous lesion. DCIS is classified as a Stage 0 breast cancer. It is generally detected through mammograms as calcifications. We discussed the significance of grades and its impact on prognosis. We also discussed the importance of ER and PR receptors and their implications to adjuvant treatment options. Prognosis of DCIS dependence on grade and degree of comedo necrosis. It is anticipated that if not treated, 20-30% of DCIS can develop into invasive breast cancer.  Recommendation: 1. Breast conserving surgery 2. Followed by adjuvant radiation therapy 3. Followed by antiestrogen therapy with tamoxifen/aromatase inhibitors based on menopausal status 5 years  Tamoxifen counseling: We discussed the risks and benefits of tamoxifen. These include but not limited to insomnia, hot flashes, mood changes, vaginal dryness, and weight gain. Although rare, serious side effects including endometrial cancer, risk of blood clots were also discussed. We strongly believe that the benefits far outweigh the risks. Patient understands these risks and consented to starting treatment. Planned treatment duration is 5 years.  Aromatase inhibitors counseling: We have discussed the mechanism of action of aromatase inhibitors today.  We have discussed adverse effects including but not limited to menopausal symptoms, increased risk of osteoporosis and fractures, cardiovascular events, arthralgias and myalgias.  We do believe that the benefits far outweigh the risks.  Plan treatment duration of  5 years.  Given her history of blood clots, we have agreed that aromatase inhibitors might be the right choice for antiestrogen therapy.  She will return to clinic after radiation to initiate antiestrogen therapy.  Genetic testing indicated given significant family history including ovarian cancer in maternal aunt.  She is agreeable to genetic testing.

## 2022-08-31 NOTE — Telephone Encounter (Signed)
Left message for a return phone call to follow up from new patient appt. And assess navigation needs. Left contact information.

## 2022-08-31 NOTE — Progress Notes (Signed)
[facility] CONSULT NOTE  Patient Care Team: Jackie Plum, MD as PCP - General (Internal Medicine) Dulce Sellar Iline Oven, MD as Consulting Physician (Cardiology) Donnelly Angelica, RN as Oncology Nurse Navigator Pershing Proud, RN as Oncology Nurse Navigator Harriette Bouillon, MD as Consulting Physician (General Surgery) Dorothy Puffer, MD as Consulting Physician (Radiation Oncology) Rachel Moulds, MD as Consulting Physician (Hematology and Oncology)  CHIEF COMPLAINTS/PURPOSE OF CONSULTATION:  DCIS  ASSESSMENT & PLAN:  Ductal carcinoma in situ (DCIS) of left breast This is a very pleasant 67 year old postmenopausal female patient with newly diagnosed left breast DCIS, ER/PR positive originally scheduled for breast MDC however was canceled but showed up with her family since she was not aware of the cancellation. We have discussed the following details about DCIS and management.  Pathology review: I discussed with the patient the difference between DCIS and invasive breast cancer. It is considered a precancerous lesion. DCIS is classified as a Stage 0 breast cancer. It is generally detected through mammograms as calcifications. We discussed the significance of grades and its impact on prognosis. We also discussed the importance of ER and PR receptors and their implications to adjuvant treatment options. Prognosis of DCIS dependence on grade and degree of comedo necrosis. It is anticipated that if not treated, 20-30% of DCIS can develop into invasive breast cancer.  Recommendation: 1. Breast conserving surgery 2. Followed by adjuvant radiation therapy 3. Followed by antiestrogen therapy with tamoxifen/aromatase inhibitors based on menopausal status 5 years  Tamoxifen counseling: We discussed the risks and benefits of tamoxifen. These include but not limited to insomnia, hot flashes, mood changes, vaginal dryness, and weight gain. Although rare, serious side effects including  endometrial cancer, risk of blood clots were also discussed. We strongly believe that the benefits far outweigh the risks. Patient understands these risks and consented to starting treatment. Planned treatment duration is 5 years.  Aromatase inhibitors counseling: We have discussed the mechanism of action of aromatase inhibitors today.  We have discussed adverse effects including but not limited to menopausal symptoms, increased risk of osteoporosis and fractures, cardiovascular events, arthralgias and myalgias.  We do believe that the benefits far outweigh the risks.  Plan treatment duration of 5 years.  Given her history of blood clots, we have agreed that aromatase inhibitors might be the right choice for antiestrogen therapy.  She will return to clinic after radiation to initiate antiestrogen therapy.  Genetic testing indicated given significant family history including ovarian cancer in maternal aunt.  She is agreeable to genetic testing.  No orders of the defined types were placed in this encounter.    HISTORY OF PRESENTING ILLNESS:  [name] 67 y.o. female is here because of DCIS  Oncology History  Ductal carcinoma in situ (DCIS) of left breast  07/04/2022 Mammogram   Screening mammogram suggested further evaluation for calcifications in the left breast.  She then had a diagnostic mammogram which showed 4.8 cm span of indeterminate calcifications in the left breast warranting tissue diagnosis.   08/23/2022 Pathology Results   Left breast needle core biopsy showed grade 3 DCIS, necrosis and calcifications present, prognostic showed ER 100% positive strong staining PR 60% positive moderate to strong staining   08/29/2022 Initial Diagnosis   Ductal carcinoma in situ (DCIS) of left breast    She arrived today to the appointment with her 2 sisters and her son.  She was not aware that the breast MDC was canceled apparently.  She was a bit upset  about that.  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  No history of breast cancer in the family however several other cancers run in the family.  Maternal aunt had ovarian cancer, sister had renal cancer, dad had metastatic carcinoma of unknown primary.  She has 2 children, 2 boys.  No history of hormone replacement therapy.  She complains of occasional back pain for which she takes prednisone, otherwise significant history of DVT for which she stayed on Coumadin and PE subsequently hence she was recommended indefinite anticoagulation.  There is also strong family history of blood clots.  Sister had saddle pulmonary embolism.  Rest of the pertinent 10 point ROS reviewed and negative  MEDICAL HISTORY:  Past Medical History:  Diagnosis Date   COPD (chronic obstructive pulmonary disease)    Coronary artery calcification seen on CT scan    Diabetes    DVT (deep venous thrombosis)    Hyperlipidemia    Hypertension    Observed sleep apnea     SURGICAL HISTORY: Past Surgical History:  Procedure Laterality Date   ABDOMINAL HYSTERECTOMY     BREAST BIOPSY Left 08/23/2022   MM LT BREAST BX W LOC DEV 1ST LESION IMAGE BX SPEC STEREO GUIDE 08/23/2022 [facility]   BREAST BIOPSY Left 08/23/2022   MM LT BREAST BX W LOC DEV EA AD LESION IMG BX SPEC STEREO GUIDE 08/23/2022 [facility]   KNEE ARTHROSCOPY Bilateral    TOTAL KNEE ARTHROPLASTY Bilateral     SOCIAL HISTORY: Social History   Socioeconomic History   Marital status: Single    Spouse name: Not on file   Number of children: Not on file   Years of education: Not on file   Highest education level: Not on file  Occupational History   Not on file  Tobacco Use   Smoking status: Every Day    Packs/day: 1.50    Years: 55.00    Additional pack years: 0.00    Total pack years: 82.50    Types: Cigarettes    Start date: 06/07/1964    Passive  exposure: Current   Smokeless tobacco: Never  Vaping Use   Vaping Use: Never used  Substance and Sexual Activity   Alcohol use: Not Currently   Drug use: Yes    Types: Marijuana, "Crack" cocaine    Comment: None since 1999   Sexual activity: Not on file  Other Topics Concern   Not on file  Social History Narrative   Not on file   Social Determinants of Health   Financial Resource Strain: Not on file  Food Insecurity: Not on file  Transportation Needs: Not on file  Physical Activity: Not on file  Stress: Not on file  Social Connections: Not on file  Intimate Partner Violence: Not on file    FAMILY HISTORY: Family History  Problem Relation Age of Onset   Pulmonary embolism Mother    Heart Problems Mother    Hypertension Mother    Heart attack Father    Diabetes Father    Pulmonary embolism Sister    Healthy Sister    Pulmonary embolism Brother    Pulmonary embolism Brother     ALLERGIES:  is allergic to codeine, codeine phosphate, and tramadol-acetaminophen.  MEDICATIONS:  Current Outpatient Medications  Medication Sig Dispense Refill   albuterol (VENTOLIN HFA) 108 (90 Base) MCG/ACT  inhaler 2 puffs every 4 (four) hours as needed for wheezing or shortness of breath.     amLODipine (NORVASC) 10 MG tablet amlodipine 10 mg tablet     celecoxib (CELEBREX) 400 MG capsule Take 400 mg by mouth 2 (two) times daily.     cyclobenzaprine (FLEXERIL) 10 MG tablet Take 5-10 mg by mouth 3 (three) times daily as needed for muscle spasms.     diclofenac Sodium (VOLTAREN) 1 % GEL Apply 1 application topically 4 (four) times daily.     doxycycline (VIBRAMYCIN) 100 MG capsule Take 1 capsule (100 mg total) by mouth 2 (two) times daily. (Patient not taking: Reported on 05/23/2022) 14 capsule 0   DULoxetine (CYMBALTA) 60 MG capsule Take 120 mg by mouth daily.     esomeprazole (NEXIUM) 40 MG capsule esomeprazole magnesium 40 mg capsule,delayed release  TAKE 1 CAPSULE BY MOUTH EVERY DAY      furosemide (LASIX) 40 MG tablet 40 mg daily as needed for fluid or edema.     hydrALAZINE (APRESOLINE) 25 MG tablet Take 25 mg by mouth daily.     HYDROcodone-acetaminophen (NORCO/VICODIN) 5-325 MG tablet Take 1 tablet by mouth 2 (two) times daily as needed.     Ipratropium-Albuterol (COMBIVENT RESPIMAT) 20-100 MCG/ACT AERS respimat 1 puff every 6 (six) hours as needed for wheezing or shortness of breath.     isosorbide mononitrate (IMDUR) 60 MG 24 hr tablet Take 1 tablet (60 mg total) by mouth daily. 90 tablet 2   lidocaine (LIDODERM) 5 % 1 patch every 12 (twelve) hours.     losartan (COZAAR) 100 MG tablet Take 100 mg by mouth daily.     meclizine (ANTIVERT) 25 MG tablet Take 25 mg by mouth 3 (three) times daily as needed for dizziness.     metoCLOPramide (REGLAN) 10 MG tablet Take 1 tablet (10 mg total) by mouth every 6 (six) hours. 30 tablet 0   metoprolol succinate (TOPROL-XL) 25 MG 24 hr tablet Take 1 tablet (25 mg total) by mouth daily. 30 tablet 1   nitroGLYCERIN (NITROSTAT) 0.4 MG SL tablet Place 1 tablet (0.4 mg total) under the tongue every 5 (five) minutes as needed. 30 tablet 3   nortriptyline (PAMELOR) 50 MG capsule nortriptyline 50 mg capsule     ondansetron (ZOFRAN) 4 MG tablet Take 4 mg by mouth every 8 (eight) hours as needed for nausea.     OZEMPIC, 1 MG/DOSE, 4 MG/3ML SOPN Inject 1 mg into the skin once a week.     potassium chloride (KLOR-CON M) 10 MEQ tablet 10 mEq daily as needed (When taking lasix for edema).     rivaroxaban (XARELTO) 20 MG TABS tablet 20 mg daily with supper.     rosuvastatin (CRESTOR) 20 MG tablet Take 20 mg by mouth daily.     Tiotropium Bromide-Olodaterol (STIOLTO RESPIMAT) 2.5-2.5 MCG/ACT AERS INHALE 2 PUFFS BY MOUTH INTO THE LUNGS DAILY 4 g 0   Vitamin D, Ergocalciferol, (DRISDOL) 1.25 MG (50000 UNIT) CAPS capsule Take 50,000 Units by mouth once a week. (Patient not taking: Reported on 05/23/2022)     No current facility-administered medications for  this visit.     PHYSICAL EXAMINATION: ECOG PERFORMANCE STATUS: 0 - Asymptomatic  Vitals:   08/31/22 0907  BP: (!) 150/73  Pulse: 72  Resp: 16  Temp: 98.1 F (36.7 C)  SpO2: 100%   Filed Weights   08/31/22 0907  Weight: 230 lb 9.6 oz (104.6 kg)    GENERAL:alert, no distress  and comfortable, walks with a cane he has Neck: No cervical adenopathy. Chest: Bilateral breasts inspected and palpated.  Left breast with large hematoma and dependent ecchymosis likely because she is on concomitant anticoagulation.  No axillary adenopathy  LABORATORY DATA:  I have reviewed the data as listed Lab Results  Component Value Date   WBC 4.5 10/14/2021   HGB 10.7 (L) 10/14/2021   HCT 32.6 (L) 10/14/2021   MCV 90.1 10/14/2021   PLT 212 10/14/2021     Chemistry      Component Value Date/Time   NA 140 10/14/2021 2029   NA 144 06/03/2021 0939   K 3.6 10/14/2021 2029   CL 105 10/14/2021 2029   CO2 31 10/14/2021 2029   BUN 13 10/14/2021 2029   BUN 12 06/03/2021 0939   CREATININE 0.87 10/14/2021 2029      Component Value Date/Time   CALCIUM 9.0 10/14/2021 2029   ALKPHOS 44 10/14/2021 2029   AST 27 10/14/2021 2029   ALT 21 10/14/2021 2029   BILITOT 0.6 10/14/2021 2029   BILITOT 0.2 06/03/2021 0939       RADIOGRAPHIC STUDIES: I have personally reviewed the radiological images as listed and agreed with the findings in the report. MM LT BREAST BX W LOC DEV 1ST LESION IMAGE BX SPEC STEREO GUIDE  Addendum Date: 08/25/2022   ADDENDUM REPORT: 08/25/2022 21:13 ADDENDUM: Pathology revealed 1. Breast, left, needle core biopsy, lateral posterior, (coil clip) DUCTAL CARCINOMA IN SITU, CRIBRIFORM TYPE, GRADE 3, NECROSIS: PRESENT- CALCIFICATIONS: PRESENT. This was found to be concordant by Dr. Gerome Sam. Pathology revealed 2. Breast, left, needle core biopsy, lateral anterior, (ribbon clip) DUCTAL CARCINOMA IN SITU, CRIBRIFORM AND SOLID TYPES, GRADE 3- NECROSIS: PRESENT-CALCIFICATIONS:  PRESENT. This was found to be concordant by Dr. Gerome Sam. Pathology results were discussed with the patient by telephone. The patient reported doing well after the biopsies with tenderness at the sites. Post biopsy instructions and care were reviewed and questions were answered. The patient was encouraged to call [facility] for any additional concerns. The patient was referred to [facility] on August 31, 2022. Consider breast MRI given high grade histology. Pathology results reported by Collene Mares RN on 08/25/2022. Electronically Signed   By: Gerome Sam III M.D.   On: 08/25/2022 21:13   Result Date: 08/25/2022 CLINICAL DATA:  The patient presents for biopsy of calcifications in the lateral left breast. Both the anterior and posterior aspects of the calcifications will be biopsied. EXAM: LEFT BREAST STEREOTACTIC CORE NEEDLE BIOPSY COMPARISON:  Previous exam(s). FINDINGS: The patient and I discussed the procedure of stereotactic-guided biopsy including benefits and alternatives. We discussed the high likelihood of a successful procedure. We discussed the risks of the procedure including infection, bleeding, tissue injury, clip migration, and inadequate sampling. Informed written consent was given. The usual time out protocol was performed immediately prior to the procedure. Using sterile technique and 1% Lidocaine as local anesthetic, under stereotactic guidance, a 9 gauge vacuum assisted device was used to perform core needle biopsy of the posterior aspect of the calcifications in the lateral left breast using a superior approach. Specimen radiograph was performed showing calcifications in several core specimens. Specimens with calcifications are identified for pathology. Lesion quadrant: Lateral posterior At the conclusion of the procedure, a coil shaped tissue marker clip was deployed into the  biopsy cavity. Follow-up 2-view mammogram was performed and dictated separately. Using sterile  technique and 1% Lidocaine as local anesthetic, under stereotactic guidance, a 9 gauge vacuum assisted device was used to perform core needle biopsy of the anterior aspect of the calcifications in the lateral left breast using a lateral approach. Specimen radiograph was performed showing calcifications in several core specimens. Specimens with calcifications are identified for pathology. Lesion quadrant: Lateral anterior At the conclusion of the procedure, a ribbon shaped tissue marker clip was deployed into the biopsy cavity. Follow-up 2-view mammogram was performed and dictated separately. IMPRESSION: Stereotactic-guided biopsy of left breast calcifications as above. No apparent complications. Electronically Signed: By: Gerome Sam III M.D. On: 08/23/2022 10:52  MM LT BREAST BX W LOC DEV EA AD LESION IMG BX SPEC STEREO GUIDE  Addendum Date: 08/25/2022   ADDENDUM REPORT: 08/25/2022 21:13 ADDENDUM: Pathology revealed 1. Breast, left, needle core biopsy, lateral posterior, (coil clip) DUCTAL CARCINOMA IN SITU, CRIBRIFORM TYPE, GRADE 3, NECROSIS: PRESENT- CALCIFICATIONS: PRESENT. This was found to be concordant by Dr. Gerome Sam. Pathology revealed 2. Breast, left, needle core biopsy, lateral anterior, (ribbon clip) DUCTAL CARCINOMA IN SITU, CRIBRIFORM AND SOLID TYPES, GRADE 3- NECROSIS: PRESENT-CALCIFICATIONS: PRESENT. This was found to be concordant by Dr. Gerome Sam. Pathology results were discussed with the patient by telephone. The patient reported doing well after the biopsies with tenderness at the sites. Post biopsy instructions and care were reviewed and questions were answered. The patient was encouraged to call [facility] for any additional concerns. The patient was referred to [facility] on  August 31, 2022. Consider breast MRI given high grade histology. Pathology results reported by Collene Mares RN on 08/25/2022. Electronically Signed   By: Gerome Sam III M.D.   On: 08/25/2022 21:13   Result Date: 08/25/2022 CLINICAL DATA:  The patient presents for biopsy of calcifications in the lateral left breast. Both the anterior and posterior aspects of the calcifications will be biopsied. EXAM: LEFT BREAST STEREOTACTIC CORE NEEDLE BIOPSY COMPARISON:  Previous exam(s). FINDINGS: The patient and I discussed the procedure of stereotactic-guided biopsy including benefits and alternatives. We discussed the high likelihood of a successful procedure. We discussed the risks of the procedure including infection, bleeding, tissue injury, clip migration, and inadequate sampling. Informed written consent was given. The usual time out protocol was performed immediately prior to the procedure. Using sterile technique and 1% Lidocaine as local anesthetic, under stereotactic guidance, a 9 gauge vacuum assisted device was used to perform core needle biopsy of the posterior aspect of the calcifications in the lateral left breast using a superior approach. Specimen radiograph was performed showing calcifications in several core specimens. Specimens with calcifications are identified for pathology. Lesion quadrant: Lateral posterior At the conclusion of the procedure, a coil shaped tissue marker clip was deployed into the biopsy cavity. Follow-up 2-view mammogram was performed and dictated separately. Using sterile technique and 1% Lidocaine as local anesthetic, under stereotactic guidance, a 9 gauge vacuum assisted device was used to perform core needle biopsy of the anterior aspect of the calcifications in the lateral left breast using a lateral approach. Specimen radiograph was performed showing calcifications in several core specimens. Specimens with calcifications are identified for pathology. Lesion quadrant: Lateral  anterior At the conclusion of the procedure, a ribbon shaped tissue marker clip was deployed into the biopsy cavity. Follow-up 2-view mammogram was performed and dictated separately. IMPRESSION: Stereotactic-guided biopsy of left breast calcifications as above. No apparent complications. Electronically Signed: By:  Gerome Sam III M.D. On: 08/23/2022 10:52  MM CLIP PLACEMENT LEFT  Result Date: 08/23/2022 CLINICAL DATA:  Evaluate biopsy marker EXAM: 3D DIAGNOSTIC LEFT MAMMOGRAM POST STEREOTACTIC BIOPSY COMPARISON:  Previous exam(s). FINDINGS: 3D Mammographic images were obtained following stereotactic guided biopsy of left breast calcifications. The coil shaped clip is at the site of the lateral posterior biopsied calcifications. The ribbon shaped clip migrated 1.8 cm medial to the lateral anterior biopsied calcifications. IMPRESSION: The coil shaped clip is at the site of the lateral posterior biopsied calcifications. The ribbon shaped clip migrated 1.8 cm medial to the lateral anterior biopsied calcifications. Final Assessment: Post Procedure Mammograms for Marker Placement Electronically Signed   By: Gerome Sam III M.D.   On: 08/23/2022 10:55  MM Digital Diagnostic Unilat L  Result Date: 08/15/2022 CLINICAL DATA:  Patient returns after screening study for evaluation of LEFT breast calcifications. EXAM: DIGITAL DIAGNOSTIC UNILATERAL LEFT MAMMOGRAM WITH CAD TECHNIQUE: Left digital diagnostic mammography was performed. COMPARISON:  Previous exam(s). ACR Breast Density Category b: There are scattered areas of fibroglandular density. FINDINGS: Magnified views are performed of calcifications in the central and LATERAL anterior aspect of the LEFT breast. These views demonstrate faint pleomorphic calcifications in a segmental distribution spanning at least 4.8 x 1.7 x 4.2 centimeters. Calcifications extend towards the nipple, and likely extend to just below the skin. IMPRESSION: 4.8 centimeters span of  indeterminate calcifications in the LEFT breast warranting tissue diagnosis. RECOMMENDATION: Two-site stereotactic biopsy of LEFT breast calcifications, targeting anterior and posterior extent, if impossible. I have discussed the findings and recommendations with the patient. If applicable, a reminder letter will be sent to the patient regarding the next appointment. BI-RADS CATEGORY  4: Suspicious. Electronically Signed   By: Norva Pavlov M.D.   On: 08/15/2022 16:14   All questions were answered. The patient knows to call the clinic with any problems, questions or concerns. I spent 45 minutes in the care of this patient including H and P, review of records, counseling and coordination of care.     Rachel Moulds, MD 08/31/2022 9:53 AM

## 2022-08-31 NOTE — Progress Notes (Signed)
REFERRING PROVIDER: Rachel Moulds, MD [address] [place],  [place] [address]  PRIMARY PROVIDER:  Jackie Plum, MD  PRIMARY REASON FOR VISIT:  1. Family history of ovarian cancer   2. Family history of prostate cancer   3. Family history of kidney cancer   4. Ductal carcinoma in situ (DCIS) of left breast      HISTORY OF PRESENT ILLNESS:   [name], a 67 y.o. female, was seen for a [place] cancer genetics consultation at the request of Dr. Al Pimple due to a personal and family history of cancer.  [name] presents to clinic today to discuss the possibility of a hereditary predisposition to cancer, genetic testing, and to further clarify her future cancer risks, as well as potential cancer risks for family members.   In 2024, at the age of 71, [name] was diagnosed with DCIS of the left breast. The treatment plan is unknown at this time.      CANCER HISTORY:  Oncology History  Ductal carcinoma in situ (DCIS) of left breast  07/04/2022 Mammogram   Screening mammogram suggested further evaluation for calcifications in the left breast.  She then had a diagnostic mammogram which showed 4.8 cm span of indeterminate calcifications in the left breast warranting tissue diagnosis.   08/23/2022 Pathology Results   Left breast needle core biopsy showed grade 3 DCIS, necrosis and calcifications present, prognostic showed ER 100% positive strong staining PR 60% positive moderate to strong staining   08/29/2022 Initial Diagnosis   Ductal carcinoma in situ (DCIS) of left breast      RISK FACTORS:  Menarche was at age 56.  First live birth at age 61.  OCP use for approximately 0 years.  Ovaries intact: no.  Hysterectomy: yes.  Menopausal status: postmenopausal.  HRT use: 0 years. Colonoscopy: yes; normal. Mammogram within the last year: yes. Number of breast biopsies: 1. Up to date with pelvic exams: n/a. Any excessive radiation exposure in the past: no  Past Medical  History:  Diagnosis Date   COPD (chronic obstructive pulmonary disease)    Coronary artery calcification seen on CT scan    Diabetes    DVT (deep venous thrombosis)    Family history of kidney cancer    Family history of ovarian cancer    Family history of prostate cancer    Hyperlipidemia    Hypertension    Observed sleep apnea     Past Surgical History:  Procedure Laterality Date   ABDOMINAL HYSTERECTOMY     BREAST BIOPSY Left 08/23/2022   MM LT BREAST BX W LOC DEV 1ST LESION IMAGE BX SPEC STEREO GUIDE 08/23/2022 [facility]   BREAST BIOPSY Left 08/23/2022   MM LT BREAST BX W LOC DEV EA AD LESION IMG BX SPEC STEREO GUIDE 08/23/2022 [facility]   KNEE ARTHROSCOPY Bilateral    TOTAL KNEE ARTHROPLASTY Bilateral     Social History   Socioeconomic History   Marital status: Single    Spouse name: Not on file   Number of children: Not on file   Years of education: Not on file   Highest education level: Not on file  Occupational History   Not on file  Tobacco Use   Smoking status: Every Day    Packs/day: 1.50    Years: 55.00    Additional pack years: 0.00    Total pack years: 82.50    Types: Cigarettes    Start date: 06/07/1964    Passive exposure: Current  Smokeless tobacco: Never  Vaping Use   Vaping Use: Never used  Substance and Sexual Activity   Alcohol use: Not Currently   Drug use: Yes    Types: Marijuana, "Crack" cocaine    Comment: None since 1999   Sexual activity: Not on file  Other Topics Concern   Not on file  Social History Narrative   Not on file   Social Determinants of Health   Financial Resource Strain: Not on file  Food Insecurity: Not on file  Transportation Needs: Not on file  Physical Activity: Not on file  Stress: Not on file  Social Connections: Not on file     FAMILY HISTORY:  We obtained a detailed, 4-generation family history.  Significant diagnoses are listed below: Family History  Problem Relation Age of Onset    Pulmonary embolism Mother    Heart Problems Mother    Hypertension Mother    Heart attack Father    Diabetes Father    Pulmonary embolism Sister    Healthy Sister    Pulmonary embolism Brother    Prostate cancer Brother 65   Kidney cancer Brother 37   Pulmonary embolism Brother    Ovarian cancer Maternal Aunt      The patient has two sons who are cancer free.  She has two maternal half brothers and two maternal half sisters.  One brother had prostate and kidney cancer at 60.  Both parents are deceased.  The patient's mother died at 39.  She had one sister who had ovarian cancer.  There is no other reported history of cancer.  The patients father was estranged from the family and no information is known.  [name] is unaware of previous family history of genetic testing for hereditary cancer risks. Patient's maternal ancestors are of African American descent, and paternal ancestors are of African American descent. There is no reported Ashkenazi Jewish ancestry. There is no known consanguinity.  GENETIC COUNSELING ASSESSMENT: [name] is a 67 y.o. female with a personal and family history of cancer which is somewhat suggestive of a hereditary cancer syndrome and predisposition to cancer given the combination of cancer. We, therefore, discussed and recommended the following at today's visit.   DISCUSSION: We discussed that, in general, most cancer is not inherited in families, but instead is sporadic or familial. Sporadic cancers occur by chance and typically happen at older ages (>50 years) as this type of cancer is caused by genetic changes acquired during an individual's lifetime. Some families have more cancers than would be expected by chance; however, the ages or types of cancer are not consistent with a known genetic mutation or known genetic mutations have been ruled out. This type of familial cancer is thought to be due to a combination of multiple genetic, environmental,  hormonal, and lifestyle factors. While this combination of factors likely increases the risk of cancer, the exact source of this risk is not currently identifiable or testable.  We discussed that 5 - 10% of breast cancer is hereditary, with most cases associated with BRCA mutations.  There are other genes that can be associated with hereditary breast cancer syndromes.  These include ATM, CHEK2 and PALB2.  We discussed that testing is beneficial for several reasons including knowing how to follow individuals after completing their treatment, identifying whether potential treatment options such as PARP inhibitors would be beneficial, and understand if other family members could be at risk for cancer and allow them to undergo genetic testing.  We reviewed the characteristics, features and inheritance patterns of hereditary cancer syndromes. We also discussed genetic testing, including the appropriate family members to test, the process of testing, insurance coverage and turn-around-time for results. We discussed the implications of a negative, positive and/or variant of uncertain significant result. In order to get genetic test results in a timely manner so that [name] can use these genetic test results for surgical decisions, we recommended [name] pursue genetic testing for the [facility] STAT panel. Once complete, we recommend [name] pursue reflex genetic testing to the Multi-cancer + RNA gene panel. The Multi-Cancer + RNA Panel offered by [facility] includes sequencing and/or deletion/duplication analysis of the following 70 genes:  AIP*, ALK, APC*, ATM*, AXIN2*, BAP1*, BARD1*, BLM*, BMPR1A*, BRCA1*, BRCA2*, BRIP1*, CDC73*, CDH1*, CDK4, CDKN1B*, CDKN2A, CHEK2*, CTNNA1*, DICER1*, EPCAM (del/dup only), EGFR, FH*, FLCN*, GREM1 (promoter dup only), HOXB13, KIT, LZTR1, MAX*, MBD4, MEN1*, MET, MITF, MLH1*, MSH2*, MSH3*, MSH6*, MUTYH*, NF1*, NF2*, NTHL1*, PALB2*, PDGFRA, PMS2*, POLD1*, POLE*, POT1*,  PRKAR1A*, PTCH1*, PTEN*, RAD51C*, RAD51D*, RB1*, RET, SDHA* (sequencing only), SDHAF2*, SDHB*, SDHC*, SDHD*, SMAD4*, SMARCA4*, SMARCB1*, SMARCE1*, STK11*, SUFU*, TMEM127*, TP53*, TSC1*, TSC2*, VHL*. RNA analysis is performed for * genes.  Based on [name]'s personal and family history of cancer, she meets medical criteria for genetic testing. Despite that she meets criteria, she may still have an out of pocket cost.   We discussed that some people do not want to undergo genetic testing due to fear of genetic discrimination.  The Genetic Information Nondiscrimination Act (GINA) was signed into federal law in 2008. GINA prohibits health insurers and most employers from discriminating against individuals based on genetic information (including the results of genetic tests and family history information). According to GINA, health insurance companies cannot consider genetic information to be a preexisting condition, nor can they use it to make decisions regarding coverage or rates. GINA also makes it illegal for most employers to use genetic information in making decisions about hiring, firing, promotion, or terms of employment. It is important to note that GINA does not offer protections for life insurance, disability insurance, or long-term care insurance. GINA does not apply to those in the [employer], those who work for companies with less than 15 employees, and new life insurance or long-term disability insurance policies.  Health status due to a cancer diagnosis is not protected under GINA. More information about GINA can be found by visiting [email or web address].  PLAN: After considering the risks, benefits, and limitations, [name] provided informed consent to pursue genetic testing and the blood sample was sent to [facility] for analysis of the Multi-cancer +RNA panel. Results should be available within approximately 2-3 weeks' time, at which point they will be disclosed by telephone to Ms.  [name], as will any additional recommendations warranted by these results. [name] will receive a summary of her genetic counseling visit and a copy of her results once available. This information will also be available in Epic.   Lastly, we encouraged [name] to remain in contact with cancer genetics annually so that we can continuously update the family history and inform her of any changes in cancer genetics and testing that may be of benefit for this family.   [name] questions were answered to her satisfaction today. Our contact information was provided should additional questions or concerns arise. Thank you for the referral and allowing [place] to share in the care of your patient.   Deivi Huckins P. Lowell Guitar, MS, [facility] Licensed, Certified Genetic  Counselor [email or web address] phone: [phone]  The patient was seen for a total of 30 minutes in face-to-face genetic counseling.  The patient brought her sisters. Drs. Meliton Rattan, and/or [place] were available for questions, if needed..    _______________________________________________________________________ For Office Staff:  Number of people involved in session: 3 Was an Intern/ student involved with case: no

## 2022-08-31 NOTE — Telephone Encounter (Signed)
Left message for patient to call back to schedule consult per 4/24 referral.

## 2022-09-01 ENCOUNTER — Telehealth: Payer: Self-pay | Admitting: *Deleted

## 2022-09-01 NOTE — Telephone Encounter (Signed)
Spoke with patient to follow up from new patient appt and assess navigation needs. Patient denies any questions or concerns at this time.  Contact information given and encouraged her to call should anything arise. Patient verbalized understanding 

## 2022-09-02 ENCOUNTER — Telehealth: Payer: Self-pay | Admitting: Radiation Oncology

## 2022-09-02 ENCOUNTER — Ambulatory Visit: Payer: Self-pay | Admitting: Surgery

## 2022-09-02 DIAGNOSIS — D0512 Intraductal carcinoma in situ of left breast: Secondary | ICD-10-CM

## 2022-09-02 NOTE — Telephone Encounter (Signed)
Called patient to schedule a consultation w. Dr. Moody. No answer, LVM for a return call.  

## 2022-09-05 ENCOUNTER — Encounter: Payer: Self-pay | Admitting: *Deleted

## 2022-09-06 ENCOUNTER — Telehealth: Payer: Self-pay

## 2022-09-06 ENCOUNTER — Inpatient Hospital Stay: Payer: 59 | Admitting: Licensed Clinical Social Worker

## 2022-09-06 NOTE — Telephone Encounter (Signed)
   Pre-operative Risk Assessment    Patient Name: [name]  DOB: 1955-07-15 MRN: [id number]      Request for Surgical Clearance    Procedure:   Lumpectomy  Date of Surgery:  Clearance TBD                                 Surgeon:  Harriette Bouillon, MD Surgeon's Group or Practice Name:  [facility] Phone number:  [phone] Fax number:  [phone]  Attn: Brennan Bailey, CMA   Type of Clearance Requested:   - Medical    Type of Anesthesia:  General    Additional requests/questions:  Please fax a copy of cardiac clearance  to the surgeon's office.  Signed, Viviano Simas   09/06/2022, 5:27 PM

## 2022-09-06 NOTE — Telephone Encounter (Signed)
   Name: [name]  DOB: 03/31/1956  MRN: [id number]  Primary Cardiologist: None   Preoperative team, please contact this patient and set up a phone call appointment for further preoperative risk assessment. Please obtain consent and complete medication review. Thank you for your help.  I confirm that guidance regarding antiplatelet and oral anticoagulation therapy has been completed and, if necessary, noted below.  Patient is on Xarelto for history of pulmonary embolism.  Guidance for holding will come from prescribing provider and not cardiology.   Napoleon Form, Leodis Rains, NP 09/06/2022, 5:46 PM [facility]

## 2022-09-06 NOTE — Progress Notes (Signed)
[facility] Clinical Social Work  Initial Assessment   [name] is a 67 y.o. year old female contacted by phone. Clinical Social Work was referred by new patient protocol for assessment of psychosocial needs.   SDOH (Social Determinants of Health) assessments performed: Yes SDOH Interventions    Flowsheet Row Clinical Support from 09/06/2022 in [facility]  SDOH Interventions   Food Insecurity Interventions Intervention Not Indicated  Housing Interventions Intervention Not Indicated  Transportation Interventions Intervention Not Indicated       SDOH Screenings   Food Insecurity: No Food Insecurity (09/06/2022)  Housing: Low Risk  (09/06/2022)  Transportation Needs: No Transportation Needs (09/06/2022)  Tobacco Use: High Risk (08/31/2022)     Distress Screen completed: No     No data to display            Family/Social Information:  Family members/support persons in your life? Family (sisters, sons) and Friends Transportation concerns: no  Employment: Working full time .  Income source: Employment Financial concerns: No Type of concern: None Food access concerns: no Religious or spiritual practice: Yes-strong faith in God Services Currently in place:  [facility] Medicare + Medicaid  Coping/ Adjustment to diagnosis: Patient understands treatment plan and what happens next? yes, it has all happened quickly, but she understands the general plan and is waiting on surgery date Patient reported stressors: Adjusting to my illness Current coping skills/ strengths: Capable of independent living , Manufacturing systems engineer , Motivation for treatment/growth , Religious Affiliation , and Supportive family/friends     SUMMARY: Current SDOH Barriers:  No major barriers noted today  Clinical Social Work Clinical Goal(s):  No clinical social work goals at this time  Interventions: Discussed common feeling and emotions when being diagnosed with  cancer, and the importance of support during treatment Informed patient of the support team roles and support services at [facility] Provided CSW contact information and encouraged patient to call with any questions or concerns   Follow Up Plan: Patient will contact CSW with any support or resource needs Patient verbalizes understanding of plan: Yes    [place] Harvie E Mililani Murthy, LCSW Clinical Social Worker [facility]

## 2022-09-07 ENCOUNTER — Telehealth: Payer: Self-pay

## 2022-09-07 NOTE — Telephone Encounter (Signed)
Spoke with the patient and she is agreeable with telehealth appt.   Med list reviewed and consent given.

## 2022-09-07 NOTE — Telephone Encounter (Signed)
  Patient Consent for Virtual Visit         [name] has provided verbal consent on 09/07/2022 for a virtual visit (video or telephone).   CONSENT FOR VIRTUAL VISIT FOR:  [name]  By participating in this virtual visit I agree to the following:  I hereby voluntarily request, consent and authorize [facility] and its employed or contracted physicians, physician assistants, nurse practitioners or other licensed health care professionals (the Practitioner), to provide me with telemedicine health care services (the "Services") as deemed necessary by the treating Practitioner. I acknowledge and consent to receive the Services by the Practitioner via telemedicine. I understand that the telemedicine visit will involve communicating with the Practitioner through live audiovisual communication technology and the disclosure of certain medical information by electronic transmission. I acknowledge that I have been given the opportunity to request an in-person assessment or other available alternative prior to the telemedicine visit and am voluntarily participating in the telemedicine visit.  I understand that I have the right to withhold or withdraw my consent to the use of telemedicine in the course of my care at any time, without affecting my right to future care or treatment, and that the Practitioner or I may terminate the telemedicine visit at any time. I understand that I have the right to inspect all information obtained and/or recorded in the course of the telemedicine visit and may receive copies of available information for a reasonable fee.  I understand that some of the potential risks of receiving the Services via telemedicine include:  Delay or interruption in medical evaluation due to technological equipment failure or disruption; Information transmitted may not be sufficient (e.g. poor resolution of images) to allow for appropriate medical decision making by the  Practitioner; and/or  In rare instances, security protocols could fail, causing a breach of personal health information.  Furthermore, I acknowledge that it is my responsibility to provide information about my medical history, conditions and care that is complete and accurate to the best of my ability. I acknowledge that Practitioner's advice, recommendations, and/or decision may be based on factors not within their control, such as incomplete or inaccurate data provided by me or distortions of diagnostic images or specimens that may result from electronic transmissions. I understand that the practice of medicine is not an exact science and that Practitioner makes no warranties or guarantees regarding treatment outcomes. I acknowledge that a copy of this consent can be made available to me via my patient portal [facility] MyChart), or I can request a printed copy by calling the office of [facility].    I understand that my insurance will be billed for this visit.   I have read or had this consent read to me. I understand the contents of this consent, which adequately explains the benefits and risks of the Services being provided via telemedicine.  I have been provided ample opportunity to ask questions regarding this consent and the Services and have had my questions answered to my satisfaction. I give my informed consent for the services to be provided through the use of telemedicine in my medical care

## 2022-09-08 ENCOUNTER — Encounter: Payer: Self-pay | Admitting: Genetic Counselor

## 2022-09-08 ENCOUNTER — Ambulatory Visit: Payer: Self-pay | Admitting: Genetic Counselor

## 2022-09-08 ENCOUNTER — Telehealth: Payer: Self-pay | Admitting: Genetic Counselor

## 2022-09-08 DIAGNOSIS — Z1379 Encounter for other screening for genetic and chromosomal anomalies: Secondary | ICD-10-CM

## 2022-09-08 NOTE — Progress Notes (Signed)
New Breast Cancer Diagnosis: Left Breast  Did patient present with symptoms (if so, please note symptoms) or screening mammography?:Screening Calcifications    Location and Extent of disease :left breast. Measured 1.6 cm in greatest dimension. Adenopathy no.  Histology per Pathology Report: grade 3, DCIS 08/23/2022  Receptor Status: ER(positive), PR (positive), Her2-neu (), Ki-(%)   Surgeon and surgical plan, if any:  Dr. Luisa Hart 09/02/2022 -She has opted for breast conserving surgery. -She will require central lumpectomy with resection of her nipple.    Medical oncologist, treatment if any:   Dr. Al Pimple 08/31/2022 Recommendation: 1. Breast conserving surgery 2. Followed by adjuvant radiation therapy 3. Followed by antiestrogen therapy with tamoxifen/aromatase inhibitors based on menopausal status 5 years   Family History of Breast/Ovarian/Prostate Cancer: Maternal aunt had ovarian, brother had prostate and kidney cancer.  Lymphedema issues, if any: She report some swelling and a "lump" in her breast since biopsy.     Pain issues, if any: She reports some continued pain and tenderness to her breast since her biopsy.    SAFETY ISSUES: Prior radiation? No Pacemaker/ICD? No Possible current pregnancy? Postmenopausal, hysterectomy Is the patient on methotrexate? No  Current Complaints / other details:   -Being seen by GI for questionable esophageal polyps.

## 2022-09-08 NOTE — Progress Notes (Addendum)
 HPI:  [name] was previously seen in the [facility] due to a personal and family history of cancer and concerns regarding a hereditary predisposition to cancer. Please refer to our prior cancer genetics clinic note for more information regarding our discussion, assessment and recommendations, at the time. [name] recent genetic test results were disclosed to her, as were recommendations warranted by these results. These results and recommendations are discussed in more detail below.  CANCER HISTORY:  Oncology History  Ductal carcinoma in situ (DCIS) of left breast  07/04/2022 Mammogram   Screening mammogram suggested further evaluation for calcifications in the left breast.  She then had a diagnostic mammogram which showed 4.8 cm span of indeterminate calcifications in the left breast warranting tissue diagnosis.   08/23/2022 Pathology Results   Left breast needle core biopsy showed grade 3 DCIS, necrosis and calcifications present, prognostic showed ER 100% positive strong staining PR 60% positive moderate to strong staining   08/29/2022 Initial Diagnosis   Ductal carcinoma in situ (DCIS) of left breast   09/06/2022 Genetic Testing   Negative genetic testing on the Multi-Cancer+RNA gene panel.  CHEK2 c.164C>T (p.Ser55Phe), CHEK2 c.542G>A (p.Arg181His), MSH3 c.2600T>C (p.Ile867Thr), and RET c.2611G>A (p.Val871Ile) VUS were identified.  The report date is September 06, 2022.  The Multi-Cancer + RNA Panel offered by [facility] includes sequencing and/or deletion/duplication analysis of the following 70 genes:  AIP*, ALK, APC*, ATM*, AXIN2*, BAP1*, BARD1*, BLM*, BMPR1A*, BRCA1*, BRCA2*, BRIP1*, CDC73*, CDH1*, CDK4, CDKN1B*, CDKN2A, CHEK2*, CTNNA1*, DICER1*, EPCAM (del/dup only), EGFR, FH*, FLCN*, GREM1 (promoter dup only), HOXB13, KIT, LZTR1, MAX*, MBD4, MEN1*, MET, MITF, MLH1*, MSH2*, MSH3*, MSH6*, MUTYH*, NF1*, NF2*, NTHL1*, PALB2*, PDGFRA, PMS2*, POLD1*, POLE*, POT1*,  PRKAR1A*, PTCH1*, PTEN*, RAD51C*, RAD51D*, RB1*, RET, SDHA* (sequencing only), SDHAF2*, SDHB*, SDHC*, SDHD*, SMAD4*, SMARCA4*, SMARCB1*, SMARCE1*, STK11*, SUFU*, TMEM127*, TP53*, TSC1*, TSC2*, VHL*. RNA analysis is performed for * genes.      FAMILY HISTORY:  We obtained a detailed, 4-generation family history.  Significant diagnoses are listed below: Family History  Problem Relation Age of Onset   Pulmonary embolism Mother    Heart Problems Mother    Hypertension Mother    Heart attack Father    Diabetes Father    Pulmonary embolism Sister    Healthy Sister    Pulmonary embolism Brother    Prostate cancer Brother 98   Kidney cancer Brother 43   Pulmonary embolism Brother    Ovarian cancer Maternal Aunt        The patient has two sons who are cancer free.  She has two maternal half brothers and two maternal half sisters.  One brother had prostate and kidney cancer at 61.  Both parents are deceased.   The patient's mother died at 40.  She had one sister who had ovarian cancer.  There is no other reported history of cancer.   The patients father was estranged from the family and no information is known.   [name] is unaware of previous family history of genetic testing for hereditary cancer risks. Patient's maternal ancestors are of African American descent, and paternal ancestors are of African American descent. There is no reported Ashkenazi Jewish ancestry. There is no known consanguinity.  GENETIC TEST RESULTS: Genetic testing reported out on September 06, 2022 through the Multi cancer + RNA cancer panel found no pathogenic mutations. The Multi-Cancer + RNA Panel offered by [facility] includes sequencing and/or deletion/duplication analysis of the following 70 genes:  AIP*, ALK, APC*, ATM*,  AXIN2*, BAP1*, BARD1*, BLM*, BMPR1A*, BRCA1*, BRCA2*, BRIP1*, CDC73*, CDH1*, CDK4, CDKN1B*, CDKN2A, CHEK2*, CTNNA1*, DICER1*, EPCAM (del/dup only), EGFR, FH*, FLCN*, GREM1 (promoter dup only),  HOXB13, KIT, LZTR1, MAX*, MBD4, MEN1*, MET, MITF, MLH1*, MSH2*, MSH3*, MSH6*, MUTYH*, NF1*, NF2*, NTHL1*, PALB2*, PDGFRA, PMS2*, POLD1*, POLE*, POT1*, PRKAR1A*, PTCH1*, PTEN*, RAD51C*, RAD51D*, RB1*, RET, SDHA* (sequencing only), SDHAF2*, SDHB*, SDHC*, SDHD*, SMAD4*, SMARCA4*, SMARCB1*, SMARCE1*, STK11*, SUFU*, TMEM127*, TP53*, TSC1*, TSC2*, VHL*. RNA analysis is performed for * genes. The test report has been scanned into EPIC and is located under the Molecular Pathology section of the Results Review tab.  A portion of the result report is included below for reference.     We discussed with [name] that because current genetic testing is not perfect, it is possible there may be a gene mutation in one of these genes that current testing cannot detect, but that chance is small.  We also discussed, that there could be another gene that has not yet been discovered, or that we have not yet tested, that is responsible for the cancer diagnoses in the family. It is also possible there is a hereditary cause for the cancer in the family that [name] did not inherit and therefore was not identified in her testing.  Therefore, it is important to remain in touch with cancer genetics in the future so that we can continue to offer [name] the most up to date genetic testing.   Genetic testing did identify four Variants of uncertain significance (VUS) - two in the CHEK2 gene called c.164C>T (p.Ser55Phe) and c.542G>A (p.Arg181His), another in the MSH3 gene called c.2600T>C (p.Ile867Thr), and a third in the RET gene called c.2611G>A (p.Val871Ile).  At this time, it is unknown if these variants are associated with increased cancer risk or if they are normal findings, but most variants such as these get reclassified to being inconsequential. They should not be used to make medical management decisions. With time, we suspect the lab will determine the significance of these variants, if any. If we do learn more  about them, we will try to contact [name] to discuss it further. However, it is important to stay in touch with us  periodically and keep the address and phone number up to date.  UPDATE: RET c.2611G>A (p.Val871Ile) and CHEK2 c.542G>A (p.Arg181His) have been reclassified to Likely benign.  The amended report date is August 14, 2023.  ADDITIONAL GENETIC TESTING: We discussed with [name] that her genetic testing was fairly extensive.  If there are genes identified to increase cancer risk that can be analyzed in the future, we would be happy to discuss and coordinate this testing at that time.    CANCER SCREENING RECOMMENDATIONS: [name] test result is considered negative (normal).  This means that we have not identified a hereditary cause for her personal and family history of cancer at this time. Most cancers happen by chance and this negative test suggests that her cancer may fall into this category.    While reassuring, this does not definitively rule out a hereditary predisposition to cancer. It is still possible that there could be genetic mutations that are undetectable by current technology. There could be genetic mutations in genes that have not been tested or identified to increase cancer risk.  Therefore, it is recommended she continue to follow the cancer management and screening guidelines provided by her oncology and primary healthcare provider.   An individual's cancer risk and medical management are not determined by genetic test  results alone. Overall cancer risk assessment incorporates additional factors, including personal medical history, family history, and any available genetic information that may result in a personalized plan for cancer prevention and surveillance  RECOMMENDATIONS FOR FAMILY MEMBERS:  Individuals in this family might be at some increased risk of developing cancer, over the general population risk, simply due to the family history of cancer.  We  recommended women in this family have a yearly mammogram beginning at age 25, or 13 years younger than the earliest onset of cancer, an annual clinical breast exam, and perform monthly breast self-exams. Women in this family should also have a gynecological exam as recommended by their primary provider. All family members should be referred for colonoscopy starting at age 12.  FOLLOW-UP: Lastly, we discussed with [name] that cancer genetics is a rapidly advancing field and it is possible that new genetic tests will be appropriate for her and/or her family members in the future. We encouraged her to remain in contact with cancer genetics on an annual basis so we can update her personal and family histories and let her know of advances in cancer genetics that may benefit this family.   Our contact number was provided. [name] questions were answered to her satisfaction, and she knows she is welcome to call us  at anytime with additional questions or concerns.   Darice Monte, MS, [facility] Licensed, Certified Genetic Counselor [email or web address]

## 2022-09-08 NOTE — Telephone Encounter (Signed)
Revealed negative genetic testing.  Discussed that we do not know why she has breast cancer or why there is cancer in the family. It could be due to a different gene that we are not testing, or maybe our current technology may not be able to pick something up.  It will be important for her to keep in contact with genetics to keep up with whether additional testing may be needed. 

## 2022-09-12 NOTE — Progress Notes (Signed)
Radiation Oncology         [phone] ________________________________  Name: [name]        MRN: [id number]  Date of Service: 09/13/2022 DOB: 03-Jan-1956  GE:[id number], Greggory Stallion, MD  Rachel Moulds, MD     REFERRING PHYSICIAN: Rachel Moulds, MD   DIAGNOSIS: The encounter diagnosis was Ductal carcinoma in situ (DCIS) of left breast.   HISTORY OF PRESENT ILLNESS: [name] is a 67 y.o. female seen at the request of Dr. Al Pimple for a newly diagnosed left breast cancer. The patient presented with screening detected calcifications by mammography and diagnostic imaging showed a grou of calcifications spanning up to 4.8 cm in the lateral and overlapping central left breast. A stereotactic biopsy on 08/23/22 showed high grade DCIS in the anterior and posterior biopsy both had calcifications and necrosis present and her cancer was ER/PR positive. She's met with Dr. Luisa Hart with whom she plans central lumpectomy which will involve removal of the nipple, areolar complex, and antiestrogen therapy with Dr. Al Pimple. She's seen today to discuss treatment with adjuvant radiotherapy. She's waiting to hear from surgical scheduling to determine her surgical date.    PREVIOUS RADIATION THERAPY: {EXAM; YES/NO:19492::"No"}   PAST MEDICAL HISTORY:  Past Medical History:  Diagnosis Date   COPD (chronic obstructive pulmonary disease) (HCC)    Coronary artery calcification seen on CT scan    Diabetes (HCC)    DVT (deep venous thrombosis) (HCC)    Family history of kidney cancer    Family history of ovarian cancer    Family history of prostate cancer    Hyperlipidemia    Hypertension    Observed sleep apnea        PAST SURGICAL HISTORY: Past Surgical History:  Procedure Laterality Date   ABDOMINAL HYSTERECTOMY     BREAST BIOPSY Left 08/23/2022   MM LT BREAST BX W LOC DEV 1ST LESION IMAGE BX SPEC STEREO GUIDE 08/23/2022 [facility]   BREAST BIOPSY Left 08/23/2022   MM LT BREAST  BX W LOC DEV EA AD LESION IMG BX SPEC STEREO GUIDE 08/23/2022 [facility]   KNEE ARTHROSCOPY Bilateral    TOTAL KNEE ARTHROPLASTY Bilateral      FAMILY HISTORY:  Family History  Problem Relation Age of Onset   Pulmonary embolism Mother    Heart Problems Mother    Hypertension Mother    Heart attack Father    Diabetes Father    Pulmonary embolism Sister    Healthy Sister    Pulmonary embolism Brother    Prostate cancer Brother 72   Kidney cancer Brother 29   Pulmonary embolism Brother    Ovarian cancer Maternal Aunt      SOCIAL HISTORY:  reports that she has been smoking cigarettes. She started smoking about 58 years ago. She has a 82.50 pack-year smoking history. She has been exposed to tobacco smoke. She has never used smokeless tobacco. She reports that she does not currently use alcohol. She reports current drug use. Drugs: Marijuana and "Crack" cocaine. The patient is single and lives in [place]. She ***   ALLERGIES: Codeine, Codeine phosphate, and Tramadol-acetaminophen   MEDICATIONS:  Current Outpatient Medications  Medication Sig Dispense Refill   amLODipine (NORVASC) 10 MG tablet amlodipine 10 mg tablet     celecoxib (CELEBREX) 400 MG capsule Take 400 mg by mouth 2 (two) times daily.     cyclobenzaprine (FLEXERIL) 10 MG tablet Take 5-10 mg by mouth 3 (three) times daily as needed  for muscle spasms.     diclofenac Sodium (VOLTAREN) 1 % GEL Apply 1 application topically 4 (four) times daily.     DULoxetine (CYMBALTA) 60 MG capsule Take 120 mg by mouth daily.     esomeprazole (NEXIUM) 40 MG capsule esomeprazole magnesium 40 mg capsule,delayed release  TAKE 1 CAPSULE BY MOUTH EVERY DAY     furosemide (LASIX) 40 MG tablet Take 40 mg by mouth daily.     hydrALAZINE (APRESOLINE) 25 MG tablet Take 25 mg by mouth daily.     HYDROcodone-acetaminophen (NORCO/VICODIN) 5-325 MG tablet Take 1 tablet by mouth 2 (two) times daily as needed.     isosorbide mononitrate  (IMDUR) 60 MG 24 hr tablet Take 1 tablet (60 mg total) by mouth daily. 90 tablet 2   lidocaine (LIDODERM) 5 % 1 patch every 12 (twelve) hours.     losartan (COZAAR) 100 MG tablet Take 100 mg by mouth daily.     metoprolol succinate (TOPROL-XL) 25 MG 24 hr tablet Take 1 tablet (25 mg total) by mouth daily. 30 tablet 1   MOUNJARO 5 MG/0.5ML Pen Inject 5 mg into the skin once a week.     nitroGLYCERIN (NITROSTAT) 0.4 MG SL tablet Place 1 tablet (0.4 mg total) under the tongue every 5 (five) minutes as needed. 30 tablet 3   nortriptyline (PAMELOR) 50 MG capsule nortriptyline 50 mg capsule     ondansetron (ZOFRAN) 4 MG tablet Take 4 mg by mouth every 8 (eight) hours as needed for nausea.     potassium chloride (KLOR-CON M) 10 MEQ tablet 10 mEq daily as needed (When taking lasix for edema).     rivaroxaban (XARELTO) 20 MG TABS tablet 20 mg daily with supper.     rosuvastatin (CRESTOR) 20 MG tablet Take 20 mg by mouth daily.     Tiotropium Bromide-Olodaterol (STIOLTO RESPIMAT) 2.5-2.5 MCG/ACT AERS INHALE 2 PUFFS BY MOUTH INTO THE LUNGS DAILY 4 g 0   Vitamin D, Ergocalciferol, (DRISDOL) 1.25 MG (50000 UNIT) CAPS capsule Take 50,000 Units by mouth once a week.     No current facility-administered medications for this visit.     REVIEW OF SYSTEMS: On review of systems, the patient reports that she is doing ***     PHYSICAL EXAM:  Wt Readings from Last 3 Encounters:  08/31/22 230 lb 9.6 oz (104.6 kg)  05/23/22 235 lb (106.6 kg)  02/28/22 228 lb 9.6 oz (103.7 kg)   Temp Readings from Last 3 Encounters:  08/31/22 98.1 F (36.7 C) (Temporal)  05/23/22 98.2 F (36.8 C) (Temporal)  01/28/22 97.9 F (36.6 C) (Oral)   BP Readings from Last 3 Encounters:  08/31/22 (!) 150/73  05/23/22 (!) 156/84  02/28/22 130/68   Pulse Readings from Last 3 Encounters:  08/31/22 72  05/23/22 80  02/28/22 71    In general this is a well appearing *** female in no acute distress. She's alert and oriented x4  and appropriate throughout the examination. Cardiopulmonary assessment is negative for acute distress and she exhibits normal effort. Bilateral breast exam is deferred.    ECOG = ***  0 - Asymptomatic (Fully active, able to carry on all predisease activities without restriction)  1 - Symptomatic but completely ambulatory (Restricted in physically strenuous activity but ambulatory and able to carry out work of a light or sedentary nature. For example, light housework, office work)  2 - Symptomatic, <50% in bed during the day (Ambulatory and capable of all self care but  unable to carry out any work activities. Up and about more than 50% of waking hours)  3 - Symptomatic, >50% in bed, but not bedbound (Capable of only limited self-care, confined to bed or chair 50% or more of waking hours)  4 - Bedbound (Completely disabled. Cannot carry on any self-care. Totally confined to bed or chair)  5 - Death   Santiago Glad MM, Creech RH, Tormey DC, et al. [phone]). "Toxicity and response criteria of the [facility]". Am. Evlyn Clines. Oncol. 5 (6): 649-55    LABORATORY DATA:  Lab Results  Component Value Date   WBC 4.0 08/31/2022   HGB 12.4 08/31/2022   HCT 38.4 08/31/2022   MCV 93.2 08/31/2022   PLT 208 08/31/2022   Lab Results  Component Value Date   NA 144 08/31/2022   K 4.0 08/31/2022   CL 110 08/31/2022   CO2 31 08/31/2022   Lab Results  Component Value Date   ALT 31 08/31/2022   AST 35 08/31/2022   ALKPHOS 41 08/31/2022   BILITOT 0.3 08/31/2022      RADIOGRAPHY: MM LT BREAST BX W LOC DEV 1ST LESION IMAGE BX SPEC STEREO GUIDE  Addendum Date: 08/25/2022   ADDENDUM REPORT: 08/25/2022 21:13 ADDENDUM: Pathology revealed 1. Breast, left, needle core biopsy, lateral posterior, (coil clip) DUCTAL CARCINOMA IN SITU, CRIBRIFORM TYPE, GRADE 3, NECROSIS: PRESENT- CALCIFICATIONS: PRESENT. This was found to be concordant by Dr. Gerome Sam. Pathology revealed 2. Breast, left,  needle core biopsy, lateral anterior, (ribbon clip) DUCTAL CARCINOMA IN SITU, CRIBRIFORM AND SOLID TYPES, GRADE 3- NECROSIS: PRESENT-CALCIFICATIONS: PRESENT. This was found to be concordant by Dr. Gerome Sam. Pathology results were discussed with the patient by telephone. The patient reported doing well after the biopsies with tenderness at the sites. Post biopsy instructions and care were reviewed and questions were answered. The patient was encouraged to call [facility] for any additional concerns. The patient was referred to [facility] on August 31, 2022. Consider breast MRI given high grade histology. Pathology results reported by Collene Mares RN on 08/25/2022. Electronically Signed   By: Gerome Sam III M.D.   On: 08/25/2022 21:13   Result Date: 08/25/2022 CLINICAL DATA:  The patient presents for biopsy of calcifications in the lateral left breast. Both the anterior and posterior aspects of the calcifications will be biopsied. EXAM: LEFT BREAST STEREOTACTIC CORE NEEDLE BIOPSY COMPARISON:  Previous exam(s). FINDINGS: The patient and I discussed the procedure of stereotactic-guided biopsy including benefits and alternatives. We discussed the high likelihood of a successful procedure. We discussed the risks of the procedure including infection, bleeding, tissue injury, clip migration, and inadequate sampling. Informed written consent was given. The usual time out protocol was performed immediately prior to the procedure. Using sterile technique and 1% Lidocaine as local anesthetic, under stereotactic guidance, a 9 gauge vacuum assisted device was used to perform core needle biopsy of the posterior aspect of the calcifications in the lateral left breast using a superior approach. Specimen radiograph was performed showing calcifications in several core specimens. Specimens with calcifications are  identified for pathology. Lesion quadrant: Lateral posterior At the conclusion of the procedure, a coil shaped tissue marker clip was deployed into the biopsy cavity. Follow-up 2-view mammogram was performed and dictated separately. Using sterile technique and 1% Lidocaine as local anesthetic, under stereotactic guidance, a 9 gauge vacuum assisted device was used to perform core needle  biopsy of the anterior aspect of the calcifications in the lateral left breast using a lateral approach. Specimen radiograph was performed showing calcifications in several core specimens. Specimens with calcifications are identified for pathology. Lesion quadrant: Lateral anterior At the conclusion of the procedure, a ribbon shaped tissue marker clip was deployed into the biopsy cavity. Follow-up 2-view mammogram was performed and dictated separately. IMPRESSION: Stereotactic-guided biopsy of left breast calcifications as above. No apparent complications. Electronically Signed: By: Gerome Sam III M.D. On: 08/23/2022 10:52  MM LT BREAST BX W LOC DEV EA AD LESION IMG BX SPEC STEREO GUIDE  Addendum Date: 08/25/2022   ADDENDUM REPORT: 08/25/2022 21:13 ADDENDUM: Pathology revealed 1. Breast, left, needle core biopsy, lateral posterior, (coil clip) DUCTAL CARCINOMA IN SITU, CRIBRIFORM TYPE, GRADE 3, NECROSIS: PRESENT- CALCIFICATIONS: PRESENT. This was found to be concordant by Dr. Gerome Sam. Pathology revealed 2. Breast, left, needle core biopsy, lateral anterior, (ribbon clip) DUCTAL CARCINOMA IN SITU, CRIBRIFORM AND SOLID TYPES, GRADE 3- NECROSIS: PRESENT-CALCIFICATIONS: PRESENT. This was found to be concordant by Dr. Gerome Sam. Pathology results were discussed with the patient by telephone. The patient reported doing well after the biopsies with tenderness at the sites. Post biopsy instructions and care were reviewed and questions were answered. The patient was encouraged to call [facility] for any additional concerns. The patient was referred to [facility] at [facility] on August 31, 2022. Consider breast MRI given high grade histology. Pathology results reported by Collene Mares RN on 08/25/2022. Electronically Signed   By: Gerome Sam III M.D.   On: 08/25/2022 21:13   Result Date: 08/25/2022 CLINICAL DATA:  The patient presents for biopsy of calcifications in the lateral left breast. Both the anterior and posterior aspects of the calcifications will be biopsied. EXAM: LEFT BREAST STEREOTACTIC CORE NEEDLE BIOPSY COMPARISON:  Previous exam(s). FINDINGS: The patient and I discussed the procedure of stereotactic-guided biopsy including benefits and alternatives. We discussed the high likelihood of a successful procedure. We discussed the risks of the procedure including infection, bleeding, tissue injury, clip migration, and inadequate sampling. Informed written consent was given. The usual time out protocol was performed immediately prior to the procedure. Using sterile technique and 1% Lidocaine as local anesthetic, under stereotactic guidance, a 9 gauge vacuum assisted device was used to perform core needle biopsy of the posterior aspect of the calcifications in the lateral left breast using a superior approach. Specimen radiograph was performed showing calcifications in several core specimens. Specimens with calcifications are identified for pathology. Lesion quadrant: Lateral posterior At the conclusion of the procedure, a coil shaped tissue marker clip was deployed into the biopsy cavity. Follow-up 2-view mammogram was performed and dictated separately. Using sterile technique and 1% Lidocaine as local anesthetic, under stereotactic guidance, a 9 gauge vacuum assisted device was used to perform core needle biopsy of the anterior aspect of the calcifications in the lateral left breast using a lateral approach. Specimen  radiograph was performed showing calcifications in several core specimens. Specimens with calcifications are identified for pathology. Lesion quadrant: Lateral anterior At the conclusion of the procedure, a ribbon shaped tissue marker clip was deployed into the biopsy cavity. Follow-up 2-view mammogram was performed and dictated separately. IMPRESSION: Stereotactic-guided biopsy of left breast calcifications as above. No apparent complications. Electronically Signed: By: Gerome Sam III M.D. On: 08/23/2022 10:52  MM CLIP PLACEMENT LEFT  Result Date: 08/23/2022 CLINICAL DATA:  Evaluate biopsy  marker EXAM: 3D DIAGNOSTIC LEFT MAMMOGRAM POST STEREOTACTIC BIOPSY COMPARISON:  Previous exam(s). FINDINGS: 3D Mammographic images were obtained following stereotactic guided biopsy of left breast calcifications. The coil shaped clip is at the site of the lateral posterior biopsied calcifications. The ribbon shaped clip migrated 1.8 cm medial to the lateral anterior biopsied calcifications. IMPRESSION: The coil shaped clip is at the site of the lateral posterior biopsied calcifications. The ribbon shaped clip migrated 1.8 cm medial to the lateral anterior biopsied calcifications. Final Assessment: Post Procedure Mammograms for Marker Placement Electronically Signed   By: Gerome Sam III M.D.   On: 08/23/2022 10:55  MM Digital Diagnostic Unilat L  Result Date: 08/15/2022 CLINICAL DATA:  Patient returns after screening study for evaluation of LEFT breast calcifications. EXAM: DIGITAL DIAGNOSTIC UNILATERAL LEFT MAMMOGRAM WITH CAD TECHNIQUE: Left digital diagnostic mammography was performed. COMPARISON:  Previous exam(s). ACR Breast Density Category b: There are scattered areas of fibroglandular density. FINDINGS: Magnified views are performed of calcifications in the central and LATERAL anterior aspect of the LEFT breast. These views demonstrate faint pleomorphic calcifications in a segmental distribution spanning at  least 4.8 x 1.7 x 4.2 centimeters. Calcifications extend towards the nipple, and likely extend to just below the skin. IMPRESSION: 4.8 centimeters span of indeterminate calcifications in the LEFT breast warranting tissue diagnosis. RECOMMENDATION: Two-site stereotactic biopsy of LEFT breast calcifications, targeting anterior and posterior extent, if impossible. I have discussed the findings and recommendations with the patient. If applicable, a reminder letter will be sent to the patient regarding the next appointment. BI-RADS CATEGORY  4: Suspicious. Electronically Signed   By: Norva Pavlov M.D.   On: 08/15/2022 16:14      IMPRESSION/PLAN: 1. High Grade, ER/PR positive DCIS of the left breast. Dr. Mitzi Hansen discusses the pathology findings and reviews the nature of early stage left breast disease. She is planning breast conservation with lumpectomy and Dr. Mitzi Hansen recommends external radiotherapy to the breast  to reduce risks of local recurrence followed by antiestrogen therapy. We discussed the risks, benefits, short, and long term effects of radiotherapy, as well as the curative intent, and the patient is interested in proceeding. Dr. Mitzi Hansen discusses the delivery and logistics of radiotherapy and anticipates a course of 4 weeks of radiotherapy to the left breast. We will see her back a few weeks after surgery to discuss the simulation process and anticipate we starting radiotherapy about 4-6 weeks after surgery.  2. Possible genetic predisposition to malignancy. The patient is a candidate for genetic testing given her personal and family history. She has met with genetics already and we will follow up with the results of her testing expectantly.   In a visit lasting *** minutes, greater than 50% of the time was spent face to face reviewing her case, as well as in preparation of, discussing, and coordinating the patient's care.  The above documentation reflects my direct findings during this shared  patient visit. Please see the separate note by Dr. Mitzi Hansen on this date for the remainder of the patient's plan of care.    Osker Mason, [facility]    **Disclaimer: This note was dictated with voice recognition software. Similar sounding words can inadvertently be transcribed and this note may contain transcription errors which may not have been corrected upon publication of note.**

## 2022-09-13 ENCOUNTER — Ambulatory Visit
Admission: RE | Admit: 2022-09-13 | Discharge: 2022-09-13 | Disposition: A | Discharge status: Home / Self Care | Payer: 59 | Source: Ambulatory Visit | Attending: Radiation Oncology | Admitting: Radiation Oncology

## 2022-09-13 ENCOUNTER — Other Ambulatory Visit: Payer: Self-pay

## 2022-09-13 ENCOUNTER — Encounter: Payer: Self-pay | Admitting: Radiation Oncology

## 2022-09-13 VITALS — BP 157/78 | HR 85 | Temp 98.2°F | Resp 18 | Wt 229.4 lb

## 2022-09-13 VITALS — BP 157/78 | HR 85 | Temp 98.2°F | Resp 18 | Ht 64.0 in | Wt 229.4 lb

## 2022-09-13 DIAGNOSIS — I10 Essential (primary) hypertension: Secondary | ICD-10-CM | POA: Diagnosis not present

## 2022-09-13 DIAGNOSIS — G4730 Sleep apnea, unspecified: Secondary | ICD-10-CM | POA: Diagnosis not present

## 2022-09-13 DIAGNOSIS — Z8051 Family history of malignant neoplasm of kidney: Secondary | ICD-10-CM | POA: Insufficient documentation

## 2022-09-13 DIAGNOSIS — Z170 Estrogen receptor positive status [ER+]: Secondary | ICD-10-CM | POA: Insufficient documentation

## 2022-09-13 DIAGNOSIS — I2510 Atherosclerotic heart disease of native coronary artery without angina pectoris: Secondary | ICD-10-CM | POA: Diagnosis not present

## 2022-09-13 DIAGNOSIS — Z8042 Family history of malignant neoplasm of prostate: Secondary | ICD-10-CM | POA: Diagnosis not present

## 2022-09-13 DIAGNOSIS — Z86718 Personal history of other venous thrombosis and embolism: Secondary | ICD-10-CM | POA: Insufficient documentation

## 2022-09-13 DIAGNOSIS — E119 Type 2 diabetes mellitus without complications: Secondary | ICD-10-CM | POA: Insufficient documentation

## 2022-09-13 DIAGNOSIS — D0512 Intraductal carcinoma in situ of left breast: Secondary | ICD-10-CM

## 2022-09-13 DIAGNOSIS — F17210 Nicotine dependence, cigarettes, uncomplicated: Secondary | ICD-10-CM | POA: Insufficient documentation

## 2022-09-13 DIAGNOSIS — E785 Hyperlipidemia, unspecified: Secondary | ICD-10-CM | POA: Diagnosis not present

## 2022-09-13 DIAGNOSIS — Z791 Long term (current) use of non-steroidal anti-inflammatories (NSAID): Secondary | ICD-10-CM | POA: Insufficient documentation

## 2022-09-13 DIAGNOSIS — J449 Chronic obstructive pulmonary disease, unspecified: Secondary | ICD-10-CM | POA: Insufficient documentation

## 2022-09-13 DIAGNOSIS — Z79899 Other long term (current) drug therapy: Secondary | ICD-10-CM | POA: Insufficient documentation

## 2022-09-14 ENCOUNTER — Ambulatory Visit: Payer: 59 | Attending: Cardiology | Admitting: Student

## 2022-09-14 DIAGNOSIS — Z01810 Encounter for preprocedural cardiovascular examination: Secondary | ICD-10-CM

## 2022-09-14 NOTE — Progress Notes (Signed)
Virtual Visit via Telephone Note   Because of [name]'s co-morbid illnesses, she is at least at moderate risk for complications without adequate follow up.  This format is felt to be most appropriate for this patient at this time.  The patient did not have access to video technology/had technical difficulties with video requiring transitioning to audio format only (telephone).  All issues noted in this document were discussed and addressed.  No physical exam could be performed with this format.  Please refer to the patient's chart for her consent to telehealth for [facility].  Evaluation Performed:  Preoperative cardiovascular risk assessment _____________   Date:  09/14/2022   Patient ID:  [name], [name] 08-11-1955, MRN [id number] Patient Location:  Home Provider location:   Office  Primary Care Provider:  Jackie Plum, MD Primary Cardiologist:  None  Chief Complaint / Patient Profile   67 y.o. y/o female with a h/o nonobstructive CAD, DVT/PE 2006, hypertension, hyperlipidemia, tobacco abuse, T2DM, GERD, OSA, COPD who is pending lumpectomy by Dr. Luisa [name] and presents today for telephonic preoperative cardiovascular risk assessment.  History of Present Illness    [name] is a 67 y.o. female who presents via audio/video conferencing for a telehealth visit today.  Pt was last seen in cardiology clinic on 02/28/2022 by Mary Searing, NP.  At that time [name] was complaining of fatigue with left arm pain and tightness under left breast with and without activity and relieved with NTG.  Echo showed normal LV function with mild LAE.  Exercise stress test was negative for ischemia.  The patient is now pending procedure as outlined above. Since her last visit, she is doing well from a cardiac standpoint. Patient denies shortness of breath or dyspnea on exertion. No chest pain, pressure, or tightness. Denies orthopnea or PND. No palpitations. She  has chronic lower extremity edema that is stable and unchanged. She does not do a formal exercise program but she is independent with ADLs and able to perform light and moderate household activities without difficulty. She also continues to work part time.    Past Medical History    Past Medical History:  Diagnosis Date   Breast cancer (HCC) 08/23/2022   COPD (chronic obstructive pulmonary disease) (HCC)    Coronary artery calcification seen on CT scan    Diabetes (HCC)    DVT (deep venous thrombosis) (HCC)    Family history of kidney cancer    Family history of ovarian cancer    Family history of prostate cancer    Hyperlipidemia    Hypertension    Observed sleep apnea    Past Surgical History:  Procedure Laterality Date   ABDOMINAL HYSTERECTOMY     BREAST BIOPSY Left 08/23/2022   MM LT BREAST BX W LOC DEV 1ST LESION IMAGE BX SPEC STEREO GUIDE 08/23/2022 [facility]   BREAST BIOPSY Left 08/23/2022   MM LT BREAST BX W LOC DEV EA AD LESION IMG BX SPEC STEREO GUIDE 08/23/2022 [facility]   KNEE ARTHROSCOPY Bilateral    TOTAL KNEE ARTHROPLASTY Bilateral     Allergies  Allergies  Allergen Reactions   Codeine     REACTION: itch   Codeine Phosphate     REACTION: itching   Tramadol-Acetaminophen     REACTION: itch    Home Medications    Prior to Admission medications   Medication Sig Start Date End Date Taking? Authorizing Provider  amLODipine (NORVASC) 10 MG tablet amlodipine  10 mg tablet    [provider]  celecoxib (CELEBREX) 400 MG capsule Take 400 mg by mouth 2 (two) times daily. 03/31/21   [provider]  cyclobenzaprine (FLEXERIL) 10 MG tablet Take 5-10 mg by mouth 3 (three) times daily as needed for muscle spasms. 03/29/21   [provider]  diclofenac Sodium (VOLTAREN) 1 % GEL Apply 1 application topically 4 (four) times daily. 04/07/21   [provider]  DULoxetine (CYMBALTA) 60 MG capsule Take 120 mg by mouth daily.  03/23/21   [provider]  esomeprazole (NEXIUM) 40 MG capsule esomeprazole magnesium 40 mg capsule,delayed release  TAKE 1 CAPSULE BY MOUTH EVERY DAY 04/07/14   [provider]  furosemide (LASIX) 40 MG tablet Take 40 mg by mouth daily. 11/19/16   [provider]  hydrALAZINE (APRESOLINE) 25 MG tablet Take 25 mg by mouth daily.    [provider]  HYDROcodone-acetaminophen (NORCO/VICODIN) 5-325 MG tablet Take 1 tablet by mouth 2 (two) times daily as needed. 02/20/22   [provider]  isosorbide mononitrate (IMDUR) 60 MG 24 hr tablet Take 1 tablet (60 mg total) by mouth daily. 02/28/22 11/25/22  Mary Islam., NP  lidocaine (LIDODERM) 5 % 1 patch every 12 (twelve) hours. 02/09/22   [provider]  losartan (COZAAR) 100 MG tablet Take 100 mg by mouth daily. 04/03/21   [provider]  metoprolol succinate (TOPROL-XL) 25 MG 24 hr tablet Take 1 tablet (25 mg total) by mouth daily. 03/18/22   Baldo Daub, MD  MOUNJARO 5 MG/0.5ML Pen Inject 5 mg into the skin once a week. 08/14/22   [provider]  nitroGLYCERIN (NITROSTAT) 0.4 MG SL tablet Place 1 tablet (0.4 mg total) under the tongue every 5 (five) minutes as needed. 06/03/21 09/07/22  Baldo Daub, MD  nortriptyline (PAMELOR) 50 MG capsule nortriptyline 50 mg capsule    [provider]  ondansetron (ZOFRAN) 4 MG tablet Take 4 mg by mouth every 8 (eight) hours as needed for nausea.    [provider]  potassium chloride (KLOR-CON M) 10 MEQ tablet 10 mEq daily as needed (When taking lasix for edema).    [provider]  rivaroxaban (XARELTO) 20 MG TABS tablet 20 mg daily with supper.    [provider]  rosuvastatin (CRESTOR) 20 MG tablet Take 20 mg by mouth daily. 08/09/10   [provider]  Tiotropium Bromide-Olodaterol (STIOLTO RESPIMAT) 2.5-2.5 MCG/ACT AERS INHALE 2 PUFFS BY MOUTH INTO THE LUNGS DAILY 04/05/21   Oretha Milch,  MD  Vitamin D, Ergocalciferol, (DRISDOL) 1.25 MG (50000 UNIT) CAPS capsule Take 50,000 Units by mouth once a week. 03/23/21   [provider]    Physical Exam    Vital Signs:  [name] does not have vital signs available for review today.  Given telephonic nature of communication, physical exam is limited. AAOx3. NAD. Normal affect.  Speech and respirations are unlabored.  Accessory Clinical Findings    None  Assessment & Plan    Primary Cardiologist: None  Preoperative cardiovascular risk assessment.  Lumpectomy by Dr. Luisa [name].  Chart reviewed as part of pre-operative protocol coverage. According to the RCRI, patient has a 0.4% risk of MACE. Patient reports activity equivalent to 5.07 METS (per [facility]).   Given past medical history and time since last visit, based on ACC/AHA guidelines, [name] would be at acceptable risk for the planned procedure without further cardiovascular testing.  Patient was advised that if she develops new symptoms prior to surgery to contact our office to arrange a follow-up appointment.  she verbalized understanding.  Xarelto prescribed by a noncardiology provider.  Recommendation for holding Xarelto deferred to prescribing provider.    I will route this recommendation to the requesting party via Epic fax function.  Please call with questions.  Time:   Today, I have spent 5 minutes with the patient with telehealth technology discussing medical history, symptoms, and management plan.     Mary Levering, NP  09/14/2022, 8:45 AM

## 2022-09-15 ENCOUNTER — Telehealth: Payer: Self-pay | Admitting: Student

## 2022-09-15 NOTE — Telephone Encounter (Signed)
Verified patient meal yesterday was steak and "it may have been seasoned".  She will continue as advised will call if weight increase of 3 lbs in a day or 5 lbs in a week or if SOB .Patient states understanding

## 2022-09-15 NOTE — Telephone Encounter (Signed)
Patient state has swelling today in Right foot and calf.  Also Left Calf, none in left foot. States started today and non pitting. She states took off compression stockings and put on diabetic socks instead. She wore compression for about 3 hours, was up for medication and breakfast. No activities or excess walking. Been sitting at the desk. Not elevating while sitting but will start today during the day. No weight gain that she is aware of.  No SOB . This would happen if she was on her feet a lot during the day but this is quicker and with no more than she has done today it is out of the ordinary and that is why the concern.  Takes Lasix 40mg  daily and took today.  Advised to elevate as much as possible while at desk.  Watch sodium intake. Continue with compression or diabetic socks.  Will call with further recommendatiosn from provider

## 2022-09-15 NOTE — Telephone Encounter (Signed)
LM to please return call to office

## 2022-09-15 NOTE — Telephone Encounter (Signed)
Pt c/o swelling: STAT is pt has developed SOB within 24 hours  If swelling, where is the swelling located? both legs and tightness in right foot  How much weight have you gained and in what time span?   Have you gained 3 pounds in a day or 5 pounds in a week?   Do you have a log of your daily weights (if so, list)?   Are you currently taking a fluid pill? yes  Are you currently SOB? No, a little lightheaded  Have you traveled recently?  No     Patient said she had a Telephone Visit yesterday to be cleared for surgery She said she was asked did she have any swelling and she said no. She wanted to be sure that they knew that she have swelling today and have not been doing that much this morning to have her feet swelling like this.

## 2022-10-05 ENCOUNTER — Telehealth: Payer: Self-pay | Admitting: Cardiology

## 2022-10-05 NOTE — Telephone Encounter (Signed)
Patient called to follow-up on the status of her pre-op clearance.

## 2022-10-05 NOTE — Telephone Encounter (Signed)
I s/w the pt and assured her that we did clear her during her appt with Carlos Levering, [facility] 09/14/22 and notes were faxed to the surgeon's office. Pt states the surgeon office said they did not have it. I stated that I will be happy to re-fax notes to surgeon office. Pt thanked me for the help.

## 2022-10-07 ENCOUNTER — Encounter: Payer: Self-pay | Admitting: *Deleted

## 2022-10-07 ENCOUNTER — Telehealth: Payer: Self-pay | Admitting: *Deleted

## 2022-10-07 NOTE — Telephone Encounter (Signed)
Spoke with patient regarding her 6/4 appt with Dr. Al Pimple. Informed her since she hasn't had sx yet we will just cancel this appt and wait till after sx and xrt to r/s it. Patient verbalized understanding.

## 2022-10-11 ENCOUNTER — Ambulatory Visit: Payer: 59 | Admitting: Hematology and Oncology

## 2022-10-14 ENCOUNTER — Encounter: Payer: Self-pay | Admitting: *Deleted

## 2022-10-19 ENCOUNTER — Other Ambulatory Visit: Payer: Self-pay | Admitting: Surgery

## 2022-10-19 DIAGNOSIS — D0512 Intraductal carcinoma in situ of left breast: Secondary | ICD-10-CM

## 2022-10-21 ENCOUNTER — Telehealth: Payer: Self-pay | Admitting: Hematology and Oncology

## 2022-10-21 NOTE — Telephone Encounter (Signed)
Patient is aware of upcoming appointment date/times.  

## 2022-11-08 ENCOUNTER — Encounter: Payer: Self-pay | Admitting: *Deleted

## 2022-11-08 DIAGNOSIS — D0512 Intraductal carcinoma in situ of left breast: Secondary | ICD-10-CM

## 2022-11-15 ENCOUNTER — Encounter (HOSPITAL_BASED_OUTPATIENT_CLINIC_OR_DEPARTMENT_OTHER): Payer: Self-pay | Admitting: Surgery

## 2022-11-15 ENCOUNTER — Other Ambulatory Visit: Payer: Self-pay

## 2022-11-18 ENCOUNTER — Encounter (HOSPITAL_BASED_OUTPATIENT_CLINIC_OR_DEPARTMENT_OTHER)
Admission: RE | Admit: 2022-11-18 | Discharge: 2022-11-18 | Disposition: A | Discharge status: Home / Self Care | Payer: 59 | Source: Ambulatory Visit | Attending: Surgery | Admitting: Surgery

## 2022-11-18 DIAGNOSIS — Z01812 Encounter for preprocedural laboratory examination: Secondary | ICD-10-CM | POA: Diagnosis not present

## 2022-11-18 DIAGNOSIS — Z79899 Other long term (current) drug therapy: Secondary | ICD-10-CM | POA: Insufficient documentation

## 2022-11-18 LAB — BASIC METABOLIC PANEL
Anion gap: 6 (ref 5–15)
BUN: 12 mg/dL (ref 8–23)
CO2: 26 mmol/L (ref 22–32)
Calcium: 8.9 mg/dL (ref 8.9–10.3)
Chloride: 108 mmol/L (ref 98–111)
Creatinine, Ser: 0.92 mg/dL (ref 0.44–1.00)
GFR, Estimated: >60 mL/min (ref >60)
Glucose, Bld: 92 mg/dL (ref 70–99)
Potassium: 4.6 mmol/L (ref 3.5–5.1)
Sodium: 140 mmol/L (ref 135–145)

## 2022-11-18 MED ORDER — CHLORHEXIDINE GLUCONATE CLOTH 2 % EX PADS: 6.0000 | MEDICATED_PAD | Freq: Once | CUTANEOUS | Status: DC

## 2022-11-18 NOTE — Progress Notes (Signed)

## 2022-11-22 ENCOUNTER — Ambulatory Visit
Admission: RE | Admit: 2022-11-22 | Discharge: 2022-11-22 | Disposition: A | Discharge status: Home / Self Care | Payer: 59 | Source: Ambulatory Visit | Attending: Surgery | Admitting: Surgery

## 2022-11-22 DIAGNOSIS — D0512 Intraductal carcinoma in situ of left breast: Secondary | ICD-10-CM

## 2022-11-22 HISTORY — PX: BREAST BIOPSY: SHX20

## 2022-11-23 ENCOUNTER — Other Ambulatory Visit: Payer: Self-pay

## 2022-11-23 ENCOUNTER — Encounter (HOSPITAL_BASED_OUTPATIENT_CLINIC_OR_DEPARTMENT_OTHER): Payer: Self-pay | Admitting: Surgery

## 2022-11-23 ENCOUNTER — Ambulatory Visit
Admission: RE | Admit: 2022-11-23 | Discharge: 2022-11-23 | Disposition: A | Discharge status: Home / Self Care | Payer: 59 | Source: Ambulatory Visit | Attending: Surgery | Admitting: Surgery

## 2022-11-23 ENCOUNTER — Ambulatory Visit (HOSPITAL_BASED_OUTPATIENT_CLINIC_OR_DEPARTMENT_OTHER)
Admission: RE | Admit: 2022-11-23 | Discharge: 2022-11-23 | Disposition: A | Discharge status: Home / Self Care | Payer: 59 | Attending: Surgery | Admitting: Surgery

## 2022-11-23 ENCOUNTER — Ambulatory Visit (HOSPITAL_BASED_OUTPATIENT_CLINIC_OR_DEPARTMENT_OTHER): Payer: 59 | Admitting: Anesthesiology

## 2022-11-23 ENCOUNTER — Encounter (HOSPITAL_BASED_OUTPATIENT_CLINIC_OR_DEPARTMENT_OTHER)
Admission: RE | Disposition: A | Discharge status: Home / Self Care | Payer: Self-pay | Source: Home / Self Care | Attending: Surgery

## 2022-11-23 DIAGNOSIS — Z86718 Personal history of other venous thrombosis and embolism: Secondary | ICD-10-CM | POA: Diagnosis not present

## 2022-11-23 DIAGNOSIS — J449 Chronic obstructive pulmonary disease, unspecified: Secondary | ICD-10-CM | POA: Insufficient documentation

## 2022-11-23 DIAGNOSIS — Z7985 Long-term (current) use of injectable non-insulin antidiabetic drugs: Secondary | ICD-10-CM | POA: Insufficient documentation

## 2022-11-23 DIAGNOSIS — F17210 Nicotine dependence, cigarettes, uncomplicated: Secondary | ICD-10-CM

## 2022-11-23 DIAGNOSIS — D0512 Intraductal carcinoma in situ of left breast: Secondary | ICD-10-CM | POA: Diagnosis not present

## 2022-11-23 DIAGNOSIS — E785 Hyperlipidemia, unspecified: Secondary | ICD-10-CM | POA: Insufficient documentation

## 2022-11-23 DIAGNOSIS — G4730 Sleep apnea, unspecified: Secondary | ICD-10-CM | POA: Diagnosis not present

## 2022-11-23 DIAGNOSIS — I10 Essential (primary) hypertension: Secondary | ICD-10-CM | POA: Diagnosis not present

## 2022-11-23 DIAGNOSIS — Z79899 Other long term (current) drug therapy: Secondary | ICD-10-CM | POA: Insufficient documentation

## 2022-11-23 DIAGNOSIS — K219 Gastro-esophageal reflux disease without esophagitis: Secondary | ICD-10-CM | POA: Diagnosis not present

## 2022-11-23 DIAGNOSIS — E119 Type 2 diabetes mellitus without complications: Secondary | ICD-10-CM | POA: Diagnosis not present

## 2022-11-23 DIAGNOSIS — D0592 Unspecified type of carcinoma in situ of left breast: Secondary | ICD-10-CM

## 2022-11-23 DIAGNOSIS — I2510 Atherosclerotic heart disease of native coronary artery without angina pectoris: Secondary | ICD-10-CM | POA: Insufficient documentation

## 2022-11-23 DIAGNOSIS — F17200 Nicotine dependence, unspecified, uncomplicated: Secondary | ICD-10-CM | POA: Diagnosis not present

## 2022-11-23 DIAGNOSIS — Z7901 Long term (current) use of anticoagulants: Secondary | ICD-10-CM | POA: Insufficient documentation

## 2022-11-23 DIAGNOSIS — F419 Anxiety disorder, unspecified: Secondary | ICD-10-CM | POA: Insufficient documentation

## 2022-11-23 DIAGNOSIS — Z170 Estrogen receptor positive status [ER+]: Secondary | ICD-10-CM | POA: Diagnosis not present

## 2022-11-23 DIAGNOSIS — Z01818 Encounter for other preprocedural examination: Secondary | ICD-10-CM

## 2022-11-23 HISTORY — DX: Unspecified asthma, uncomplicated: J45.909

## 2022-11-23 HISTORY — PX: BREAST LUMPECTOMY WITH RADIOACTIVE SEED LOCALIZATION: SHX6424

## 2022-11-23 LAB — GLUCOSE, CAPILLARY
Glucose-Capillary: 86 mg/dL (ref 70–99)
Glucose-Capillary: 101 mg/dL — ABNORMAL HIGH (ref 70–99)

## 2022-11-23 IMAGING — CT CT ABD-PELV W/ CM
1 of 2 series · 14 of 32 positions shown, 19 images · IV contrast (APPLIED)
Comparison: CT abdomen pelvis 07/01/2019

CLINICAL DATA: Right lower quadrant abdominal pain

EXAM:
CT ABDOMEN AND PELVIS WITH CONTRAST
TECHNIQUE: Multidetector CT imaging of the abdomen and pelvis was performed
using the standard protocol following bolus administration of
intravenous contrast.
CONTRAST:  100mL RQFXG8-1YY IOPAMIDOL (RQFXG8-1YY) INJECTION 61%

[Series 2: abd/pelvis w/cm · axial · 0.86mm/px · z∈[-470,-105]mm · 14 of 85 slices shown, 19 images]
[im 6/85  soft-tissue]
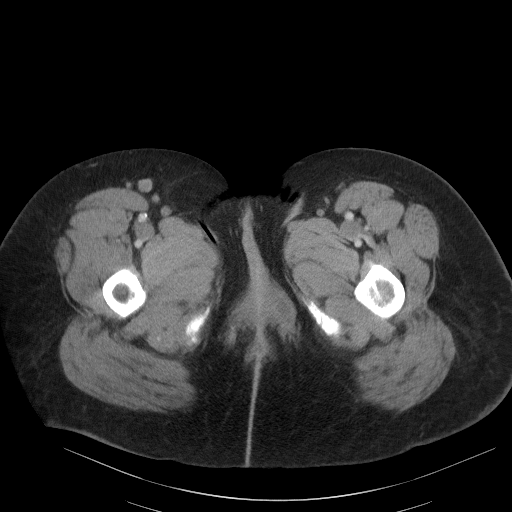
[im 6/85  bone]
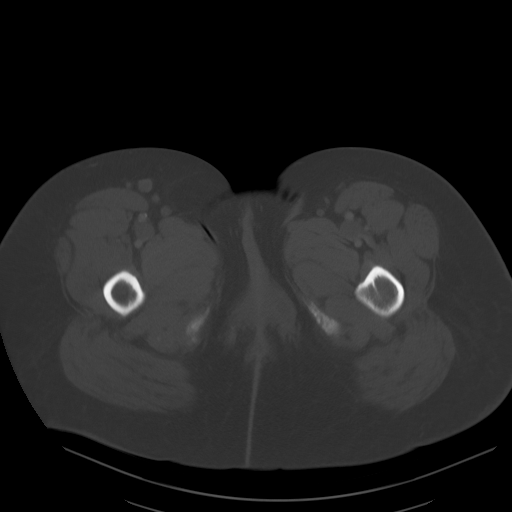
[im 11/85  soft-tissue]
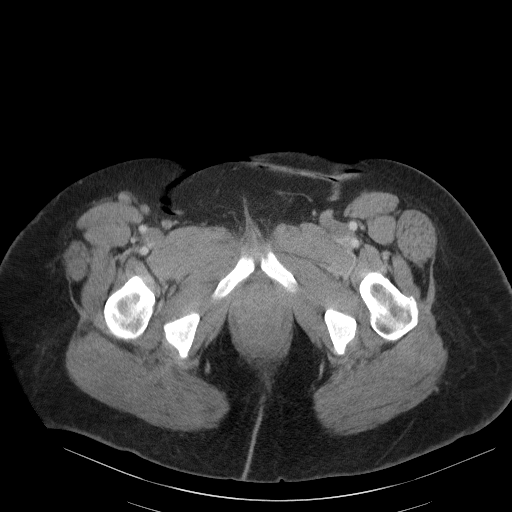
[im 16/85  soft-tissue]
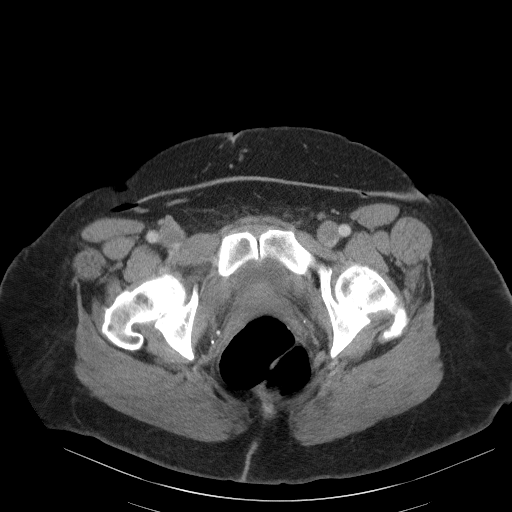
[im 27/85  soft-tissue]
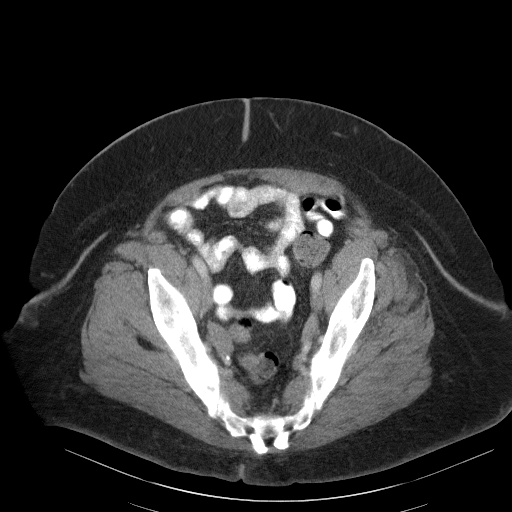
[im 32/85  soft-tissue]
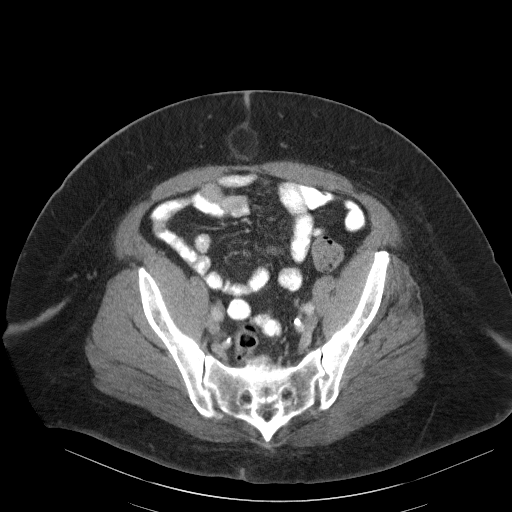
[im 37/85  soft-tissue]
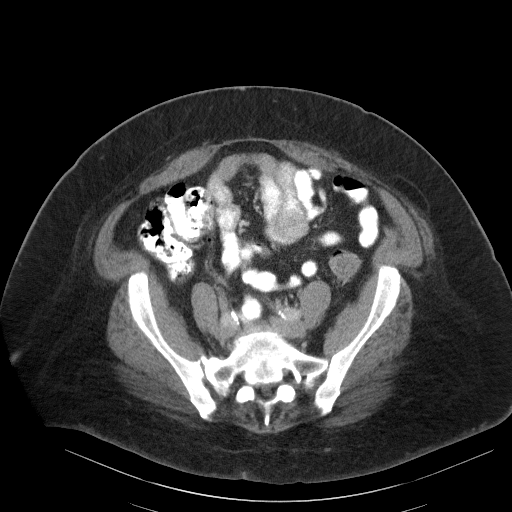
[im 43/85  soft-tissue]
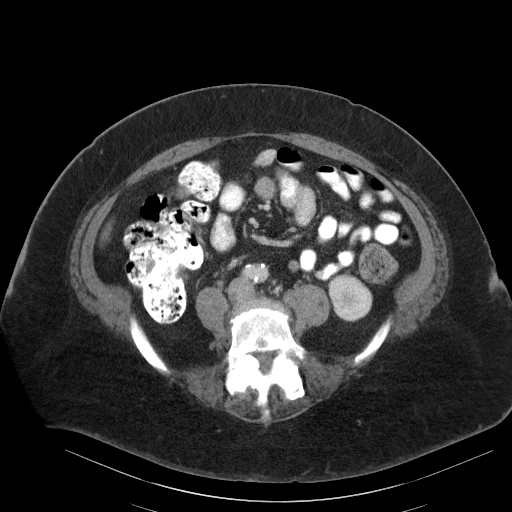
[im 48/85  soft-tissue]
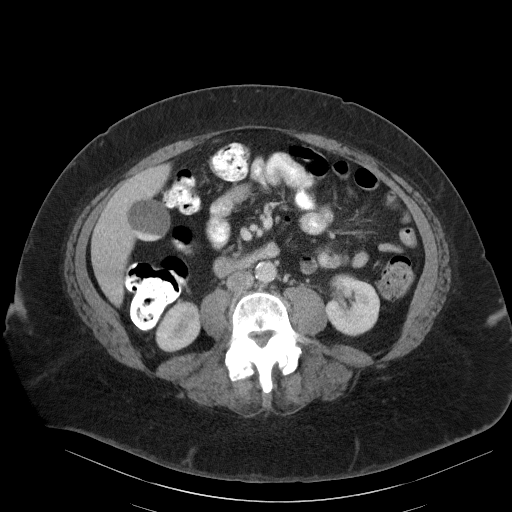
[im 53/85  soft-tissue]
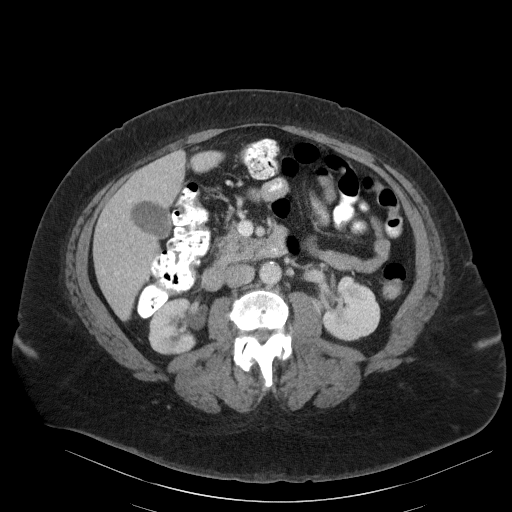
[im 53/85  bone]
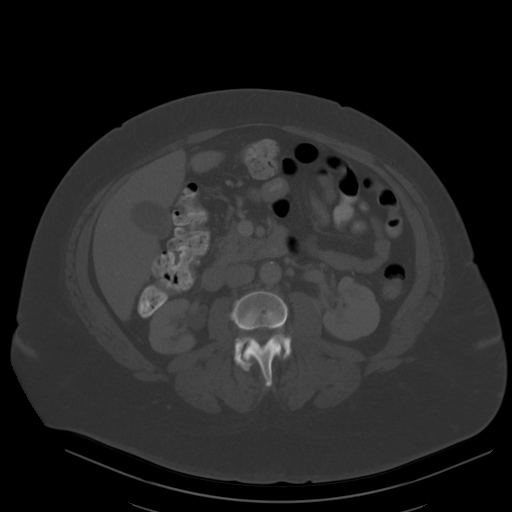
[im 58/85  soft-tissue]
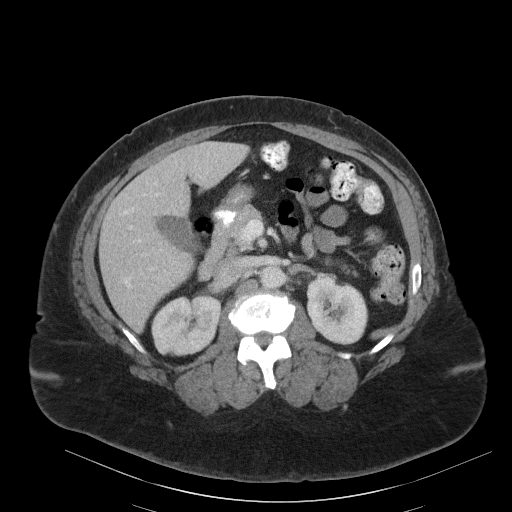
[im 64/85  lung]
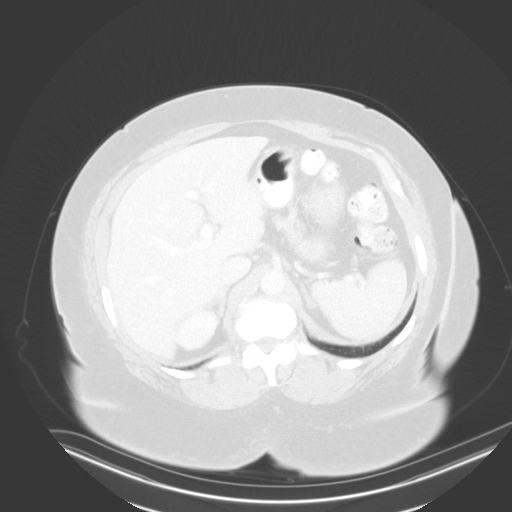
[im 69/85  soft-tissue]
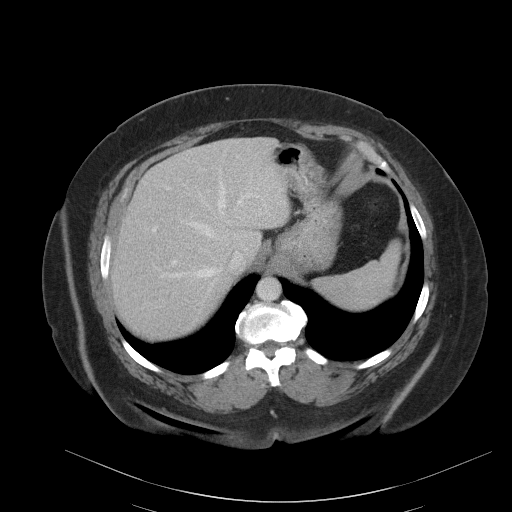
[im 69/85  lung]
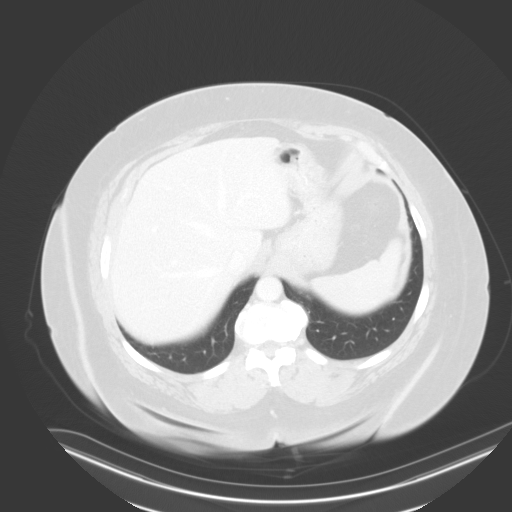
[im 74/85  soft-tissue]
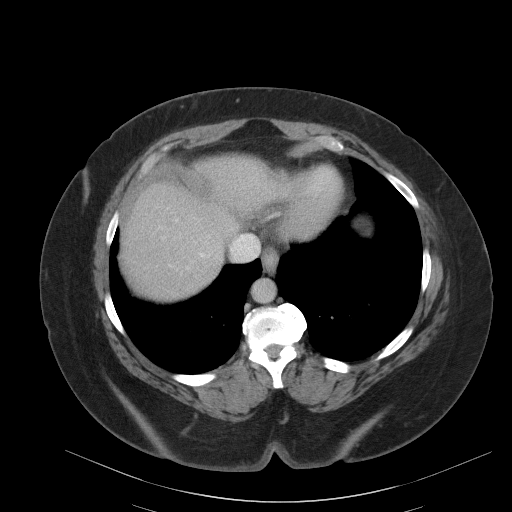
[im 74/85  lung]
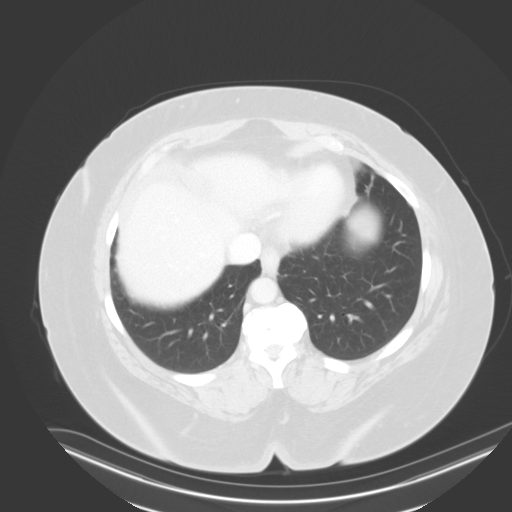
[im 79/85  soft-tissue]
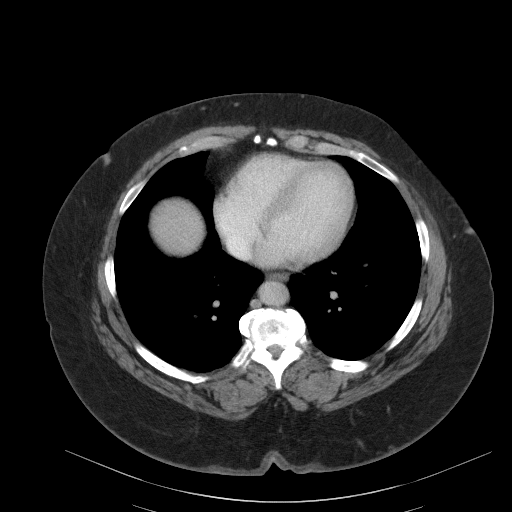
[im 79/85  lung]
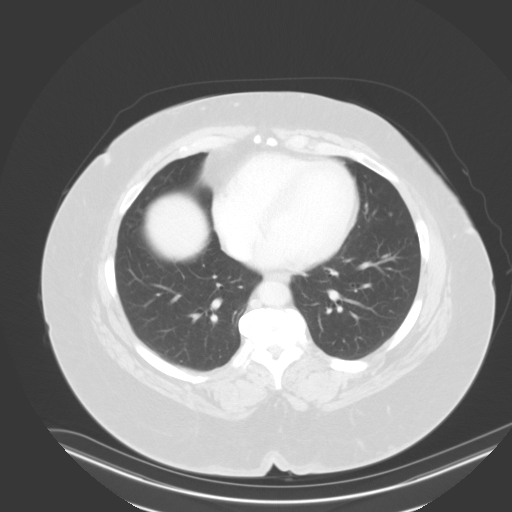

[14 of 32 positions shown; findings below may reference images not displayed]

FINDINGS: Lower chest: No acute abnormality.

Hepatobiliary: Mild cholelithiasis without additional CT evidence of
acute cholecystitis. No bile duct dilatation.

Pancreas: Unremarkable. No pancreatic ductal dilatation or
surrounding inflammatory changes.

Spleen: Normal in size without focal abnormality.

Adrenals/Urinary Tract: Mild thickening of the left adrenal gland
unchanged from prior examination is likely due to hyperplasia.
Adrenal glands otherwise normal in appearance. Irregular thinning of
the right renal cortex likely due to prior [place] or inflammatory
etiology. Kidneys otherwise normal in appearance.

Evaluation of the bladder limited due to collapsed configuration.

Stomach/Bowel: Stomach is within normal limits. Appendix appears
normal. No evidence of bowel wall thickening, distention, or
inflammatory changes.

Vascular/Lymphatic: No significant vascular findings are present. No
enlarged abdominal or pelvic lymph nodes.

Reproductive: Status post hysterectomy. No adnexal masses.

Other: Small fat containing umbilical hernia.

Musculoskeletal: Degenerative changes seen throughout the lumbar
spine. Grade 1 anterolisthesis of L4 on L5 unchanged from prior
examination.
IMPRESSION: No acute abnormality of the abdomen or pelvis.

## 2022-11-23 SURGERY — BREAST LUMPECTOMY WITH RADIOACTIVE SEED LOCALIZATION
Anesthesia: General | Site: Breast | Laterality: Left

## 2022-11-23 MED ORDER — FENTANYL CITRATE (PF) 100 MCG/2ML IJ SOLN
INTRAMUSCULAR | Status: DC | PRN
  Administered 2022-11-23: 50 ug via INTRAVENOUS

## 2022-11-23 MED ORDER — ONDANSETRON HCL 4 MG/2ML IJ SOLN: 4.0000 mg | Freq: Once | INTRAMUSCULAR | Status: DC | PRN

## 2022-11-23 MED ORDER — FENTANYL CITRATE (PF) 100 MCG/2ML IJ SOLN
INTRAMUSCULAR | Status: AC
  Filled 2022-11-23: qty 2

## 2022-11-23 MED ORDER — PHENYLEPHRINE HCL (PRESSORS) 10 MG/ML IV SOLN
INTRAVENOUS | Status: DC | PRN
  Administered 2022-11-23 (×4): 80 ug via INTRAVENOUS

## 2022-11-23 MED ORDER — LACTATED RINGERS IV SOLN: INTRAVENOUS | Status: DC

## 2022-11-23 MED ORDER — CEFAZOLIN SODIUM-DEXTROSE 2-4 GM/100ML-% IV SOLN
INTRAVENOUS | Status: AC
  Filled 2022-11-23: qty 100

## 2022-11-23 MED ORDER — ACETAMINOPHEN 10 MG/ML IV SOLN: 1000.0000 mg | Freq: Once | INTRAVENOUS | Status: DC | PRN

## 2022-11-23 MED ORDER — MIDAZOLAM HCL 2 MG/2ML IJ SOLN
INTRAMUSCULAR | Status: AC
  Filled 2022-11-23: qty 2

## 2022-11-23 MED ORDER — LIDOCAINE 2% (20 MG/ML) 5 ML SYRINGE
INTRAMUSCULAR | Status: DC | PRN
  Administered 2022-11-23: 60 mg via INTRAVENOUS

## 2022-11-23 MED ORDER — FENTANYL CITRATE (PF) 100 MCG/2ML IJ SOLN
25.0000 ug | INTRAMUSCULAR | Status: DC | PRN
  Administered 2022-11-23: 50 ug via INTRAVENOUS
  Administered 2022-11-23: 25 ug via INTRAVENOUS

## 2022-11-23 MED ORDER — SODIUM CHLORIDE 0.9 % IV SOLN
INTRAVENOUS | Status: AC
  Filled 2022-11-23: qty 10

## 2022-11-23 MED ORDER — DEXAMETHASONE SODIUM PHOSPHATE 4 MG/ML IJ SOLN
INTRAMUSCULAR | Status: DC | PRN
  Administered 2022-11-23: 4 mg via INTRAVENOUS

## 2022-11-23 MED ORDER — EPHEDRINE SULFATE (PRESSORS) 50 MG/ML IJ SOLN
INTRAMUSCULAR | Status: DC | PRN
  Administered 2022-11-23 (×2): 10 mg via INTRAVENOUS

## 2022-11-23 MED ORDER — CEFAZOLIN SODIUM-DEXTROSE 2-4 GM/100ML-% IV SOLN
2.0000 g | INTRAVENOUS | Status: AC
  Administered 2022-11-23: 2 g via INTRAVENOUS

## 2022-11-23 MED ORDER — BUPIVACAINE-EPINEPHRINE (PF) 0.25% -1:200000 IJ SOLN
INTRAMUSCULAR | Status: DC | PRN
  Administered 2022-11-23: 30 mL

## 2022-11-23 MED ORDER — ONDANSETRON HCL 4 MG/2ML IJ SOLN
INTRAMUSCULAR | Status: AC
  Filled 2022-11-23: qty 2

## 2022-11-23 MED ORDER — AMISULPRIDE (ANTIEMETIC) 5 MG/2ML IV SOLN: 10.0000 mg | Freq: Once | INTRAVENOUS | Status: DC | PRN

## 2022-11-23 MED ORDER — DEXAMETHASONE SODIUM PHOSPHATE 10 MG/ML IJ SOLN
INTRAMUSCULAR | Status: AC
  Filled 2022-11-23: qty 1

## 2022-11-23 MED ORDER — OXYCODONE HCL 5 MG PO TABS: 5.0000 mg | ORAL_TABLET | Freq: Four times a day (QID) | ORAL | 0 refills | Status: DC | PRN

## 2022-11-23 MED ORDER — PROPOFOL 10 MG/ML IV BOLUS
INTRAVENOUS | Status: DC | PRN
Start: 2022-11-23 — End: 2022-11-23
  Administered 2022-11-23: 200 mg via INTRAVENOUS

## 2022-11-23 MED ORDER — LIDOCAINE 2% (20 MG/ML) 5 ML SYRINGE
INTRAMUSCULAR | Status: AC
  Filled 2022-11-23: qty 5

## 2022-11-23 MED ORDER — MIDAZOLAM HCL 5 MG/5ML IJ SOLN
INTRAMUSCULAR | Status: DC | PRN
  Administered 2022-11-23: 1 mg via INTRAVENOUS

## 2022-11-23 MED ORDER — PROPOFOL 10 MG/ML IV BOLUS
INTRAVENOUS | Status: AC
  Filled 2022-11-23: qty 20

## 2022-11-23 MED ORDER — SODIUM CHLORIDE 0.9 % IV SOLN
INTRAVENOUS | Status: DC | PRN
  Administered 2022-11-23: 500 mL

## 2022-11-23 SURGICAL SUPPLY — 50 items
ADH SKN CLS APL DERMABOND .7 (GAUZE/BANDAGES/DRESSINGS) ×1
APL PRP STRL LF DISP 70% ISPRP (MISCELLANEOUS) ×1
APPLIER CLIP 9.375 MED OPEN (MISCELLANEOUS)
APR CLP MED 9.3 20 MLT OPN (MISCELLANEOUS)
BAG DECANTER FOR FLEXI CONT (MISCELLANEOUS) IMPLANT
BINDER BREAST LRG (GAUZE/BANDAGES/DRESSINGS) IMPLANT
BINDER BREAST MEDIUM (GAUZE/BANDAGES/DRESSINGS) IMPLANT
BINDER BREAST XLRG (GAUZE/BANDAGES/DRESSINGS) IMPLANT
BINDER BREAST XXLRG (GAUZE/BANDAGES/DRESSINGS) IMPLANT
BLADE SURG 15 STRL LF DISP TIS (BLADE) ×2 IMPLANT
BLADE SURG 15 STRL SS (BLADE) ×1
CANISTER SUC SOCK COL 7IN (MISCELLANEOUS) IMPLANT
CANISTER SUCT 1200ML W/VALVE (MISCELLANEOUS) IMPLANT
CHLORAPREP W/TINT 26 (MISCELLANEOUS) ×2 IMPLANT
CLIP APPLIE 9.375 MED OPEN (MISCELLANEOUS) IMPLANT
COVER BACK TABLE 60X90IN (DRAPES) ×2 IMPLANT
COVER MAYO STAND STRL (DRAPES) ×2 IMPLANT
COVER PROBE CYLINDRICAL 5X96 (MISCELLANEOUS) ×2 IMPLANT
DERMABOND ADVANCED .7 DNX12 (GAUZE/BANDAGES/DRESSINGS) ×2 IMPLANT
DRAPE LAPAROSCOPIC ABDOMINAL (DRAPES) IMPLANT
DRAPE LAPAROTOMY 100X72 PEDS (DRAPES) ×2 IMPLANT
DRAPE UTILITY XL STRL (DRAPES) ×2 IMPLANT
ELECT COATED BLADE 2.86 ST (ELECTRODE) ×2 IMPLANT
ELECT REM PT RETURN 9FT ADLT (ELECTROSURGICAL) ×1
ELECTRODE REM PT RTRN 9FT ADLT (ELECTROSURGICAL) ×2 IMPLANT
GLOVE BIOGEL PI IND STRL 8 (GLOVE) ×2 IMPLANT
GLOVE ECLIPSE 8.0 STRL XLNG CF (GLOVE) ×2 IMPLANT
GOWN STRL REUS W/ TWL LRG LVL3 (GOWN DISPOSABLE) ×4 IMPLANT
GOWN STRL REUS W/ TWL XL LVL3 (GOWN DISPOSABLE) ×2 IMPLANT
GOWN STRL REUS W/TWL LRG LVL3 (GOWN DISPOSABLE) ×2
GOWN STRL REUS W/TWL XL LVL3 (GOWN DISPOSABLE) ×1
HEMOSTAT ARISTA ABSORB 3G PWDR (HEMOSTASIS) IMPLANT
HEMOSTAT SNOW SURGICEL 2X4 (HEMOSTASIS) IMPLANT
KIT MARKER MARGIN INK (KITS) ×2 IMPLANT
NDL HYPO 25X1 1.5 SAFETY (NEEDLE) ×2 IMPLANT
NEEDLE HYPO 25X1 1.5 SAFETY (NEEDLE) ×1 IMPLANT
NS IRRIG 1000ML POUR BTL (IV SOLUTION) ×2 IMPLANT
PACK BASIN DAY SURGERY FS (CUSTOM PROCEDURE TRAY) ×2 IMPLANT
PENCIL SMOKE EVACUATOR (MISCELLANEOUS) ×2 IMPLANT
SLEEVE SCD COMPRESS KNEE MED (STOCKING) ×2 IMPLANT
SPIKE FLUID TRANSFER (MISCELLANEOUS) IMPLANT
SPONGE T-LAP 4X18 ~~LOC~~+RFID (SPONGE) ×2 IMPLANT
SUT MNCRL AB 4-0 PS2 18 (SUTURE) ×2 IMPLANT
SUT SILK 2 0 SH (SUTURE) IMPLANT
SUT VICRYL 3-0 CR8 SH (SUTURE) ×2 IMPLANT
SYR CONTROL 10ML LL (SYRINGE) ×2 IMPLANT
TOWEL GREEN STERILE FF (TOWEL DISPOSABLE) ×2 IMPLANT
TRAY FAXITRON CT DISP (TRAY / TRAY PROCEDURE) ×2 IMPLANT
TUBE CONNECTING 20X1/4 (TUBING) IMPLANT
YANKAUER SUCT BULB TIP NO VENT (SUCTIONS) IMPLANT

## 2022-11-23 NOTE — H&P (Signed)
History of Present Illness: [name] is a 67 y.o. female who is seen today as an office consultation for evaluation of NEW BREAST CANCER ( LEFT DCIS)  Patient presents for evaluation of newly diagnosed left breast DCIS. She had an area of calcifications measuring 4 cm that extended into the left nipple. Core biopsy showed cribriform DCIS. Grade 3. She has no history of previous breast cancer or family history of breast cancer. This is her first biopsy. She complains of bruising and swelling of the left breast.  Review of Systems: A complete review of systems was obtained from the patient. I have reviewed this information and discussed as appropriate with the patient. See HPI as well for other ROS.    Medical History: Past Medical History:  Diagnosis Date  Anemia  Anxiety  Asthma, unspecified asthma severity, unspecified whether complicated, unspecified whether persistent (HHS-HCC)  COPD (chronic obstructive pulmonary disease) (CMS/HHS-HCC)  Diabetes mellitus without complication (CMS/HHS-HCC)  GERD (gastroesophageal reflux disease)  Hyperlipidemia  Hypertension  Sleep apnea   There is no problem list on file for this patient.  Past Surgical History:  Procedure Laterality Date  BREAST BIOPSY Left 08/23/2022  HYSTERECTOMY  KNEE SURGERY Bilateral    Allergies  Allergen Reactions  Codeine Other (See Comments)  REACTION: itch  REACTION: itching  Tramadol-Acetaminophen Itching  REACTION: itch   Current Outpatient Medications on File Prior to Visit  Medication Sig Dispense Refill  albuterol MDI, PROVENTIL, VENTOLIN, PROAIR, HFA 90 mcg/actuation inhaler 2 puffs every 4 (four) hours as needed for wheezing or shortness of breath.  amLODIPine (NORVASC) 10 MG tablet Take 10 mg by mouth once daily  DULoxetine (CYMBALTA) 60 MG DR capsule Take 120 mg by mouth once daily  esomeprazole (NEXIUM) 40 MG DR capsule Take 40 mg by mouth once daily  FUROsemide (LASIX) 40 MG tablet Take  40 mg by mouth once daily  hydrALAZINE (APRESOLINE) 25 MG tablet  ipratropium-albuteroL (COMBIVENT RESPIMAT) 20-100 mcg/actuation inhaler 1 puff Inhalation Four times a day  isosorbide mononitrate (IMDUR) 60 MG ER tablet Take 60 mg by mouth  KLOR-CON M10 10 mEq ER tablet Take 10 mEq by mouth once daily  losartan (COZAAR) 100 MG tablet Take 100 mg by mouth once daily  metoclopramide (REGLAN) 10 MG tablet Take by mouth  metoprolol succinate (TOPROL-XL) 25 MG XL tablet Take 25 mg by mouth once daily  nitroGLYcerin (NITROSTAT) 0.4 MG SL tablet PLACE 1 TABLET UNDER THE TONGUE EVERY 5 MINUTES AS NEEDED.  rosuvastatin (CRESTOR) 20 MG tablet 1 tablet Orally Once a day  XARELTO 20 mg tablet Xarelto 20 mg tablet   No current facility-administered medications on file prior to visit.   History reviewed. No pertinent family history.   Social History   Tobacco Use  Smoking Status Never  Smokeless Tobacco Never    Social History   Socioeconomic History  Marital status: Single  Tobacco Use  Smoking status: Never  Smokeless tobacco: Never  Substance and Sexual Activity  Alcohol use: Yes  Drug use: Never   Objective:   Vitals:  09/02/22 1158 09/02/22 1205  BP: 120/74  Pulse: 82  Temp: 36.6 C (97.9 F)  SpO2: 97%  Weight: (!) 104.8 kg (231 lb)  Height: 162.6 cm (5\' 4" )  PainSc: 5  PainLoc: Breast   Body mass index is 39.65 kg/m.  Physical Exam Exam conducted with a chaperone present.  HENT:  Head: Normocephalic.  Cardiovascular:  Rate and Rhythm: Regular rhythm.  Pulmonary:  Effort: Pulmonary effort  is normal.  Chest:  Breasts: Right: Normal. No inverted nipple, mass or nipple discharge.  Left: Mass present. No inverted nipple or nipple discharge.   Comments: Bruising left breast  Musculoskeletal:  Cervical back: Normal range of motion.  Lymphadenopathy:  Upper Body:  Right upper body: No supraclavicular or axillary adenopathy.  Left upper body: No supraclavicular  or axillary adenopathy.  Skin: General: Skin is warm.  Neurological:  General: No focal deficit present.  Mental Status: She is alert.     Labs, Imaging and Diagnostic Testing:  Diagnosis 1. Breast, left, needle core biopsy, lateral posterior DUCTAL CARCINOMA IN SITU, CRIBRIFORM TYPE, GRADE 3 NECROSIS: PRESENT CALCIFICATIONS: PRESENT DCIS LENGTH: 1.6 CM SEE NOTE 2. Breast, left, needle core biopsy, lateral anterior DUCTAL CARCINOMA IN SITU, CRIBRIFORM AND SOLID TYPES, GRADE 3 NECROSIS: PRESENT CALCIFICATIONS: PRESENT DCIS LENGTH: 1.6 CM SEE NOTE NOTE: DR. PICKLESIMER REVIEWED THE CASE AND CONCURS WITH THE INTERPRETATION. A BREAST PROGNOSTIC PROFILE (ER AND PR) IS PENDING AND WILL BE REPORTED IN AN ADDENDUM. THE [facility] WAS NOTIFIED ON 08/24/2022. Isac Caddy MD [facility] Pathologist, Electronic Signature  CLINICAL DATA: Patient returns after screening study for evaluation of LEFT breast calcifications.  EXAM: DIGITAL DIAGNOSTIC UNILATERAL LEFT MAMMOGRAM WITH CAD  TECHNIQUE: Left digital diagnostic mammography was performed.  COMPARISON: Previous exam(s).  ACR Breast Density Category b: There are scattered areas of fibroglandular density.  FINDINGS: Magnified views are performed of calcifications in the central and LATERAL anterior aspect of the LEFT breast. These views demonstrate faint pleomorphic calcifications in a segmental distribution spanning at least 4.8 x 1.7 x 4.2 centimeters. Calcifications extend towards the nipple, and likely extend to just below the skin.  IMPRESSION: 4.8 centimeters span of indeterminate calcifications in the LEFT breast warranting tissue diagnosis.  RECOMMENDATION: Two-site stereotactic biopsy of LEFT breast calcifications, targeting anterior and posterior extent, if impossible.  I have discussed the findings and recommendations with the patient. If applicable, a reminder letter will be sent to the  patient regarding the next appointment.  BI-RADS CATEGORY 4: Suspicious.   Electronically Signed By: Norva Pavlov M.D. On: 08/15/2022 16:14    Assessment and Plan:   Diagnoses and all orders for this visit:  Ductal carcinoma in situ (DCIS) of left breast   Needs central lumpectomy to include the nipple   Discussed breast conserving surgery with mastectomy and reconstruction. She is opted for breast conserving surgery after reviewing the local regional recurrence rates, additional therapies, and complications of each. She will require central lumpectomy with resection of her nipple I talked with about that today. Risk and benefits of surgery reviewed she agrees to proceed.The procedure has been discussed with the patient. Alternatives to surgery have been discussed with the patient. Risks of surgery include bleeding, Infection, Seroma formation, death, and the need for further surgery. The patient understands and wishes to proceed.  Hayden Rasmussen, MD

## 2022-11-23 NOTE — Discharge Instructions (Addendum)
[facility] Office Phone Number [phone]  BREAST BIOPSY/ PARTIAL MASTECTOMY: POST OP INSTRUCTIONS  Always review your discharge instruction sheet given to you by the facility where your surgery was performed.  IF YOU HAVE DISABILITY OR FAMILY LEAVE FORMS, YOU MUST BRING THEM TO THE OFFICE FOR PROCESSING.  DO NOT GIVE THEM TO YOUR DOCTOR.  A prescription for pain medication may be given to you upon discharge.  Take your pain medication as prescribed, if needed.  If narcotic pain medicine is not needed, then you may take acetaminophen (Tylenol) or ibuprofen (Advil) as needed. Take your usually prescribed medications unless otherwise directed If you need a refill on your pain medication, please contact your pharmacy.  They will contact our office to request authorization.  Prescriptions will not be filled after 5pm or on week-ends. You should eat very light the first 24 hours after surgery, such as soup, crackers, pudding, etc.  Resume your normal diet the day after surgery. Most patients will experience some swelling and bruising in the breast.  Ice packs and a good support bra will help.  Swelling and bruising can take several days to resolve.  It is common to experience some constipation if taking pain medication after surgery.  Increasing fluid intake and taking a stool softener will usually help or prevent this problem from occurring.  A mild laxative (Milk of Magnesia or Miralax) should be taken according to package directions if there are no bowel movements after 48 hours. Unless discharge instructions indicate otherwise, you may remove your bandages 24-48 hours after surgery, and you may shower at that time.  You may have steri-strips (small skin tapes) in place directly over the incision.  These strips should be left on the skin for 7-10 days.  If your surgeon used skin glue on the incision, you may shower in 24 hours.  The glue will flake off over the next 2-3 weeks.  Any  sutures or staples will be removed at the office during your follow-up visit. ACTIVITIES:  You may resume regular daily activities (gradually increasing) beginning the next day.  Wearing a good support bra or sports bra minimizes pain and swelling.  You may have sexual intercourse when it is comfortable. You may drive when you no longer are taking prescription pain medication, you can comfortably wear a seatbelt, and you can safely maneuver your car and apply brakes. RETURN TO WORK:  ______________________________________________________________________________________ Bonita Quin should see your doctor in the office for a follow-up appointment approximately two weeks after your surgery.  Your doctor's nurse will typically make your follow-up appointment when she calls you with your pathology report.  Expect your pathology report 2-3 business days after your surgery.  You may call to check if you do not hear from [place] after three days. OTHER INSTRUCTIONS: _______________________________________________________________________________________________ _____________________________________________________________________________________________________________________________________ _____________________________________________________________________________________________________________________________________ _____________________________________________________________________________________________________________________________________  WHEN TO CALL YOUR DOCTOR: Fever over 101.0 Nausea and/or vomiting. Extreme swelling or bruising. Continued bleeding from incision. Increased pain, redness, or drainage from the incision.  The clinic staff is available to answer your questions during regular business hours.  Please don't hesitate to call and ask to speak to one of the nurses for clinical concerns.  If you have a medical emergency, go to the nearest emergency room or call 911.  A surgeon from [facility] is always on call at the hospital.  For further questions, please visit [email or web address]     RESTART BLOOD THINNER IN 48 HOURS     Post Anesthesia Home Care  Instructions  Activity: Get plenty of rest for the remainder of the day. A responsible individual must stay with you for 24 hours following the procedure.  For the next 24 hours, DO NOT: -Drive a car -Advertising copywriter -Drink alcoholic beverages -Take any medication unless instructed by your physician -Make any legal decisions or sign important papers.  Meals: Start with liquid foods such as gelatin or soup. Progress to regular foods as tolerated. Avoid greasy, spicy, heavy foods. If nausea and/or vomiting occur, drink only clear liquids until the nausea and/or vomiting subsides. Call your physician if vomiting continues.  Special Instructions/Symptoms: Your throat may feel dry or sore from the anesthesia or the breathing tube placed in your throat during surgery. If this causes discomfort, gargle with warm salt water. The discomfort should disappear within 24 hours.

## 2022-11-23 NOTE — Anesthesia Procedure Notes (Signed)
Procedure Name: LMA Insertion Date/Time: 11/23/2022 3:44 PM  Performed by: Burna Cash, CRNAPre-anesthesia Checklist: Patient identified, Emergency Drugs available, Suction available and Patient being monitored Patient Re-evaluated:Patient Re-evaluated prior to induction Oxygen Delivery Method: Circle system utilized Preoxygenation: Pre-oxygenation with 100% oxygen Induction Type: IV induction Ventilation: Mask ventilation without difficulty LMA: LMA inserted LMA Size: 4.0 Number of attempts: 1 Airway Equipment and Method: Bite block Placement Confirmation: positive ETCO2 Tube secured with: Tape Dental Injury: Teeth and Oropharynx as per pre-operative assessment

## 2022-11-23 NOTE — Op Note (Signed)
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  She presents for breast conserving surgery.  We reviewed the need to resect her nipple given her imaging findings we discussed trying to preserve that but this may require second operation.  The patient weighed her options and opted to have the nipple removed since the disease was centrally located up into the nipple.  She underwent seed placement as an outpatient.  We discussed the surgery as well as expected recovery as well as complications.The procedure has been discussed with the patient. Alternatives to surgery have been discussed with the patient.  Risks of surgery include bleeding,  Infection,  Seroma formation, death,  and the need for further surgery.   The patient understands and wishes to proceed.     Description of procedure: The patient was met in the holding area and questions were answered.  Left breast was marked as correct site.  Of note she underwent seed placement in the left as an outpatient.  Films were available for review.  Left side was marked as correct site of procedure was reviewed as well as the need to resect the nipple.  She was in agreement to proceed.  She was taken to the operating room.  She is placed upon upon the operative table.  After induction of general anesthesia, left breast was prepped and draped in sterile fashion.   Timeout performed.  Proper patient, site and procedure verified.  Neoprobe was used to identify both seeds which were in a subareolar position which the more lateral seed was more in the left lateral to upper outer quadrant.  A curvilinear incision was made above and below the nipple areolar complex.  Dissection was carried around this toward both seeds and all tissue was excised with a grossly negative margin.  This was in essence a central lumpectomy.  Images revealed both seeds and clips to be present.  Margins were grossly negative.  Tissue was oriented with ink and sent to pathology.  Local anesthetic infiltrated throughout.  Deep tissue planes approximated with 3-0 Vicryl.  4 Monocryl was used to close the skin in a subcuticular fashion.  Dermabond was applied.  Breast binder placed.  All counts found to be correct.  The patient was awoke extubated taken to recovery in satisfactory condition.

## 2022-11-23 NOTE — Transfer of Care (Signed)
Immediate Anesthesia Transfer of Care Note  Patient: [name]  Procedure(s) Performed: LEFT BREAST SEED BRACKETED CENTRAL LUMPECTOMY AND NIPPLE RESECTION (Left: Breast)  Patient Location: PACU  Anesthesia Type:General  Level of Consciousness: awake, alert , and oriented  Airway & Oxygen Therapy: Patient Spontanous Breathing and Patient connected to face mask oxygen  Post-op Assessment: Report given to RN and Post -op Vital signs reviewed and stable  Post vital signs: Reviewed and stable  Last Vitals:  Vitals Value Taken Time  BP    Temp    Pulse 75 11/23/22 1641  Resp 18 11/23/22 1641  SpO2 99 % 11/23/22 1641  Vitals shown include unfiled device data.  Last Pain:  Vitals:   11/23/22 1254  TempSrc: Oral  PainSc: 8       Patients Stated Pain Goal: 3 (11/23/22 1254)  Complications: No notable events documented.

## 2022-11-23 NOTE — Anesthesia Preprocedure Evaluation (Signed)
Anesthesia Evaluation  Patient identified by MRN, date of birth, ID band Patient awake    Reviewed: Allergy & Precautions, NPO status , Patient's Chart, lab work & pertinent test results  Airway Mallampati: II  TM Distance: >3 FB Neck ROM: Full    Dental  (+) Lower Dentures, Upper Dentures   Pulmonary asthma , sleep apnea , COPD,  COPD inhaler, Current SmokerPatient did not abstain from smoking.   Pulmonary exam normal        Cardiovascular hypertension, Pt. on medications and Pt. on home beta blockers + CAD and + DVT  Normal cardiovascular exam     Neuro/Psych negative neurological ROS  negative psych ROS   GI/Hepatic Neg liver ROS,GERD  Medicated and Controlled,,  Endo/Other  diabetes  Patient on GLP-1 Agonist  Renal/GU negative Renal ROS     Musculoskeletal  (+) Arthritis ,    Abdominal   Peds  Hematology  (+) Blood dyscrasia (Xarelto)   Anesthesia Other Findings LEFT DUCTAL CARCINOMA IN SITU  Reproductive/Obstetrics                             Anesthesia Physical Anesthesia Plan  ASA: 3  Anesthesia Plan: General   Post-op Pain Management:    Induction: Intravenous  PONV Risk Score and Plan: 2 and Ondansetron, Dexamethasone, Midazolam and Treatment may vary due to age or medical condition  Airway Management Planned: LMA  Additional Equipment:   Intra-op Plan:   Post-operative Plan: Extubation in OR  Informed Consent: I have reviewed the patients History and Physical, chart, labs and discussed the procedure including the risks, benefits and alternatives for the proposed anesthesia with the patient or authorized representative who has indicated his/her understanding and acceptance.     Dental advisory given  Plan Discussed with: CRNA  Anesthesia Plan Comments:        Anesthesia Quick Evaluation

## 2022-11-24 ENCOUNTER — Encounter (HOSPITAL_BASED_OUTPATIENT_CLINIC_OR_DEPARTMENT_OTHER): Payer: Self-pay | Admitting: Surgery

## 2022-11-24 NOTE — Anesthesia Postprocedure Evaluation (Signed)
Anesthesia Post Note  Patient: [name]  Procedure(s) Performed: LEFT BREAST SEED BRACKETED CENTRAL LUMPECTOMY AND NIPPLE RESECTION (Left: Breast)     Patient location during evaluation: PACU Anesthesia Type: General Level of consciousness: awake Pain management: pain level controlled Vital Signs Assessment: post-procedure vital signs reviewed and stable Respiratory status: spontaneous breathing, nonlabored ventilation and respiratory function stable Cardiovascular status: blood pressure returned to baseline and stable Postop Assessment: no apparent nausea or vomiting Anesthetic complications: no   No notable events documented.  Last Vitals:  Vitals:   11/23/22 1715 11/23/22 1737  BP: 111/61 116/65  Pulse: 75 77  Resp: 15 16  Temp:  (!) 36.3 C  SpO2: 96% 97%    Last Pain:  Vitals:   11/23/22 1737  TempSrc:   PainSc: 0-No pain                 Thoms Barthelemy P Charnise Lovan

## 2022-11-25 LAB — SURGICAL PATHOLOGY

## 2022-11-28 ENCOUNTER — Encounter: Payer: Self-pay | Admitting: Surgery

## 2022-11-29 ENCOUNTER — Encounter: Payer: Self-pay | Admitting: *Deleted

## 2022-12-07 ENCOUNTER — Other Ambulatory Visit: Payer: Self-pay | Admitting: Nurse Practitioner

## 2022-12-19 NOTE — Progress Notes (Signed)
Radiation Oncology         [phone]) [phone] ________________________________  Name: [name]        MRN: [id number]  Date of Service: 12/22/2022 DOB: July 30, 1955  [id number], Greggory Stallion, MD  Rachel Moulds, MD     REFERRING PHYSICIAN: Rachel Moulds, MD   DIAGNOSIS: The encounter diagnosis was Ductal carcinoma in situ (DCIS) of left breast.   HISTORY OF PRESENT ILLNESS: [name] is a 67 y.o. female with a diagnosis of a left breast cancer. The patient presented with screening detected calcifications by mammography and diagnostic imaging showed a grou of calcifications spanning up to 4.8 cm in the lateral and overlapping central left breast. A stereotactic biopsy on 08/23/22 showed high grade DCIS in the anterior and posterior biopsy both had calcifications and necrosis present and her cancer was ER/PR positive. Of note she's had genetic testing that showed VUS for CHEK2, MSH3, and RET. She will follow with genetics as needed.   Since her last visit, the patient underwent a left lumpectomy on 11/23/2022 which showed intermediate to high-grade DCIS measuring 4 cm in greatest dimension with associated necrosis.  Her margins were negative. She is seen to discuss adjuvant radiotherapy.   PREVIOUS RADIATION THERAPY: No   PAST MEDICAL HISTORY:  Past Medical History:  Diagnosis Date   Asthma    Breast cancer (HCC) 08/23/2022   Bronchitis, asthmatic    COPD (chronic obstructive pulmonary disease) (HCC)    Coronary artery calcification seen on CT scan    Diabetes (HCC)    DVT (deep venous thrombosis) (HCC)    Family history of kidney cancer    Family history of ovarian cancer    Family history of prostate cancer    Hyperlipidemia    Hypertension    Observed sleep apnea        PAST SURGICAL HISTORY: Past Surgical History:  Procedure Laterality Date   ABDOMINAL HYSTERECTOMY     BREAST BIOPSY Left 08/23/2022   MM LT BREAST BX W LOC DEV 1ST LESION IMAGE BX SPEC STEREO  GUIDE 08/23/2022 [facility]   BREAST BIOPSY Left 08/23/2022   MM LT BREAST BX W LOC DEV EA AD LESION IMG BX SPEC STEREO GUIDE 08/23/2022 [facility]   BREAST BIOPSY  11/22/2022   MM LT RADIOACTIVE SEED EA ADD LESION LOC MAMMO GUIDE 11/22/2022 [facility]   BREAST BIOPSY  11/22/2022   MM LT RADIOACTIVE SEED LOC MAMMO GUIDE 11/22/2022 [facility]   BREAST LUMPECTOMY WITH RADIOACTIVE SEED LOCALIZATION Left 11/23/2022   Procedure: LEFT BREAST SEED BRACKETED CENTRAL LUMPECTOMY AND NIPPLE RESECTION;  Surgeon: Harriette Bouillon, MD;  Location:  [facility];  Service: General;  Laterality: Left;   KNEE ARTHROSCOPY Bilateral    TOTAL KNEE ARTHROPLASTY Bilateral      FAMILY HISTORY:  Family History  Problem Relation Age of Onset   Pulmonary embolism Mother    Heart Problems Mother    Hypertension Mother    Heart attack Father    Diabetes Father    Pulmonary embolism Sister    Healthy Sister    Pulmonary embolism Brother    Prostate cancer Brother 12   Kidney cancer Brother 27   Pulmonary embolism Brother    Ovarian cancer Maternal Aunt      SOCIAL HISTORY:  reports that she has been smoking cigarettes. She started smoking about 58 years ago. She has a 87.8 pack-year smoking history. She has been exposed to tobacco smoke. She has never used smokeless  tobacco. She reports that she does not currently use alcohol. She reports that she does not currently use drugs after having used the following drugs: Marijuana and "Crack" cocaine. The patient is single and lives in [place]. 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   ALLERGIES: Codeine, Codeine phosphate, and Tramadol-acetaminophen   MEDICATIONS:  Current Outpatient Medications  Medication Sig Dispense Refill   amLODipine (NORVASC) 10 MG tablet amlodipine 10 mg tablet     celecoxib (CELEBREX) 400 MG capsule Take 400 mg by mouth 2 (two) times daily.     cyclobenzaprine (FLEXERIL) 10 MG  tablet Take 5-10 mg by mouth 3 (three) times daily as needed for muscle spasms.     diclofenac Sodium (VOLTAREN) 1 % GEL Apply 1 application topically 4 (four) times daily.     DULoxetine (CYMBALTA) 60 MG capsule Take 120 mg by mouth daily.     esomeprazole (NEXIUM) 40 MG capsule esomeprazole magnesium 40 mg capsule,delayed release  TAKE 1 CAPSULE BY MOUTH EVERY DAY     furosemide (LASIX) 40 MG tablet Take 40 mg by mouth daily.     hydrALAZINE (APRESOLINE) 25 MG tablet Take 25 mg by mouth daily.     HYDROcodone-acetaminophen (NORCO/VICODIN) 5-325 MG tablet Take 1 tablet by mouth 2 (two) times daily as needed.     isosorbide mononitrate (IMDUR) 60 MG 24 hr tablet Take 1 tablet (60 mg total) by mouth daily. Please contact office to schedule an appointment . 30 tablet 0   lidocaine (LIDODERM) 5 % 1 patch every 12 (twelve) hours.     losartan (COZAAR) 100 MG tablet Take 100 mg by mouth daily.     metoprolol succinate (TOPROL-XL) 25 MG 24 hr tablet Take 1 tablet (25 mg total) by mouth daily. 30 tablet 1   MOUNJARO 5 MG/0.5ML Pen Inject 5 mg into the skin once a week.     Multiple Vitamins-Minerals (CENTRUM ADULTS PO) Take by mouth daily.     nitroGLYCERIN (NITROSTAT) 0.4 MG SL tablet Place 1 tablet (0.4 mg total) under the tongue every 5 (five) minutes as needed. 30 tablet 3   nortriptyline (PAMELOR) 50 MG capsule Take by mouth at bedtime as needed.     ondansetron (ZOFRAN) 4 MG tablet Take 4 mg by mouth every 8 (eight) hours as needed for nausea.     oxyCODONE (OXY IR/ROXICODONE) 5 MG immediate release tablet Take 1 tablet (5 mg total) by mouth every 6 (six) hours as needed for severe pain. 15 tablet 0   potassium chloride (KLOR-CON M) 10 MEQ tablet 10 mEq daily as needed (When taking lasix for edema).     rivaroxaban (XARELTO) 20 MG TABS tablet 20 mg daily with supper.     rosuvastatin (CRESTOR) 20 MG tablet Take 20 mg by mouth daily.     Tiotropium Bromide-Olodaterol (STIOLTO RESPIMAT) 2.5-2.5  MCG/ACT AERS INHALE 2 PUFFS BY MOUTH INTO THE LUNGS DAILY 4 g 0   Vitamin D, Ergocalciferol, (DRISDOL) 1.25 MG (50000 UNIT) CAPS capsule Take 50,000 Units by mouth once a week.     No current facility-administered medications for this visit.     REVIEW OF SYSTEMS: On review of systems, the patient reports that she ***     PHYSICAL EXAM:  Wt Readings from Last 3 Encounters:  11/23/22 228 lb 6.3 oz (103.6 kg)  09/13/22 229 lb 6.4 oz (104.1 kg)  09/13/22 229 lb 6.4 oz (104.1 kg)   Temp Readings from Last 3 Encounters:  11/23/22 (!) 97.3 F (36.3 C)  09/13/22 98.2 F (36.8 C)  09/13/22 98.2 F (36.8 C)   BP Readings from Last 3 Encounters:  11/23/22 116/65  09/13/22 (!) 157/78  09/13/22 (!) 157/78   Pulse Readings from Last 3 Encounters:  11/23/22 77  09/13/22 85  09/13/22 85    In general this is a well appearing African American female in no acute distress. She's alert and oriented x4 and appropriate throughout the examination. Cardiopulmonary assessment is negative for acute distress and she exhibits normal effort.  Left breast reveals a well-healed surgical incision site without erythema, separation or drainage.   ECOG = 1  0 - Asymptomatic (Fully active, able to carry on all predisease activities without restriction)  1 - Symptomatic but completely ambulatory (Restricted in physically strenuous activity but ambulatory and able to carry out work of a light or sedentary nature. For example, light housework, office work)  2 - Symptomatic, <50% in bed during the day (Ambulatory and capable of all self care but unable to carry out any work activities. Up and about more than 50% of waking hours)  3 - Symptomatic, >50% in bed, but not bedbound (Capable of only limited self-care, confined to bed or chair 50% or more of waking hours)  4 - Bedbound (Completely disabled. Cannot carry on any self-care. Totally confined to bed or chair)  5 - Death   Santiago Glad MM, Creech RH, Tormey  DC, et al. [phone]). "Toxicity and response criteria of the [facility]". Am. Evlyn Clines. Oncol. 5 (6): 649-55    LABORATORY DATA:  Lab Results  Component Value Date   WBC 4.0 08/31/2022   HGB 12.4 08/31/2022   HCT 38.4 08/31/2022   MCV 93.2 08/31/2022   PLT 208 08/31/2022   Lab Results  Component Value Date   NA 140 11/18/2022   K 4.6 11/18/2022   CL 108 11/18/2022   CO2 26 11/18/2022   Lab Results  Component Value Date   ALT 31 08/31/2022   AST 35 08/31/2022   ALKPHOS 41 08/31/2022   BILITOT 0.3 08/31/2022      RADIOGRAPHY: MM Breast Surgical Specimen  Result Date: 11/23/2022 CLINICAL DATA:  Status post bracketed seed localization of the LEFT breast. EXAM: SPECIMEN RADIOGRAPH OF THE LEFT BREAST COMPARISON:  Previous exam(s). FINDINGS: Status post excision of the left breast. Two radioactive seeds, a ribbon shaped clip, and a coil shaped clip are present and intact. The findings are discussed with the operating room nurse at the time of interpretation. IMPRESSION: Specimen radiograph of the left breast. Electronically Signed   By: Norva Pavlov M.D.   On: 11/23/2022 16:16   MM LT RADIOACTIVE SEED LOC MAMMO GUIDE  Result Date: 11/22/2022 CLINICAL DATA:  Patient presents for seed localization of 2 sites in the LEFT breast. Recent stereotactic biopsies showed ductal carcinoma in situ. EXAM: MAMMOGRAPHIC GUIDED RADIOACTIVE SEED LOCALIZATION OF THE LEFT BREAST x2 COMPARISON:  Previous exam(s). FINDINGS: Patient presents for radioactive seed localization prior to bracketed lumpectomy. I met with the patient and we discussed the procedure of seed localization including benefits and alternatives. We discussed the high likelihood of a successful procedure. We discussed the risks of the procedure including infection, bleeding, tissue injury and further surgery. We discussed the low dose of radioactivity involved in the procedure. Informed, written consent was given. The  usual time-out protocol was performed immediately prior to the procedure. Site1: Using mammographic guidance, sterile technique, 1% lidocaine and an I-125  radioactive seed, the coil shaped was localized using a LATERAL approach. The follow-up mammogram images confirm the seed in the expected location and were marked for Dr. Luisa Hart. Follow-up survey of the patient confirms presence of the radioactive seed. Order number of I-125 seed:  [id number]. Total activity:  0.260 millicuries reference Date: 10/21/2022 Site 2: Following biopsy on 08/23/2022, the ribbon shaped clip was noted to have migrated 1.8 centimeters MEDIAL. On imaging at the time of seed placement, numerous calcifications are present at the ribbon shaped clip. Therefore, the seed was placed adjacent to the ribbon shaped clip. Using mammographic guidance, sterile technique, 1% lidocaine and an I-125 radioactive seed, the calcifications and ribbon shaped clip were localized using a LATERAL approach. The follow-up mammogram images confirm the seed in the expected location and were marked for Dr. Luisa Hart. Follow-up survey of the patient confirms presence of the radioactive seed. Order number of I-125 seed:  [id number]. Total activity:  0.253 millicuries reference Date: 10/21/2022 The patient tolerated the procedure well and was [facility] from the [facility]. She was given instructions regarding seed removal. IMPRESSION: Bracketed radioactive seed localization of the LEFT breast. No apparent complications. Electronically Signed   By: Norva Pavlov M.D.   On: 11/22/2022 15:11   MM LT RAD SEED EA ADD LESION LOC MAMMO  Result Date: 11/22/2022 CLINICAL DATA:  Patient presents for seed localization of 2 sites in the LEFT breast. Recent stereotactic biopsies showed ductal carcinoma in situ. EXAM: MAMMOGRAPHIC GUIDED RADIOACTIVE SEED LOCALIZATION OF THE LEFT BREAST x2 COMPARISON:  Previous exam(s). FINDINGS: Patient presents for radioactive seed localization  prior to bracketed lumpectomy. I met with the patient and we discussed the procedure of seed localization including benefits and alternatives. We discussed the high likelihood of a successful procedure. We discussed the risks of the procedure including infection, bleeding, tissue injury and further surgery. We discussed the low dose of radioactivity involved in the procedure. Informed, written consent was given. The usual time-out protocol was performed immediately prior to the procedure. Site1: Using mammographic guidance, sterile technique, 1% lidocaine and an I-125 radioactive seed, the coil shaped was localized using a LATERAL approach. The follow-up mammogram images confirm the seed in the expected location and were marked for Dr. Luisa Hart. Follow-up survey of the patient confirms presence of the radioactive seed. Order number of I-125 seed:  [id number]. Total activity:  0.260 millicuries reference Date: 10/21/2022 Site 2: Following biopsy on 08/23/2022, the ribbon shaped clip was noted to have migrated 1.8 centimeters MEDIAL. On imaging at the time of seed placement, numerous calcifications are present at the ribbon shaped clip. Therefore, the seed was placed adjacent to the ribbon shaped clip. Using mammographic guidance, sterile technique, 1% lidocaine and an I-125 radioactive seed, the calcifications and ribbon shaped clip were localized using a LATERAL approach. The follow-up mammogram images confirm the seed in the expected location and were marked for Dr. Luisa Hart. Follow-up survey of the patient confirms presence of the radioactive seed. Order number of I-125 seed:  [id number]. Total activity:  0.253 millicuries reference Date: 10/21/2022 The patient tolerated the procedure well and was released from the [facility]. She was given instructions regarding seed removal. IMPRESSION: Bracketed radioactive seed localization of the LEFT breast. No apparent complications. Electronically Signed   By: Norva Pavlov M.D.   On: 11/22/2022 15:11       IMPRESSION/PLAN: 1. Intermediate to High grade, ER/PR positive DCIS of the left breast. Dr. Mitzi Hansen has reviewed the final pathology findings and today  the patient and I discussed the nature of early stage left breast disease.  She has done well surgically.  Dr. Mitzi Hansen recommends external radiotherapy to the breast  to reduce risks of local recurrence followed by antiestrogen therapy. We discussed the risks, benefits, short, and long term effects of radiotherapy, as well as the curative intent, and the patient is interested in proceeding.  We reviewed the delivery and logistics of radiotherapy and that Dr. Mitzi Hansen recommends 4 weeks of radiotherapy to the left breast with deep inspiration breath hold technique. Written consent is obtained and placed in the chart, a copy was provided to the patient. She will simulate today.     In a visit lasting *** minutes, greater than 50% of the time was spent face to face reviewing her case, as well as in preparation of, discussing, and coordinating the patient's care.      Osker Mason, [facility]    **Disclaimer: This note was dictated with voice recognition software. Similar sounding words can inadvertently be transcribed and this note may contain transcription errors which may not have been corrected upon publication of note.**

## 2022-12-22 ENCOUNTER — Other Ambulatory Visit: Payer: Self-pay

## 2022-12-22 ENCOUNTER — Ambulatory Visit
Admission: RE | Admit: 2022-12-22 | Discharge: 2022-12-22 | Disposition: A | Discharge status: Home / Self Care | Payer: 59 | Source: Ambulatory Visit | Attending: Radiation Oncology | Admitting: Radiation Oncology

## 2022-12-22 ENCOUNTER — Encounter: Payer: Self-pay | Admitting: Radiation Oncology

## 2022-12-22 VITALS — BP 125/74 | HR 85 | Temp 97.8°F | Resp 20 | Ht 64.0 in | Wt 233.6 lb

## 2022-12-22 DIAGNOSIS — J4489 Other specified chronic obstructive pulmonary disease: Secondary | ICD-10-CM | POA: Diagnosis not present

## 2022-12-22 DIAGNOSIS — D0512 Intraductal carcinoma in situ of left breast: Secondary | ICD-10-CM | POA: Diagnosis present

## 2022-12-22 DIAGNOSIS — Z79899 Other long term (current) drug therapy: Secondary | ICD-10-CM | POA: Diagnosis not present

## 2022-12-22 DIAGNOSIS — Z86718 Personal history of other venous thrombosis and embolism: Secondary | ICD-10-CM | POA: Diagnosis not present

## 2022-12-22 DIAGNOSIS — E119 Type 2 diabetes mellitus without complications: Secondary | ICD-10-CM | POA: Insufficient documentation

## 2022-12-22 DIAGNOSIS — Z170 Estrogen receptor positive status [ER+]: Secondary | ICD-10-CM | POA: Insufficient documentation

## 2022-12-22 DIAGNOSIS — F17210 Nicotine dependence, cigarettes, uncomplicated: Secondary | ICD-10-CM | POA: Diagnosis not present

## 2022-12-22 DIAGNOSIS — G4730 Sleep apnea, unspecified: Secondary | ICD-10-CM | POA: Insufficient documentation

## 2022-12-22 DIAGNOSIS — Z8041 Family history of malignant neoplasm of ovary: Secondary | ICD-10-CM | POA: Insufficient documentation

## 2022-12-22 DIAGNOSIS — Z8042 Family history of malignant neoplasm of prostate: Secondary | ICD-10-CM | POA: Diagnosis not present

## 2022-12-22 DIAGNOSIS — Z791 Long term (current) use of non-steroidal anti-inflammatories (NSAID): Secondary | ICD-10-CM | POA: Diagnosis not present

## 2022-12-22 DIAGNOSIS — J449 Chronic obstructive pulmonary disease, unspecified: Secondary | ICD-10-CM | POA: Diagnosis not present

## 2022-12-22 DIAGNOSIS — I2510 Atherosclerotic heart disease of native coronary artery without angina pectoris: Secondary | ICD-10-CM | POA: Diagnosis not present

## 2022-12-22 DIAGNOSIS — E785 Hyperlipidemia, unspecified: Secondary | ICD-10-CM | POA: Insufficient documentation

## 2022-12-22 DIAGNOSIS — Z8051 Family history of malignant neoplasm of kidney: Secondary | ICD-10-CM | POA: Insufficient documentation

## 2022-12-22 DIAGNOSIS — I10 Essential (primary) hypertension: Secondary | ICD-10-CM | POA: Diagnosis not present

## 2022-12-22 NOTE — Progress Notes (Signed)
Nursing interview for Ductal carcinoma in situ (DCIS) of left breast. Patient identity verified x2.  Patient reports LT breast tenderness 4/10, and a moderate sized knot at incision line, but healing well. No other issues conveyed at this time.  Meaningful use complete.  Vitals- BP 125/74 (BP Location: Right Arm, Patient Position: Sitting, Cuff Size: Large)   Pulse 85   Temp 97.8 F (36.6 C) (Temporal)   Resp 20   Ht 5\' 4"  (1.626 m)   Wt 233 lb 9.6 oz (106 kg)   SpO2 99%   BMI 40.10 kg/m   This concludes the interaction.  Ruel Favors, LPN

## 2022-12-27 ENCOUNTER — Encounter: Payer: Self-pay | Admitting: *Deleted

## 2022-12-27 DIAGNOSIS — D0512 Intraductal carcinoma in situ of left breast: Secondary | ICD-10-CM

## 2022-12-29 ENCOUNTER — Inpatient Hospital Stay: Payer: 59 | Attending: Hematology and Oncology | Admitting: Hematology and Oncology

## 2022-12-29 VITALS — BP 159/72 | HR 97 | Temp 97.8°F | Resp 18 | Ht 64.0 in | Wt 233.3 lb

## 2022-12-29 DIAGNOSIS — D0512 Intraductal carcinoma in situ of left breast: Secondary | ICD-10-CM | POA: Insufficient documentation

## 2022-12-29 DIAGNOSIS — Z825 Family history of asthma and other chronic lower respiratory diseases: Secondary | ICD-10-CM | POA: Insufficient documentation

## 2022-12-29 DIAGNOSIS — Z86711 Personal history of pulmonary embolism: Secondary | ICD-10-CM | POA: Insufficient documentation

## 2022-12-29 DIAGNOSIS — Z885 Allergy status to narcotic agent status: Secondary | ICD-10-CM | POA: Insufficient documentation

## 2022-12-29 DIAGNOSIS — M549 Dorsalgia, unspecified: Secondary | ICD-10-CM | POA: Insufficient documentation

## 2022-12-29 DIAGNOSIS — I2510 Atherosclerotic heart disease of native coronary artery without angina pectoris: Secondary | ICD-10-CM | POA: Insufficient documentation

## 2022-12-29 DIAGNOSIS — Z90710 Acquired absence of both cervix and uterus: Secondary | ICD-10-CM | POA: Insufficient documentation

## 2022-12-29 DIAGNOSIS — Z7901 Long term (current) use of anticoagulants: Secondary | ICD-10-CM | POA: Diagnosis not present

## 2022-12-29 DIAGNOSIS — I10 Essential (primary) hypertension: Secondary | ICD-10-CM | POA: Diagnosis not present

## 2022-12-29 DIAGNOSIS — Z833 Family history of diabetes mellitus: Secondary | ICD-10-CM | POA: Insufficient documentation

## 2022-12-29 DIAGNOSIS — Z8041 Family history of malignant neoplasm of ovary: Secondary | ICD-10-CM | POA: Diagnosis not present

## 2022-12-29 DIAGNOSIS — J4489 Other specified chronic obstructive pulmonary disease: Secondary | ICD-10-CM | POA: Insufficient documentation

## 2022-12-29 DIAGNOSIS — Z8051 Family history of malignant neoplasm of kidney: Secondary | ICD-10-CM | POA: Diagnosis not present

## 2022-12-29 DIAGNOSIS — Z8042 Family history of malignant neoplasm of prostate: Secondary | ICD-10-CM | POA: Diagnosis not present

## 2022-12-29 DIAGNOSIS — Z8249 Family history of ischemic heart disease and other diseases of the circulatory system: Secondary | ICD-10-CM | POA: Diagnosis not present

## 2022-12-29 DIAGNOSIS — F17210 Nicotine dependence, cigarettes, uncomplicated: Secondary | ICD-10-CM | POA: Diagnosis not present

## 2022-12-29 DIAGNOSIS — Z79899 Other long term (current) drug therapy: Secondary | ICD-10-CM | POA: Insufficient documentation

## 2022-12-29 DIAGNOSIS — E119 Type 2 diabetes mellitus without complications: Secondary | ICD-10-CM | POA: Diagnosis not present

## 2022-12-29 DIAGNOSIS — Z86718 Personal history of other venous thrombosis and embolism: Secondary | ICD-10-CM | POA: Diagnosis not present

## 2022-12-29 NOTE — Progress Notes (Signed)
[facility] CONSULT NOTE  Patient Care Team: Jackie Plum, MD as PCP - General (Internal Medicine) Dulce Sellar Iline Oven, MD as Consulting Physician (Cardiology) Donnelly Angelica, RN as Oncology Nurse Navigator Pershing Proud, RN as Oncology Nurse Navigator Harriette Bouillon, MD as Consulting Physician (General Surgery) Dorothy Puffer, MD as Consulting Physician (Radiation Oncology) Rachel Moulds, MD as Consulting Physician (Hematology and Oncology)  CHIEF COMPLAINTS/PURPOSE OF CONSULTATION:  DCIS  ASSESSMENT & PLAN:  No problem-specific Assessment & Plan notes found for this encounter.   No orders of the defined types were placed in this encounter.    HISTORY OF PRESENTING ILLNESS:  [name] 67 y.o. female is here because of DCIS  Oncology History  Ductal carcinoma in situ (DCIS) of left breast  07/04/2022 Mammogram   Screening mammogram suggested further evaluation for calcifications in the left breast.  She then had a diagnostic mammogram which showed 4.8 cm span of indeterminate calcifications in the left breast warranting tissue diagnosis.   08/23/2022 Pathology Results   Left breast needle core biopsy showed grade 3 DCIS, necrosis and calcifications present, prognostic showed ER 100% positive strong staining PR 60% positive moderate to strong staining   08/29/2022 Initial Diagnosis   Ductal carcinoma in situ (DCIS) of left breast   09/06/2022 Genetic Testing   Negative genetic testing on the Multi-Cancer+RNA gene panel.  CHEK2 c.164C>T (p.Ser55Phe), CHEK2 c.542G>A (p.Arg181His), MSH3 c.2600T>C (p.Ile867Thr), and RET c.2611G>A (p.Val871Ile) VUS were identified.  The report date is September 06, 2022.  The Multi-Cancer + RNA Panel offered by [facility] includes sequencing and/or deletion/duplication analysis of the following 70 genes:  AIP*, ALK, APC*, ATM*, AXIN2*, BAP1*, BARD1*, BLM*, BMPR1A*, BRCA1*, BRCA2*, BRIP1*, CDC73*, CDH1*, CDK4, CDKN1B*, CDKN2A,  CHEK2*, CTNNA1*, DICER1*, EPCAM (del/dup only), EGFR, FH*, FLCN*, GREM1 (promoter dup only), HOXB13, KIT, LZTR1, MAX*, MBD4, MEN1*, MET, MITF, MLH1*, MSH2*, MSH3*, MSH6*, MUTYH*, NF1*, NF2*, NTHL1*, PALB2*, PDGFRA, PMS2*, POLD1*, POLE*, POT1*, PRKAR1A*, PTCH1*, PTEN*, RAD51C*, RAD51D*, RB1*, RET, SDHA* (sequencing only), SDHAF2*, SDHB*, SDHC*, SDHD*, SMAD4*, SMARCA4*, SMARCB1*, SMARCE1*, STK11*, SUFU*, TMEM127*, TP53*, TSC1*, TSC2*, VHL*. RNA analysis is performed for * genes.     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  No history of breast cancer in the family however several other cancers run in the family.  Maternal aunt had ovarian cancer, sister had renal cancer, dad had metastatic carcinoma of unknown primary.  She has 2 children, 2 boys.  No history of hormone replacement therapy.  She complains of occasional back pain for which she takes prednisone, otherwise significant history of DVT for which she stayed on Coumadin and PE subsequently hence she was recommended indefinite anticoagulation.  There is also strong family history of blood clots.  Sister had saddle pulmonary embolism.    Since her last visit here she had left breast lumpectomy which showed DCIS, cribriform and solid intermediate to high nuclear grade with necrosis, DCIS measured 4 cm, negative margins, ER/PR positive.  ER 100% positive strong staining PR 40% positive moderate to strong staining.  She has healed very well from surgery.  She denies any major complaints except for very intermittent pain in the area.  She is very pleased.  She will start radiation first week of September and will go on till first week of October.  She is understandably not excited to go on the antiestrogen  therapy.  She today also reports she may have osteoporosis but she is not on any treatment for it.  She is not very active has not  been exercising.  Apparently most of her family members who are with her last visit are unfortunately hospitalized for various reasons.  She is trying to recover through everything.  Rest of the pertinent 10 point ROS reviewed and negative  MEDICAL HISTORY:  Past Medical History:  Diagnosis Date   Asthma    Breast cancer (HCC) 08/23/2022   Bronchitis, asthmatic    COPD (chronic obstructive pulmonary disease) (HCC)    Coronary artery calcification seen on CT scan    Diabetes (HCC)    DVT (deep venous thrombosis) (HCC)    Family history of kidney cancer    Family history of ovarian cancer    Family history of prostate cancer    Hyperlipidemia    Hypertension    Observed sleep apnea     SURGICAL HISTORY: Past Surgical History:  Procedure Laterality Date   ABDOMINAL HYSTERECTOMY     BREAST BIOPSY Left 08/23/2022   MM LT BREAST BX W LOC DEV 1ST LESION IMAGE BX SPEC STEREO GUIDE 08/23/2022 [facility]   BREAST BIOPSY Left 08/23/2022   MM LT BREAST BX W LOC DEV EA AD LESION IMG BX SPEC STEREO GUIDE 08/23/2022 [facility]   BREAST BIOPSY  11/22/2022   MM LT RADIOACTIVE SEED EA ADD LESION LOC MAMMO GUIDE 11/22/2022 [facility]   BREAST BIOPSY  11/22/2022   MM LT RADIOACTIVE SEED LOC MAMMO GUIDE 11/22/2022 [facility]   BREAST LUMPECTOMY WITH RADIOACTIVE SEED LOCALIZATION Left 11/23/2022   Procedure: LEFT BREAST SEED BRACKETED CENTRAL LUMPECTOMY AND NIPPLE RESECTION;  Surgeon: Harriette Bouillon, MD;  Location: [facility];  Service: General;  Laterality: Left;   KNEE ARTHROSCOPY Bilateral    TOTAL KNEE ARTHROPLASTY Bilateral     SOCIAL HISTORY: Social History   Socioeconomic History   Marital status: Single    Spouse name: Not on file   Number of children: Not on file   Years of education: Not on file   Highest education level: Not on file  Occupational History   Not on file  Tobacco Use   Smoking status: Every Day    Current packs/day: 1.50     Average packs/day: 1.5 packs/day for 58.6 years (87.8 ttl pk-yrs)    Types: Cigarettes    Start date: 06/07/1964    Passive exposure: Current   Smokeless tobacco: Never  Vaping Use   Vaping status: Never Used  Substance and Sexual Activity   Alcohol use: Not Currently   Drug use: Not Currently    Types: Marijuana, "Crack" cocaine    Comment: None since 1999   Sexual activity: Not on file  Other Topics Concern   Not on file  Social History Narrative   Not on file   Social Determinants of Health   Financial Resource Strain: Not on file  Food Insecurity: Food Insecurity Present (09/13/2022)   Hunger Vital Sign    Worried About Running Out of Food in the Last Year: Never true    Ran Out of Food in the Last Year: Sometimes true  Transportation Needs: No Transportation Needs (09/13/2022)   PRAPARE - Administrator, Civil Service (Medical): No    Lack of Transportation (Non-Medical): No  Physical Activity: Not on file  Stress: Not on file  Social Connections: Not on file  Intimate Partner Violence: Not At  Risk (09/13/2022)   Humiliation, Afraid, Rape, and Kick questionnaire    Fear of Current or Ex-Partner: No    Emotionally Abused: No    Physically Abused: No    Sexually Abused: No    FAMILY HISTORY: Family History  Problem Relation Age of Onset   Pulmonary embolism Mother    Heart Problems Mother    Hypertension Mother    Heart attack Father    Diabetes Father    Pulmonary embolism Sister    Healthy Sister    Pulmonary embolism Brother    Prostate cancer Brother 84   Kidney cancer Brother 8   Pulmonary embolism Brother    Ovarian cancer Maternal Aunt     ALLERGIES:  is allergic to codeine, codeine phosphate, and tramadol-acetaminophen.  MEDICATIONS:  Current Outpatient Medications  Medication Sig Dispense Refill   amLODipine (NORVASC) 10 MG tablet amlodipine 10 mg tablet     celecoxib (CELEBREX) 400 MG capsule Take 400 mg by mouth 2 (two) times  daily.     cyclobenzaprine (FLEXERIL) 10 MG tablet Take 5-10 mg by mouth 3 (three) times daily as needed for muscle spasms.     diclofenac Sodium (VOLTAREN) 1 % GEL Apply 1 application topically 4 (four) times daily.     DULoxetine (CYMBALTA) 60 MG capsule Take 120 mg by mouth daily.     esomeprazole (NEXIUM) 40 MG capsule esomeprazole magnesium 40 mg capsule,delayed release  TAKE 1 CAPSULE BY MOUTH EVERY DAY     furosemide (LASIX) 40 MG tablet Take 40 mg by mouth daily.     hydrALAZINE (APRESOLINE) 25 MG tablet Take 25 mg by mouth daily.     HYDROcodone-acetaminophen (NORCO/VICODIN) 5-325 MG tablet Take 1 tablet by mouth 2 (two) times daily as needed.     isosorbide mononitrate (IMDUR) 60 MG 24 hr tablet Take 1 tablet (60 mg total) by mouth daily. Please contact office to schedule an appointment . 30 tablet 0   lidocaine (LIDODERM) 5 % 1 patch every 12 (twelve) hours.     losartan (COZAAR) 100 MG tablet Take 100 mg by mouth daily.     metoprolol succinate (TOPROL-XL) 25 MG 24 hr tablet Take 1 tablet (25 mg total) by mouth daily. 30 tablet 1   MOUNJARO 5 MG/0.5ML Pen Inject 5 mg into the skin once a week.     Multiple Vitamins-Minerals (CENTRUM ADULTS PO) Take by mouth daily.     nitroGLYCERIN (NITROSTAT) 0.4 MG SL tablet Place 1 tablet (0.4 mg total) under the tongue every 5 (five) minutes as needed. 30 tablet 3   nortriptyline (PAMELOR) 50 MG capsule Take by mouth at bedtime as needed.     ondansetron (ZOFRAN) 4 MG tablet Take 4 mg by mouth every 8 (eight) hours as needed for nausea.     oxyCODONE (OXY IR/ROXICODONE) 5 MG immediate release tablet Take 1 tablet (5 mg total) by mouth every 6 (six) hours as needed for severe pain. 15 tablet 0   potassium chloride (KLOR-CON M) 10 MEQ tablet 10 mEq daily as needed (When taking lasix for edema).     rivaroxaban (XARELTO) 20 MG TABS tablet 20 mg daily with supper.     rosuvastatin (CRESTOR) 20 MG tablet Take 20 mg by mouth daily.     Tiotropium  Bromide-Olodaterol (STIOLTO RESPIMAT) 2.5-2.5 MCG/ACT AERS INHALE 2 PUFFS BY MOUTH INTO THE LUNGS DAILY 4 g 0   Vitamin D, Ergocalciferol, (DRISDOL) 1.25 MG (50000 UNIT) CAPS capsule Take 50,000 Units by mouth  once a week.     No current facility-administered medications for this visit.     PHYSICAL EXAMINATION: ECOG PERFORMANCE STATUS: 0 - Asymptomatic  Vitals:   12/29/22 0927  BP: (!) 159/72  Pulse: 97  Resp: 18  Temp: 97.8 F (36.6 C)  SpO2: 100%   Filed Weights   12/29/22 0927  Weight: 233 lb 4.8 oz (105.8 kg)    GENERAL:alert, no distress and comfortable, walks with a cane he has Left breast surgical scar appears to be well-healed.  No concerns. No asymmetrical lower extremity swelling  LABORATORY DATA:  I have reviewed the data as listed Lab Results  Component Value Date   WBC 4.0 08/31/2022   HGB 12.4 08/31/2022   HCT 38.4 08/31/2022   MCV 93.2 08/31/2022   PLT 208 08/31/2022     Chemistry      Component Value Date/Time   NA 140 11/18/2022 1257   NA 144 06/03/2021 0939   K 4.6 11/18/2022 1257   CL 108 11/18/2022 1257   CO2 26 11/18/2022 1257   BUN 12 11/18/2022 1257   BUN 12 06/03/2021 0939   CREATININE 0.92 11/18/2022 1257   CREATININE 0.97 08/31/2022 1000      Component Value Date/Time   CALCIUM 8.9 11/18/2022 1257   ALKPHOS 41 08/31/2022 1000   AST 35 08/31/2022 1000   ALT 31 08/31/2022 1000   BILITOT 0.3 08/31/2022 1000       RADIOGRAPHIC STUDIES: I have personally reviewed the radiological images as listed and agreed with the findings in the report. No results found.  All questions were answered. The patient knows to call the clinic with any problems, questions or concerns. I spent 30 minutes in the care of this patient including H and P, review of records, counseling and coordination of care.     Rachel Moulds, MD 12/29/2022 9:29 AM

## 2022-12-29 NOTE — Assessment & Plan Note (Signed)
This is a very pleasant 67 year old postmenopausal female patient with newly diagnosed left breast DCIS, ER/PR positive originally seen prior to surgery who is here for follow-up.  Left breast lumpectomy showed DCIS, measuring 4 cm, high nuclear grade with necrosis, negative margins, ER/PR positive. She will now proceed with adjuvant radiation followed by antiestrogen therapy.  Given her very strong family history of blood clots, her sister also had a saddle PE, we have discussed about considering aromatase inhibitors frontline for antiestrogen therapy.  I have reviewed the mechanism of action, adverse effects with the antiestrogen therapy including but not limited to postmenopausal symptoms such as hot flashes, vaginal dryness, arthralgias, bone density loss.  She mentions that she may have history of osteoporosis.  Hence have ordered a repeat bone density scan today.  I have also encouraged her to start walking 30 minutes a day 5 days a week in addition to some calcium and vitamin D supplementation as tolerated. She had some questions about the adverse effects from radiation, we have briefly reviewed these as well.  She should return to clinic for follow-up in second or third week of October.

## 2023-01-04 ENCOUNTER — Other Ambulatory Visit: Payer: Self-pay | Admitting: Cardiology

## 2023-01-04 DIAGNOSIS — D0512 Intraductal carcinoma in situ of left breast: Secondary | ICD-10-CM | POA: Diagnosis not present

## 2023-01-10 ENCOUNTER — Ambulatory Visit: Payer: 59 | Admitting: Radiation Oncology

## 2023-01-11 ENCOUNTER — Ambulatory Visit: Payer: 59 | Admitting: Radiation Oncology

## 2023-01-12 ENCOUNTER — Other Ambulatory Visit: Payer: Self-pay

## 2023-01-12 ENCOUNTER — Ambulatory Visit
Admission: RE | Admit: 2023-01-12 | Discharge: 2023-01-12 | Disposition: A | Discharge status: Home / Self Care | Payer: 59 | Source: Ambulatory Visit | Attending: Radiation Oncology | Admitting: Radiation Oncology

## 2023-01-12 DIAGNOSIS — D0512 Intraductal carcinoma in situ of left breast: Secondary | ICD-10-CM | POA: Insufficient documentation

## 2023-01-12 LAB — RAD ONC ARIA SESSION SUMMARY
Course Elapsed Days: 0
Plan Fractions Treated to Date: 1
Plan Prescribed Dose Per Fraction: 2.66 Gy
Plan Total Fractions Prescribed: 16
Plan Total Prescribed Dose: 42.56 Gy
Reference Point Dosage Given to Date: 2.66 Gy
Reference Point Session Dosage Given: 2.66 Gy
Session Number: 1

## 2023-01-13 ENCOUNTER — Ambulatory Visit
Admission: RE | Admit: 2023-01-13 | Discharge: 2023-01-13 | Disposition: A | Discharge status: Home / Self Care | Payer: 59 | Source: Ambulatory Visit | Attending: Radiation Oncology | Admitting: Radiation Oncology

## 2023-01-13 ENCOUNTER — Other Ambulatory Visit: Payer: Self-pay

## 2023-01-13 DIAGNOSIS — D0512 Intraductal carcinoma in situ of left breast: Secondary | ICD-10-CM | POA: Diagnosis not present

## 2023-01-13 LAB — RAD ONC ARIA SESSION SUMMARY
Course Elapsed Days: 1
Plan Fractions Treated to Date: 2
Plan Prescribed Dose Per Fraction: 2.66 Gy
Plan Total Fractions Prescribed: 16
Plan Total Prescribed Dose: 42.56 Gy
Reference Point Dosage Given to Date: 5.32 Gy
Reference Point Session Dosage Given: 2.66 Gy
Session Number: 2

## 2023-01-13 MED ORDER — RADIAPLEXRX EX GEL: Freq: Once | CUTANEOUS | Status: AC

## 2023-01-13 MED ORDER — ALRA NON-METALLIC DEODORANT (RAD-ONC)
1.0000 | Freq: Once | TOPICAL | Status: AC
  Administered 2023-01-13: 1 via TOPICAL

## 2023-01-16 ENCOUNTER — Other Ambulatory Visit: Payer: Self-pay

## 2023-01-16 ENCOUNTER — Ambulatory Visit
Admission: RE | Admit: 2023-01-16 | Discharge: 2023-01-16 | Disposition: A | Discharge status: Home / Self Care | Payer: 59 | Source: Ambulatory Visit | Attending: Radiation Oncology | Admitting: Radiation Oncology

## 2023-01-16 DIAGNOSIS — D0512 Intraductal carcinoma in situ of left breast: Secondary | ICD-10-CM | POA: Diagnosis not present

## 2023-01-16 LAB — RAD ONC ARIA SESSION SUMMARY
Course Elapsed Days: 4
Plan Fractions Treated to Date: 3
Plan Prescribed Dose Per Fraction: 2.66 Gy
Plan Total Fractions Prescribed: 16
Plan Total Prescribed Dose: 42.56 Gy
Reference Point Dosage Given to Date: 7.98 Gy
Reference Point Session Dosage Given: 2.66 Gy
Session Number: 3

## 2023-01-17 ENCOUNTER — Other Ambulatory Visit: Payer: Self-pay

## 2023-01-17 ENCOUNTER — Ambulatory Visit
Admission: RE | Admit: 2023-01-17 | Discharge: 2023-01-17 | Disposition: A | Discharge status: Home / Self Care | Payer: 59 | Source: Ambulatory Visit | Attending: Radiation Oncology | Admitting: Radiation Oncology

## 2023-01-17 DIAGNOSIS — D0512 Intraductal carcinoma in situ of left breast: Secondary | ICD-10-CM | POA: Diagnosis not present

## 2023-01-17 LAB — RAD ONC ARIA SESSION SUMMARY
Course Elapsed Days: 5
Plan Fractions Treated to Date: 4
Plan Prescribed Dose Per Fraction: 2.66 Gy
Plan Total Fractions Prescribed: 16
Plan Total Prescribed Dose: 42.56 Gy
Reference Point Dosage Given to Date: 10.64 Gy
Reference Point Session Dosage Given: 2.66 Gy
Session Number: 4

## 2023-01-18 ENCOUNTER — Ambulatory Visit: Payer: 59

## 2023-01-18 ENCOUNTER — Ambulatory Visit
Admission: RE | Admit: 2023-01-18 | Discharge: 2023-01-18 | Disposition: A | Discharge status: Home / Self Care | Payer: 59 | Source: Ambulatory Visit | Attending: Radiation Oncology | Admitting: Radiation Oncology

## 2023-01-18 ENCOUNTER — Other Ambulatory Visit: Payer: Self-pay

## 2023-01-18 DIAGNOSIS — D0512 Intraductal carcinoma in situ of left breast: Secondary | ICD-10-CM | POA: Diagnosis not present

## 2023-01-18 LAB — RAD ONC ARIA SESSION SUMMARY
Course Elapsed Days: 6
Plan Fractions Treated to Date: 5
Plan Prescribed Dose Per Fraction: 2.66 Gy
Plan Total Fractions Prescribed: 16
Plan Total Prescribed Dose: 42.56 Gy
Reference Point Dosage Given to Date: 13.3 Gy
Reference Point Session Dosage Given: 2.66 Gy
Session Number: 5

## 2023-01-19 ENCOUNTER — Ambulatory Visit
Admission: RE | Admit: 2023-01-19 | Discharge: 2023-01-19 | Disposition: A | Discharge status: Home / Self Care | Payer: 59 | Source: Ambulatory Visit | Attending: Radiation Oncology | Admitting: Radiation Oncology

## 2023-01-19 ENCOUNTER — Other Ambulatory Visit: Payer: Self-pay

## 2023-01-19 DIAGNOSIS — D0512 Intraductal carcinoma in situ of left breast: Secondary | ICD-10-CM | POA: Diagnosis not present

## 2023-01-19 LAB — RAD ONC ARIA SESSION SUMMARY
Course Elapsed Days: 7
Plan Fractions Treated to Date: 6
Plan Prescribed Dose Per Fraction: 2.66 Gy
Plan Total Fractions Prescribed: 16
Plan Total Prescribed Dose: 42.56 Gy
Reference Point Dosage Given to Date: 15.96 Gy
Reference Point Session Dosage Given: 2.66 Gy
Session Number: 6

## 2023-01-20 ENCOUNTER — Other Ambulatory Visit: Payer: Self-pay

## 2023-01-20 ENCOUNTER — Ambulatory Visit
Admission: RE | Admit: 2023-01-20 | Discharge: 2023-01-20 | Disposition: A | Discharge status: Home / Self Care | Payer: 59 | Source: Ambulatory Visit | Attending: Radiation Oncology | Admitting: Radiation Oncology

## 2023-01-20 DIAGNOSIS — D0512 Intraductal carcinoma in situ of left breast: Secondary | ICD-10-CM | POA: Diagnosis not present

## 2023-01-20 LAB — RAD ONC ARIA SESSION SUMMARY
Course Elapsed Days: 8
Plan Fractions Treated to Date: 7
Plan Prescribed Dose Per Fraction: 2.66 Gy
Plan Total Fractions Prescribed: 16
Plan Total Prescribed Dose: 42.56 Gy
Reference Point Dosage Given to Date: 18.62 Gy
Reference Point Session Dosage Given: 2.66 Gy
Session Number: 7

## 2023-01-22 DIAGNOSIS — D0512 Intraductal carcinoma in situ of left breast: Secondary | ICD-10-CM | POA: Diagnosis not present

## 2023-01-23 ENCOUNTER — Ambulatory Visit
Admission: RE | Admit: 2023-01-23 | Discharge: 2023-01-23 | Disposition: A | Discharge status: Home / Self Care | Payer: 59 | Source: Ambulatory Visit | Attending: Radiation Oncology | Admitting: Radiation Oncology

## 2023-01-23 ENCOUNTER — Other Ambulatory Visit: Payer: Self-pay

## 2023-01-23 DIAGNOSIS — D0512 Intraductal carcinoma in situ of left breast: Secondary | ICD-10-CM | POA: Diagnosis not present

## 2023-01-23 LAB — RAD ONC ARIA SESSION SUMMARY
Course Elapsed Days: 11
Plan Fractions Treated to Date: 8
Plan Prescribed Dose Per Fraction: 2.66 Gy
Plan Total Fractions Prescribed: 16
Plan Total Prescribed Dose: 42.56 Gy
Reference Point Dosage Given to Date: 21.28 Gy
Reference Point Session Dosage Given: 2.66 Gy
Session Number: 8

## 2023-01-24 ENCOUNTER — Ambulatory Visit: Payer: 59

## 2023-01-25 ENCOUNTER — Other Ambulatory Visit: Payer: Self-pay

## 2023-01-25 ENCOUNTER — Ambulatory Visit
Admission: RE | Admit: 2023-01-25 | Discharge: 2023-01-25 | Disposition: A | Discharge status: Home / Self Care | Payer: 59 | Source: Ambulatory Visit | Attending: Radiation Oncology | Admitting: Radiation Oncology

## 2023-01-25 DIAGNOSIS — D0512 Intraductal carcinoma in situ of left breast: Secondary | ICD-10-CM | POA: Diagnosis not present

## 2023-01-25 LAB — RAD ONC ARIA SESSION SUMMARY
Course Elapsed Days: 13
Plan Fractions Treated to Date: 9
Plan Prescribed Dose Per Fraction: 2.66 Gy
Plan Total Fractions Prescribed: 16
Plan Total Prescribed Dose: 42.56 Gy
Reference Point Dosage Given to Date: 23.94 Gy
Reference Point Session Dosage Given: 2.66 Gy
Session Number: 9

## 2023-01-26 ENCOUNTER — Other Ambulatory Visit: Payer: Self-pay

## 2023-01-26 ENCOUNTER — Ambulatory Visit
Admission: RE | Admit: 2023-01-26 | Discharge: 2023-01-26 | Disposition: A | Discharge status: Home / Self Care | Payer: 59 | Source: Ambulatory Visit | Attending: Radiation Oncology | Admitting: Radiation Oncology

## 2023-01-26 DIAGNOSIS — D0512 Intraductal carcinoma in situ of left breast: Secondary | ICD-10-CM | POA: Diagnosis not present

## 2023-01-26 LAB — RAD ONC ARIA SESSION SUMMARY
Course Elapsed Days: 14
Plan Fractions Treated to Date: 10
Plan Prescribed Dose Per Fraction: 2.66 Gy
Plan Total Fractions Prescribed: 16
Plan Total Prescribed Dose: 42.56 Gy
Reference Point Dosage Given to Date: 26.6 Gy
Reference Point Session Dosage Given: 2.66 Gy
Session Number: 10

## 2023-01-27 ENCOUNTER — Other Ambulatory Visit: Payer: Self-pay

## 2023-01-27 ENCOUNTER — Ambulatory Visit
Admission: RE | Admit: 2023-01-27 | Discharge: 2023-01-27 | Disposition: A | Discharge status: Home / Self Care | Payer: 59 | Source: Ambulatory Visit | Attending: Radiation Oncology | Admitting: Radiation Oncology

## 2023-01-27 ENCOUNTER — Ambulatory Visit: Payer: 59 | Admitting: Radiation Oncology

## 2023-01-27 DIAGNOSIS — D0512 Intraductal carcinoma in situ of left breast: Secondary | ICD-10-CM | POA: Diagnosis not present

## 2023-01-27 LAB — RAD ONC ARIA SESSION SUMMARY
Course Elapsed Days: 15
Plan Fractions Treated to Date: 11
Plan Prescribed Dose Per Fraction: 2.66 Gy
Plan Total Fractions Prescribed: 16
Plan Total Prescribed Dose: 42.56 Gy
Reference Point Dosage Given to Date: 29.26 Gy
Reference Point Session Dosage Given: 2.66 Gy
Session Number: 11

## 2023-01-30 ENCOUNTER — Other Ambulatory Visit: Payer: Self-pay

## 2023-01-30 ENCOUNTER — Ambulatory Visit
Admission: RE | Admit: 2023-01-30 | Discharge: 2023-01-30 | Disposition: A | Discharge status: Home / Self Care | Payer: 59 | Source: Ambulatory Visit | Attending: Radiation Oncology | Admitting: Radiation Oncology

## 2023-01-30 DIAGNOSIS — D0512 Intraductal carcinoma in situ of left breast: Secondary | ICD-10-CM | POA: Diagnosis not present

## 2023-01-30 LAB — RAD ONC ARIA SESSION SUMMARY
Course Elapsed Days: 18
Plan Fractions Treated to Date: 12
Plan Prescribed Dose Per Fraction: 2.66 Gy
Plan Total Fractions Prescribed: 16
Plan Total Prescribed Dose: 42.56 Gy
Reference Point Dosage Given to Date: 31.92 Gy
Reference Point Session Dosage Given: 2.66 Gy
Session Number: 12

## 2023-01-31 ENCOUNTER — Ambulatory Visit: Payer: 59

## 2023-02-01 ENCOUNTER — Ambulatory Visit: Payer: 59

## 2023-02-01 ENCOUNTER — Ambulatory Visit
Admission: RE | Admit: 2023-02-01 | Discharge: 2023-02-01 | Disposition: A | Discharge status: Home / Self Care | Payer: 59 | Source: Ambulatory Visit | Attending: Radiation Oncology | Admitting: Radiation Oncology

## 2023-02-01 ENCOUNTER — Other Ambulatory Visit: Payer: Self-pay

## 2023-02-01 DIAGNOSIS — D0512 Intraductal carcinoma in situ of left breast: Secondary | ICD-10-CM | POA: Diagnosis not present

## 2023-02-01 LAB — RAD ONC ARIA SESSION SUMMARY
Course Elapsed Days: 20
Plan Fractions Treated to Date: 13
Plan Prescribed Dose Per Fraction: 2.66 Gy
Plan Total Fractions Prescribed: 16
Plan Total Prescribed Dose: 42.56 Gy
Reference Point Dosage Given to Date: 34.58 Gy
Reference Point Session Dosage Given: 2.66 Gy
Session Number: 13

## 2023-02-02 ENCOUNTER — Ambulatory Visit: Payer: 59

## 2023-02-02 ENCOUNTER — Other Ambulatory Visit: Payer: Self-pay

## 2023-02-02 ENCOUNTER — Ambulatory Visit
Admission: RE | Admit: 2023-02-02 | Discharge: 2023-02-02 | Disposition: A | Discharge status: Home / Self Care | Payer: 59 | Source: Ambulatory Visit | Attending: Radiation Oncology | Admitting: Radiation Oncology

## 2023-02-02 DIAGNOSIS — D0512 Intraductal carcinoma in situ of left breast: Secondary | ICD-10-CM | POA: Diagnosis not present

## 2023-02-02 LAB — RAD ONC ARIA SESSION SUMMARY
Course Elapsed Days: 21
Plan Fractions Treated to Date: 14
Plan Prescribed Dose Per Fraction: 2.66 Gy
Plan Total Fractions Prescribed: 16
Plan Total Prescribed Dose: 42.56 Gy
Reference Point Dosage Given to Date: 37.24 Gy
Reference Point Session Dosage Given: 2.66 Gy
Session Number: 14

## 2023-02-03 ENCOUNTER — Ambulatory Visit
Admission: RE | Admit: 2023-02-03 | Discharge: 2023-02-03 | Disposition: A | Discharge status: Home / Self Care | Payer: 59 | Source: Ambulatory Visit | Attending: Radiation Oncology | Admitting: Radiation Oncology

## 2023-02-03 ENCOUNTER — Other Ambulatory Visit: Payer: Self-pay

## 2023-02-03 ENCOUNTER — Ambulatory Visit: Payer: 59

## 2023-02-03 DIAGNOSIS — D0512 Intraductal carcinoma in situ of left breast: Secondary | ICD-10-CM | POA: Diagnosis not present

## 2023-02-03 LAB — RAD ONC ARIA SESSION SUMMARY
Course Elapsed Days: 22
Plan Fractions Treated to Date: 15
Plan Prescribed Dose Per Fraction: 2.66 Gy
Plan Total Fractions Prescribed: 16
Plan Total Prescribed Dose: 42.56 Gy
Reference Point Dosage Given to Date: 39.9 Gy
Reference Point Session Dosage Given: 2.66 Gy
Session Number: 15

## 2023-02-03 MED ORDER — RADIAPLEXRX EX GEL
Freq: Once | CUTANEOUS | Status: AC
  Administered 2023-02-03: 1 via TOPICAL

## 2023-02-06 ENCOUNTER — Encounter: Payer: Self-pay | Admitting: *Deleted

## 2023-02-06 ENCOUNTER — Ambulatory Visit: Payer: 59

## 2023-02-06 ENCOUNTER — Ambulatory Visit
Admission: RE | Admit: 2023-02-06 | Discharge: 2023-02-06 | Disposition: A | Discharge status: Home / Self Care | Payer: 59 | Source: Ambulatory Visit | Attending: Radiation Oncology | Admitting: Radiation Oncology

## 2023-02-06 ENCOUNTER — Other Ambulatory Visit: Payer: Self-pay

## 2023-02-06 DIAGNOSIS — D0512 Intraductal carcinoma in situ of left breast: Secondary | ICD-10-CM | POA: Diagnosis not present

## 2023-02-06 LAB — RAD ONC ARIA SESSION SUMMARY
Course Elapsed Days: 25
Plan Fractions Treated to Date: 16
Plan Prescribed Dose Per Fraction: 2.66 Gy
Plan Total Fractions Prescribed: 16
Plan Total Prescribed Dose: 42.56 Gy
Reference Point Dosage Given to Date: 42.56 Gy
Reference Point Session Dosage Given: 2.66 Gy
Session Number: 16

## 2023-02-07 ENCOUNTER — Ambulatory Visit: Payer: 59

## 2023-02-07 ENCOUNTER — Ambulatory Visit
Admission: RE | Admit: 2023-02-07 | Discharge: 2023-02-07 | Disposition: A | Discharge status: Home / Self Care | Payer: 59 | Source: Ambulatory Visit | Attending: Radiation Oncology | Admitting: Radiation Oncology

## 2023-02-07 ENCOUNTER — Other Ambulatory Visit: Payer: Self-pay

## 2023-02-07 DIAGNOSIS — D0512 Intraductal carcinoma in situ of left breast: Secondary | ICD-10-CM | POA: Diagnosis present

## 2023-02-07 LAB — RAD ONC ARIA SESSION SUMMARY
Course Elapsed Days: 26
Plan Fractions Treated to Date: 1
Plan Prescribed Dose Per Fraction: 2 Gy
Plan Total Fractions Prescribed: 4
Plan Total Prescribed Dose: 8 Gy
Reference Point Dosage Given to Date: 2 Gy
Reference Point Session Dosage Given: 2 Gy
Session Number: 17

## 2023-02-08 ENCOUNTER — Other Ambulatory Visit: Payer: Self-pay

## 2023-02-08 ENCOUNTER — Ambulatory Visit: Payer: 59

## 2023-02-08 ENCOUNTER — Ambulatory Visit
Admission: RE | Admit: 2023-02-08 | Discharge: 2023-02-08 | Disposition: A | Discharge status: Home / Self Care | Payer: 59 | Source: Ambulatory Visit | Attending: Radiation Oncology | Admitting: Radiation Oncology

## 2023-02-08 DIAGNOSIS — D0512 Intraductal carcinoma in situ of left breast: Secondary | ICD-10-CM | POA: Diagnosis not present

## 2023-02-08 LAB — RAD ONC ARIA SESSION SUMMARY
Course Elapsed Days: 27
Plan Fractions Treated to Date: 2
Plan Prescribed Dose Per Fraction: 2 Gy
Plan Total Fractions Prescribed: 4
Plan Total Prescribed Dose: 8 Gy
Reference Point Dosage Given to Date: 4 Gy
Reference Point Session Dosage Given: 2 Gy
Session Number: 18

## 2023-02-09 ENCOUNTER — Other Ambulatory Visit: Payer: Self-pay

## 2023-02-09 ENCOUNTER — Ambulatory Visit: Payer: 59

## 2023-02-09 ENCOUNTER — Ambulatory Visit
Admission: RE | Admit: 2023-02-09 | Discharge: 2023-02-09 | Disposition: A | Discharge status: Home / Self Care | Payer: 59 | Source: Ambulatory Visit | Attending: Radiation Oncology | Admitting: Radiation Oncology

## 2023-02-09 ENCOUNTER — Other Ambulatory Visit: Payer: Self-pay | Admitting: Radiation Oncology

## 2023-02-09 DIAGNOSIS — D0512 Intraductal carcinoma in situ of left breast: Secondary | ICD-10-CM | POA: Diagnosis not present

## 2023-02-09 LAB — RAD ONC ARIA SESSION SUMMARY
Course Elapsed Days: 28
Plan Fractions Treated to Date: 3
Plan Prescribed Dose Per Fraction: 2 Gy
Plan Total Fractions Prescribed: 4
Plan Total Prescribed Dose: 8 Gy
Reference Point Dosage Given to Date: 6 Gy
Reference Point Session Dosage Given: 2 Gy
Session Number: 19

## 2023-02-10 ENCOUNTER — Ambulatory Visit
Admission: RE | Admit: 2023-02-10 | Discharge: 2023-02-10 | Disposition: A | Discharge status: Home / Self Care | Payer: 59 | Source: Ambulatory Visit | Attending: Radiation Oncology | Admitting: Radiation Oncology

## 2023-02-10 ENCOUNTER — Other Ambulatory Visit: Payer: Self-pay

## 2023-02-10 DIAGNOSIS — D0512 Intraductal carcinoma in situ of left breast: Secondary | ICD-10-CM | POA: Diagnosis not present

## 2023-02-10 LAB — RAD ONC ARIA SESSION SUMMARY
Course Elapsed Days: 29
Plan Fractions Treated to Date: 4
Plan Prescribed Dose Per Fraction: 2 Gy
Plan Total Fractions Prescribed: 4
Plan Total Prescribed Dose: 8 Gy
Reference Point Dosage Given to Date: 8 Gy
Reference Point Session Dosage Given: 2 Gy
Session Number: 20

## 2023-02-14 NOTE — Radiation Completion Notes (Addendum)
Radiation Oncology         [phone] ________________________________  Name: [name] MRN: [id number]  Date of Service: 02/10/2023  DOB: Dec 21, 1955  End of Treatment Note    Diagnosis:  Intermediate to High grade, ER/PR positive DCIS of the left breast   Intent: Curative     ==========DELIVERED PLANS==========  First Treatment Date: 2023-01-12 - Last Treatment Date: 2023-02-10   Plan Name: Breast_L_BH Site: Breast, Left Technique: 3D Mode: Photon Dose Per Fraction: 2.66 Gy Prescribed Dose (Delivered / Prescribed): 42.56 Gy / 42.56 Gy Prescribed Fxs (Delivered / Prescribed): 16 / 16   Plan Name: Brst_L_Bst_BH Site: Breast, Left Technique: 3D Mode: Photon Dose Per Fraction: 2 Gy Prescribed Dose (Delivered / Prescribed): 8 Gy / 8 Gy Prescribed Fxs (Delivered / Prescribed): 4 / 4     ==========ON TREATMENT VISIT DATES========== 2023-01-13, 2023-01-20, 2023-01-27, 2023-02-03, 2023-02-10     See weekly On Treatment Notes in Epic for details. The patient tolerated radiation. She developed fatigue and anticipated skin changes in the treatment field.   The patient will receive a call in about one month from the radiation oncology department. She will continue follow up with Dr. Al Pimple as well.      Osker Mason, PAC

## 2023-02-27 ENCOUNTER — Inpatient Hospital Stay: Payer: 59 | Attending: Hematology and Oncology | Admitting: Hematology and Oncology

## 2023-02-27 VITALS — BP 147/55 | HR 92 | Temp 98.0°F | Resp 18 | Wt 231.4 lb

## 2023-02-27 DIAGNOSIS — Z833 Family history of diabetes mellitus: Secondary | ICD-10-CM | POA: Diagnosis not present

## 2023-02-27 DIAGNOSIS — Z79899 Other long term (current) drug therapy: Secondary | ICD-10-CM | POA: Diagnosis not present

## 2023-02-27 DIAGNOSIS — Z885 Allergy status to narcotic agent status: Secondary | ICD-10-CM | POA: Diagnosis not present

## 2023-02-27 DIAGNOSIS — J4489 Other specified chronic obstructive pulmonary disease: Secondary | ICD-10-CM | POA: Insufficient documentation

## 2023-02-27 DIAGNOSIS — Z8042 Family history of malignant neoplasm of prostate: Secondary | ICD-10-CM | POA: Diagnosis not present

## 2023-02-27 DIAGNOSIS — Z79811 Long term (current) use of aromatase inhibitors: Secondary | ICD-10-CM | POA: Insufficient documentation

## 2023-02-27 DIAGNOSIS — Z923 Personal history of irradiation: Secondary | ICD-10-CM | POA: Insufficient documentation

## 2023-02-27 DIAGNOSIS — Z90710 Acquired absence of both cervix and uterus: Secondary | ICD-10-CM | POA: Insufficient documentation

## 2023-02-27 DIAGNOSIS — Z825 Family history of asthma and other chronic lower respiratory diseases: Secondary | ICD-10-CM | POA: Diagnosis not present

## 2023-02-27 DIAGNOSIS — Z170 Estrogen receptor positive status [ER+]: Secondary | ICD-10-CM | POA: Diagnosis not present

## 2023-02-27 DIAGNOSIS — E119 Type 2 diabetes mellitus without complications: Secondary | ICD-10-CM | POA: Diagnosis not present

## 2023-02-27 DIAGNOSIS — Z7901 Long term (current) use of anticoagulants: Secondary | ICD-10-CM | POA: Diagnosis not present

## 2023-02-27 DIAGNOSIS — Z86718 Personal history of other venous thrombosis and embolism: Secondary | ICD-10-CM | POA: Insufficient documentation

## 2023-02-27 DIAGNOSIS — Z8041 Family history of malignant neoplasm of ovary: Secondary | ICD-10-CM | POA: Diagnosis not present

## 2023-02-27 DIAGNOSIS — Z8249 Family history of ischemic heart disease and other diseases of the circulatory system: Secondary | ICD-10-CM | POA: Diagnosis not present

## 2023-02-27 DIAGNOSIS — D0512 Intraductal carcinoma in situ of left breast: Secondary | ICD-10-CM | POA: Diagnosis not present

## 2023-02-27 DIAGNOSIS — Z8051 Family history of malignant neoplasm of kidney: Secondary | ICD-10-CM | POA: Diagnosis not present

## 2023-02-27 DIAGNOSIS — Z1721 Progesterone receptor positive status: Secondary | ICD-10-CM | POA: Diagnosis not present

## 2023-02-27 DIAGNOSIS — M549 Dorsalgia, unspecified: Secondary | ICD-10-CM | POA: Insufficient documentation

## 2023-02-27 DIAGNOSIS — F17210 Nicotine dependence, cigarettes, uncomplicated: Secondary | ICD-10-CM | POA: Insufficient documentation

## 2023-02-27 DIAGNOSIS — Z86711 Personal history of pulmonary embolism: Secondary | ICD-10-CM | POA: Diagnosis not present

## 2023-02-27 DIAGNOSIS — K2270 Barrett's esophagus without dysplasia: Secondary | ICD-10-CM | POA: Insufficient documentation

## 2023-02-27 DIAGNOSIS — I2510 Atherosclerotic heart disease of native coronary artery without angina pectoris: Secondary | ICD-10-CM | POA: Diagnosis not present

## 2023-02-27 MED ORDER — ANASTROZOLE 1 MG PO TABS: 1.0000 mg | ORAL_TABLET | Freq: Every day | ORAL | 3 refills | Status: DC

## 2023-02-27 NOTE — Assessment & Plan Note (Signed)
This is a very pleasant 67 year old postmenopausal female patient with newly diagnosed left breast DCIS, ER/PR positive originally seen prior to surgery who is here for follow-up.  Left breast lumpectomy showed DCIS, measuring 4 cm, high nuclear grade with necrosis, negative margins, ER/PR positive.  Breast Cancer Post-Radiation Skin discoloration and discomfort. Temporary use of triamcinolone provided relief, but cautioned against long-term use due to skin thinning potential. -Continue monitoring skin changes and healing. -Discontinue triamcinolone use to avoid skin thinning.  Hormone Therapy for Breast Cancer Discussed starting anastrozole for anti estrogen therapy. Discussed adverse effects including hot flashes, vaginal dryness, arthralgias and bone density loss. -Advise patient to take medication for five years and reassured that potential side effects should improve after 3-4 months of consistent use.  Bone Density Discussed potential impact of hormone therapy on bone density. Patient has a bone density test scheduled for August 14, 2023. -Advise patient to take a combination of calcium and vitamin D supplements. -Encourage weight-bearing activities and adequate sun exposure for natural vitamin D production.  Barrett's Esophagus Currently under observation. -Continue monitoring with gastroenterologist.  General Health Maintenance -Continue walking 30 minutes a day for overall health and weight management. -Yearly mammogram (diagnostic type) to monitor for any changes in breast tissue. -Follow-up in 3-4 months to assess response to new medication.

## 2023-02-27 NOTE — Progress Notes (Signed)
[facility] CONSULT NOTE  Patient Care Team: Jackie Plum, MD as PCP - General (Internal Medicine) Dulce Sellar Iline Oven, MD as Consulting Physician (Cardiology) Donnelly Angelica, RN as Oncology Nurse Navigator Pershing Proud, RN as Oncology Nurse Navigator Harriette Bouillon, MD as Consulting Physician (General Surgery) Dorothy Puffer, MD as Consulting Physician (Radiation Oncology) Rachel Moulds, MD as Consulting Physician (Hematology and Oncology)  CHIEF COMPLAINTS/PURPOSE OF CONSULTATION:  DCIS  ASSESSMENT & PLAN:  Ductal carcinoma in situ (DCIS) of left breast This is a very pleasant 67 year old postmenopausal female patient with newly diagnosed left breast DCIS, ER/PR positive originally seen prior to surgery who is here for follow-up.  Left breast lumpectomy showed DCIS, measuring 4 cm, high nuclear grade with necrosis, negative margins, ER/PR positive.  Breast Cancer Post-Radiation Skin discoloration and discomfort. Temporary use of triamcinolone provided relief, but cautioned against long-term use due to skin thinning potential. -Continue monitoring skin changes and healing. -Discontinue triamcinolone use to avoid skin thinning.  Hormone Therapy for Breast Cancer Discussed starting anastrozole for anti estrogen therapy. Discussed adverse effects including hot flashes, vaginal dryness, arthralgias and bone density loss. -Advise patient to take medication for five years and reassured that potential side effects should improve after 3-4 months of consistent use.  Bone Density Discussed potential impact of hormone therapy on bone density. Patient has a bone density test scheduled for August 14, 2023. -Advise patient to take a combination of calcium and vitamin D supplements. -Encourage weight-bearing activities and adequate sun exposure for natural vitamin D production.  Barrett's Esophagus Currently under observation. -Continue monitoring with  gastroenterologist.  General Health Maintenance -Continue walking 30 minutes a day for overall health and weight management. -Yearly mammogram (diagnostic type) to monitor for any changes in breast tissue. -Follow-up in 3-4 months to assess response to new medication.   No orders of the defined types were placed in this encounter.    HISTORY OF PRESENTING ILLNESS:  [name] 67 y.o. female is here because of DCIS  Oncology History  Ductal carcinoma in situ (DCIS) of left breast  07/04/2022 Mammogram   Screening mammogram suggested further evaluation for calcifications in the left breast.  She then had a diagnostic mammogram which showed 4.8 cm span of indeterminate calcifications in the left breast warranting tissue diagnosis.   08/23/2022 Pathology Results   Left breast needle core biopsy showed grade 3 DCIS, necrosis and calcifications present, prognostic showed ER 100% positive strong staining PR 60% positive moderate to strong staining   08/29/2022 Initial Diagnosis   Ductal carcinoma in situ (DCIS) of left breast   09/06/2022 Genetic Testing   Negative genetic testing on the Multi-Cancer+RNA gene panel.  CHEK2 c.164C>T (p.Ser55Phe), CHEK2 c.542G>A (p.Arg181His), MSH3 c.2600T>C (p.Ile867Thr), and RET c.2611G>A (p.Val871Ile) VUS were identified.  The report date is September 06, 2022.  The Multi-Cancer + RNA Panel offered by [facility] includes sequencing and/or deletion/duplication analysis of the following 70 genes:  AIP*, ALK, APC*, ATM*, AXIN2*, BAP1*, BARD1*, BLM*, BMPR1A*, BRCA1*, BRCA2*, BRIP1*, CDC73*, CDH1*, CDK4, CDKN1B*, CDKN2A, CHEK2*, CTNNA1*, DICER1*, EPCAM (del/dup only), EGFR, FH*, FLCN*, GREM1 (promoter dup only), HOXB13, KIT, LZTR1, MAX*, MBD4, MEN1*, MET, MITF, MLH1*, MSH2*, MSH3*, MSH6*, MUTYH*, NF1*, NF2*, NTHL1*, PALB2*, PDGFRA, PMS2*, POLD1*, POLE*, POT1*, PRKAR1A*, PTCH1*, PTEN*, RAD51C*, RAD51D*, RB1*, RET, SDHA* (sequencing only), SDHAF2*, SDHB*, SDHC*, SDHD*,  SMAD4*, SMARCA4*, SMARCB1*, SMARCE1*, STK11*, SUFU*, TMEM127*, TP53*, TSC1*, TSC2*, VHL*. RNA analysis is performed for * genes.     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  No history of breast cancer in the family however several other cancers run in the family.  Maternal aunt had ovarian cancer, sister had renal cancer, dad had metastatic carcinoma of unknown primary.  She has 2 children, 2 boys.  No history of hormone replacement therapy.  She complains of occasional back pain for which she takes prednisone, otherwise significant history of DVT for which she stayed on Coumadin and PE subsequently hence she was recommended indefinite anticoagulation.  There is also strong family history of blood clots.  Sister had saddle pulmonary embolism.    She had left breast lumpectomy which showed DCIS, cribriform and solid intermediate to high nuclear grade with necrosis, DCIS measured 4 cm, negative margins, ER/PR positive.  ER 100% positive strong staining PR 40% positive moderate to strong staining.    Interval history  Discussed the use of AI scribe software for clinical note transcription with the patient, who gave verbal consent to proceed.  History of Present Illness    The patient, with a history of breast cancer and saddle pulmonary embolism, presents for a follow-up visit. She has completed radiation therapy and continues on Xarelto. She is also dealing with the physical changes to her breast post-radiation and is considering reconstruction options. However, due to her age, history of blood clots, and diabetes, she is hesitant about undergoing additional surgery. The patient also has Barrett's esophagus, which is currently being monitored. She is taking medication for her diabetes and has noticed some weight loss. The patient has started walking 30 minutes a day for  exercise.  Rest of the pertinent 10 point ROS reviewed and negative  MEDICAL HISTORY:  Past Medical History:  Diagnosis Date   Asthma    Breast cancer (HCC) 08/23/2022   Bronchitis, asthmatic    COPD (chronic obstructive pulmonary disease) (HCC)    Coronary artery calcification seen on CT scan    Diabetes (HCC)    DVT (deep venous thrombosis) (HCC)    Family history of kidney cancer    Family history of ovarian cancer    Family history of prostate cancer    Hyperlipidemia    Hypertension    Observed sleep apnea     SURGICAL HISTORY: Past Surgical History:  Procedure Laterality Date   ABDOMINAL HYSTERECTOMY     BREAST BIOPSY Left 08/23/2022   MM LT BREAST BX W LOC DEV 1ST LESION IMAGE BX SPEC STEREO GUIDE 08/23/2022 [facility]   BREAST BIOPSY Left 08/23/2022   MM LT BREAST BX W LOC DEV EA AD LESION IMG BX SPEC STEREO GUIDE 08/23/2022 [facility]   BREAST BIOPSY  11/22/2022   MM LT RADIOACTIVE SEED EA ADD LESION LOC MAMMO GUIDE 11/22/2022 [facility]   BREAST BIOPSY  11/22/2022   MM LT RADIOACTIVE SEED LOC MAMMO GUIDE 11/22/2022 [facility]   BREAST LUMPECTOMY WITH RADIOACTIVE SEED LOCALIZATION Left 11/23/2022   Procedure: LEFT BREAST SEED BRACKETED CENTRAL LUMPECTOMY AND NIPPLE RESECTION;  Surgeon: Harriette Bouillon, MD;  Location: [facility];  Service: General;  Laterality: Left;   KNEE ARTHROSCOPY Bilateral    TOTAL KNEE ARTHROPLASTY Bilateral     SOCIAL HISTORY: Social History   Socioeconomic History   Marital status: Single    Spouse name: Not on file   Number of children: Not on file   Years of education: Not on file  Highest education level: Not on file  Occupational History   Not on file  Tobacco Use   Smoking status: Every Day    Current packs/day: 1.50    Average packs/day: 1.5 packs/day for 58.7 years (88.1 ttl pk-yrs)    Types: Cigarettes    Start date: 06/07/1964    Passive exposure: Current   Smokeless tobacco:  Never  Vaping Use   Vaping status: Never Used  Substance and Sexual Activity   Alcohol use: Not Currently   Drug use: Not Currently    Types: Marijuana, "Crack" cocaine    Comment: None since 1999   Sexual activity: Not on file  Other Topics Concern   Not on file  Social History Narrative   Not on file   Social Determinants of Health   Financial Resource Strain: Not on file  Food Insecurity: Food Insecurity Present (09/13/2022)   Hunger Vital Sign    Worried About Running Out of Food in the Last Year: Never true    Ran Out of Food in the Last Year: Sometimes true  Transportation Needs: No Transportation Needs (09/13/2022)   PRAPARE - Administrator, Civil Service (Medical): No    Lack of Transportation (Non-Medical): No  Physical Activity: Not on file  Stress: Not on file  Social Connections: Not on file  Intimate Partner Violence: Not At Risk (09/13/2022)   Humiliation, Afraid, Rape, and Kick questionnaire    Fear of Current or Ex-Partner: No    Emotionally Abused: No    Physically Abused: No    Sexually Abused: No    FAMILY HISTORY: Family History  Problem Relation Age of Onset   Pulmonary embolism Mother    Heart Problems Mother    Hypertension Mother    Heart attack Father    Diabetes Father    Pulmonary embolism Sister    Healthy Sister    Pulmonary embolism Brother    Prostate cancer Brother 52   Kidney cancer Brother 9   Pulmonary embolism Brother    Ovarian cancer Maternal Aunt     ALLERGIES:  is allergic to codeine, codeine phosphate, and tramadol-acetaminophen.  MEDICATIONS:  Current Outpatient Medications  Medication Sig Dispense Refill   anastrozole (ARIMIDEX) 1 MG tablet Take 1 tablet (1 mg total) by mouth daily. 90 tablet 3   amLODipine (NORVASC) 10 MG tablet amlodipine 10 mg tablet     celecoxib (CELEBREX) 400 MG capsule Take 400 mg by mouth 2 (two) times daily.     cyclobenzaprine (FLEXERIL) 10 MG tablet Take 5-10 mg by mouth 3  (three) times daily as needed for muscle spasms.     diclofenac Sodium (VOLTAREN) 1 % GEL Apply 1 application topically 4 (four) times daily.     DULoxetine (CYMBALTA) 60 MG capsule Take 120 mg by mouth daily.     esomeprazole (NEXIUM) 40 MG capsule esomeprazole magnesium 40 mg capsule,delayed release  TAKE 1 CAPSULE BY MOUTH EVERY DAY     furosemide (LASIX) 40 MG tablet Take 40 mg by mouth daily.     hydrALAZINE (APRESOLINE) 25 MG tablet Take 25 mg by mouth daily.     HYDROcodone-acetaminophen (NORCO/VICODIN) 5-325 MG tablet Take 1 tablet by mouth 2 (two) times daily as needed.     isosorbide mononitrate (IMDUR) 60 MG 24 hr tablet Take 1 tablet (60 mg total) by mouth daily. Patient needs an appointment for further refills. 2 nd attempt 15 tablet 0   lidocaine (LIDODERM) 5 % 1 patch every  12 (twelve) hours.     losartan (COZAAR) 100 MG tablet Take 100 mg by mouth daily.     metoprolol succinate (TOPROL-XL) 25 MG 24 hr tablet Take 1 tablet (25 mg total) by mouth daily. 30 tablet 1   MOUNJARO 5 MG/0.5ML Pen Inject 5 mg into the skin once a week.     Multiple Vitamins-Minerals (CENTRUM ADULTS PO) Take by mouth daily.     nitroGLYCERIN (NITROSTAT) 0.4 MG SL tablet Place 1 tablet (0.4 mg total) under the tongue every 5 (five) minutes as needed. 30 tablet 3   nortriptyline (PAMELOR) 50 MG capsule Take by mouth at bedtime as needed.     ondansetron (ZOFRAN) 4 MG tablet Take 4 mg by mouth every 8 (eight) hours as needed for nausea.     oxyCODONE (OXY IR/ROXICODONE) 5 MG immediate release tablet Take 1 tablet (5 mg total) by mouth every 6 (six) hours as needed for severe pain. 15 tablet 0   potassium chloride (KLOR-CON M) 10 MEQ tablet 10 mEq daily as needed (When taking lasix for edema).     rivaroxaban (XARELTO) 20 MG TABS tablet 20 mg daily with supper.     rosuvastatin (CRESTOR) 20 MG tablet Take 20 mg by mouth daily.     Tiotropium Bromide-Olodaterol (STIOLTO RESPIMAT) 2.5-2.5 MCG/ACT AERS INHALE 2  PUFFS BY MOUTH INTO THE LUNGS DAILY 4 g 0   Vitamin D, Ergocalciferol, (DRISDOL) 1.25 MG (50000 UNIT) CAPS capsule Take 50,000 Units by mouth once a week.     No current facility-administered medications for this visit.     PHYSICAL EXAMINATION: ECOG PERFORMANCE STATUS: 0 - Asymptomatic  Vitals:   02/27/23 1157  BP: (!) 147/55  Pulse: 92  Resp: 18  Temp: 98 F (36.7 C)  SpO2: 95%   Filed Weights   02/27/23 1157  Weight: 231 lb 6.4 oz (105 kg)    GENERAL:alert, no distress and comfortable, walks with a cane he has Left breast healing from ongoing radiation.  LABORATORY DATA:  I have reviewed the data as listed Lab Results  Component Value Date   WBC 4.0 08/31/2022   HGB 12.4 08/31/2022   HCT 38.4 08/31/2022   MCV 93.2 08/31/2022   PLT 208 08/31/2022     Chemistry      Component Value Date/Time   NA 140 11/18/2022 1257   NA 144 06/03/2021 0939   K 4.6 11/18/2022 1257   CL 108 11/18/2022 1257   CO2 26 11/18/2022 1257   BUN 12 11/18/2022 1257   BUN 12 06/03/2021 0939   CREATININE 0.92 11/18/2022 1257   CREATININE 0.97 08/31/2022 1000      Component Value Date/Time   CALCIUM 8.9 11/18/2022 1257   ALKPHOS 41 08/31/2022 1000   AST 35 08/31/2022 1000   ALT 31 08/31/2022 1000   BILITOT 0.3 08/31/2022 1000       RADIOGRAPHIC STUDIES: I have personally reviewed the radiological images as listed and agreed with the findings in the report. No results found.  All questions were answered. The patient knows to call the clinic with any problems, questions or concerns. I spent 30 minutes in the care of this patient including H and P, review of records, counseling and coordination of care.     Rachel Moulds, MD 02/27/2023 12:19 PM

## 2023-04-13 ENCOUNTER — Telehealth: Payer: Self-pay | Admitting: *Deleted

## 2023-04-13 NOTE — Telephone Encounter (Signed)
This RN spoke with pt per call stating since starting the anastrozole she has had increasing joint pain and stiffness now interfering with her ADL's.  " I just cannot take this discomfort ".  Per discussion - plan is for pt to hold the medication for 2 weeks and then call this RN with update on status for further recommendations.  Of note pt started the anastrozole 6 weeks ago for DCIS.

## 2023-05-01 ENCOUNTER — Ambulatory Visit
Admission: RE | Admit: 2023-05-01 | Discharge: 2023-05-01 | Disposition: A | Discharge status: Home / Self Care | Payer: 59 | Source: Ambulatory Visit | Attending: Radiation Oncology | Admitting: Radiation Oncology

## 2023-05-01 NOTE — Progress Notes (Signed)
  Radiation Oncology         [phone] ________________________________  Name: [name] MRN: [id number]  Date of Service: 05/01/2023  DOB: 31-Dec-1955  Post Treatment Telephone Note  Diagnosis:  Intermediate to High grade, ER/PR positive DCIS of the left breast   (as documented in provider EOT note)  The patient was not available for call today. Voicemail left.  The patient was encouraged to avoid sun exposure in the area of prior treatment for up to one year following radiation with either sunscreen or by the style of clothing worn in the sun.  The patient has scheduled follow up with her medical oncologist Dr. Al Pimple for ongoing surveillance, and was encouraged to call if she develops concerns or questions regarding radiation.    Ruel Favors, LPN

## 2023-05-29 ENCOUNTER — Inpatient Hospital Stay: Payer: 59 | Attending: Hematology and Oncology | Admitting: Adult Health

## 2023-05-29 ENCOUNTER — Encounter: Payer: Self-pay | Admitting: *Deleted

## 2023-05-29 DIAGNOSIS — J4489 Other specified chronic obstructive pulmonary disease: Secondary | ICD-10-CM | POA: Insufficient documentation

## 2023-05-29 DIAGNOSIS — Z86718 Personal history of other venous thrombosis and embolism: Secondary | ICD-10-CM | POA: Insufficient documentation

## 2023-05-29 DIAGNOSIS — Z8041 Family history of malignant neoplasm of ovary: Secondary | ICD-10-CM | POA: Insufficient documentation

## 2023-05-29 DIAGNOSIS — Z90710 Acquired absence of both cervix and uterus: Secondary | ICD-10-CM | POA: Insufficient documentation

## 2023-05-29 DIAGNOSIS — D0512 Intraductal carcinoma in situ of left breast: Secondary | ICD-10-CM | POA: Insufficient documentation

## 2023-05-29 DIAGNOSIS — Z825 Family history of asthma and other chronic lower respiratory diseases: Secondary | ICD-10-CM | POA: Insufficient documentation

## 2023-05-29 DIAGNOSIS — F17210 Nicotine dependence, cigarettes, uncomplicated: Secondary | ICD-10-CM | POA: Insufficient documentation

## 2023-05-29 DIAGNOSIS — I10 Essential (primary) hypertension: Secondary | ICD-10-CM | POA: Insufficient documentation

## 2023-05-29 DIAGNOSIS — Z8042 Family history of malignant neoplasm of prostate: Secondary | ICD-10-CM | POA: Insufficient documentation

## 2023-05-29 DIAGNOSIS — Z8249 Family history of ischemic heart disease and other diseases of the circulatory system: Secondary | ICD-10-CM | POA: Insufficient documentation

## 2023-05-29 DIAGNOSIS — Z8051 Family history of malignant neoplasm of kidney: Secondary | ICD-10-CM | POA: Insufficient documentation

## 2023-05-29 DIAGNOSIS — Z833 Family history of diabetes mellitus: Secondary | ICD-10-CM | POA: Insufficient documentation

## 2023-05-29 DIAGNOSIS — Z79899 Other long term (current) drug therapy: Secondary | ICD-10-CM | POA: Insufficient documentation

## 2023-05-29 DIAGNOSIS — Z79811 Long term (current) use of aromatase inhibitors: Secondary | ICD-10-CM | POA: Insufficient documentation

## 2023-05-29 DIAGNOSIS — Z885 Allergy status to narcotic agent status: Secondary | ICD-10-CM | POA: Insufficient documentation

## 2023-05-29 DIAGNOSIS — N644 Mastodynia: Secondary | ICD-10-CM | POA: Insufficient documentation

## 2023-05-29 NOTE — Progress Notes (Deleted)
SURVIVORSHIP VISIT:  BRIEF ONCOLOGIC HISTORY:  Oncology History  Ductal carcinoma in situ (DCIS) of left breast  07/04/2022 Mammogram   Screening mammogram suggested further evaluation for calcifications in the left breast.  She then had a diagnostic mammogram which showed 4.8 cm span of indeterminate calcifications in the left breast warranting tissue diagnosis.   08/23/2022 Pathology Results   Left breast needle core biopsy showed grade 3 DCIS, necrosis and calcifications present, prognostic showed ER 100% positive strong staining PR 60% positive moderate to strong staining   09/06/2022 Genetic Testing   Negative genetic testing on the Multi-Cancer+RNA gene panel.  CHEK2 c.164C>T (p.Ser55Phe), CHEK2 c.542G>A (p.Arg181His), MSH3 c.2600T>C (p.Ile867Thr), and RET c.2611G>A (p.Val871Ile) VUS were identified.  The report date is September 06, 2022.  The Multi-Cancer + RNA Panel offered by [facility] includes sequencing and/or deletion/duplication analysis of the following 70 genes:  AIP*, ALK, APC*, ATM*, AXIN2*, BAP1*, BARD1*, BLM*, BMPR1A*, BRCA1*, BRCA2*, BRIP1*, CDC73*, CDH1*, CDK4, CDKN1B*, CDKN2A, CHEK2*, CTNNA1*, DICER1*, EPCAM (del/dup only), EGFR, FH*, FLCN*, GREM1 (promoter dup only), HOXB13, KIT, LZTR1, MAX*, MBD4, MEN1*, MET, MITF, MLH1*, MSH2*, MSH3*, MSH6*, MUTYH*, NF1*, NF2*, NTHL1*, PALB2*, PDGFRA, PMS2*, POLD1*, POLE*, POT1*, PRKAR1A*, PTCH1*, PTEN*, RAD51C*, RAD51D*, RB1*, RET, SDHA* (sequencing only), SDHAF2*, SDHB*, SDHC*, SDHD*, SMAD4*, SMARCA4*, SMARCB1*, SMARCE1*, STK11*, SUFU*, TMEM127*, TP53*, TSC1*, TSC2*, VHL*. RNA analysis is performed for * genes.    11/25/2022 Surgery   DCIS, 4cm, intermediate to high grade, with necrosis, ER/PR psoitive   01/12/2023 - 02/10/2023 Radiation Therapy   Plan Name: Breast_L_BH Site: Breast, Left Technique: 3D Mode: Photon Dose Per Fraction: 2.66 Gy Prescribed Dose (Delivered / Prescribed): 42.56 Gy / 42.56 Gy Prescribed Fxs (Delivered / Prescribed):  16 / 16   Plan Name: Brst_L_Bst_BH Site: Breast, Left Technique: 3D Mode: Photon Dose Per Fraction: 2 Gy Prescribed Dose (Delivered / Prescribed): 8 Gy / 8 Gy Prescribed Fxs (Delivered / Prescribed): 4 / 4   03/2023 -  Anti-estrogen oral therapy   Anastrozole     INTERVAL HISTORY:    Discussed the use of AI scribe software for clinical note transcription with the patient, who gave verbal consent to proceed.  [name] to review her survivorship care plan detailing her treatment course for breast cancer, as well as monitoring long-term side effects of that treatment, education regarding health maintenance, screening, and overall wellness and health promotion.     Overall, [name] reports feeling quite well   REVIEW OF SYSTEMS:  Review of Systems  Constitutional:  Negative for appetite change, chills, fatigue, fever and unexpected weight change.  HENT:   Negative for hearing loss, lump/mass and trouble swallowing.   Eyes:  Negative for eye problems and icterus.  Respiratory:  Negative for chest tightness, cough and shortness of breath.   Cardiovascular:  Negative for chest pain, leg swelling and palpitations.  Gastrointestinal:  Negative for abdominal distention, abdominal pain, constipation, diarrhea, nausea and vomiting.  Endocrine: Negative for hot flashes.  Genitourinary:  Negative for difficulty urinating.   Musculoskeletal:  Negative for arthralgias.  Skin:  Negative for itching and rash.  Neurological:  Negative for dizziness, extremity weakness, headaches and numbness.  Hematological:  Negative for adenopathy. Does not bruise/bleed easily.  Psychiatric/Behavioral:  Negative for depression. The patient is not nervous/anxious.    Breast: Denies any new nodularity, masses, tenderness, nipple changes, or nipple discharge.       PAST MEDICAL/SURGICAL HISTORY:  Past Medical History:  Diagnosis Date   Asthma    Breast cancer (HCC)  08/23/2022   Bronchitis,  asthmatic    COPD (chronic obstructive pulmonary disease) (HCC)    Coronary artery calcification seen on CT scan    Diabetes (HCC)    DVT (deep venous thrombosis) (HCC)    Family history of kidney cancer    Family history of ovarian cancer    Family history of prostate cancer    Hyperlipidemia    Hypertension    Observed sleep apnea    Past Surgical History:  Procedure Laterality Date   ABDOMINAL HYSTERECTOMY     BREAST BIOPSY Left 08/23/2022   MM LT BREAST BX W LOC DEV 1ST LESION IMAGE BX SPEC STEREO GUIDE 08/23/2022 [facility]   BREAST BIOPSY Left 08/23/2022   MM LT BREAST BX W LOC DEV EA AD LESION IMG BX SPEC STEREO GUIDE 08/23/2022 [facility]   BREAST BIOPSY  11/22/2022   MM LT RADIOACTIVE SEED EA ADD LESION LOC MAMMO GUIDE 11/22/2022 [facility]   BREAST BIOPSY  11/22/2022   MM LT RADIOACTIVE SEED LOC MAMMO GUIDE 11/22/2022 [facility]   BREAST LUMPECTOMY WITH RADIOACTIVE SEED LOCALIZATION Left 11/23/2022   Procedure: LEFT BREAST SEED BRACKETED CENTRAL LUMPECTOMY AND NIPPLE RESECTION;  Surgeon: Harriette Bouillon, MD;  Location: [facility];  Service: General;  Laterality: Left;   KNEE ARTHROSCOPY Bilateral    TOTAL KNEE ARTHROPLASTY Bilateral      ALLERGIES:  Allergies  Allergen Reactions   Codeine     REACTION: itch   Codeine Phosphate     REACTION: itching   Tramadol-Acetaminophen     REACTION: itch     CURRENT MEDICATIONS:  Outpatient Encounter Medications as of 05/29/2023  Medication Sig Note   amLODipine (NORVASC) 10 MG tablet amlodipine 10 mg tablet    anastrozole (ARIMIDEX) 1 MG tablet Take 1 tablet (1 mg total) by mouth daily.    celecoxib (CELEBREX) 400 MG capsule Take 400 mg by mouth 2 (two) times daily.    cyclobenzaprine (FLEXERIL) 10 MG tablet Take 5-10 mg by mouth 3 (three) times daily as needed for muscle spasms.    diclofenac Sodium (VOLTAREN) 1 % GEL Apply 1 application topically 4 (four) times daily.     DULoxetine (CYMBALTA) 60 MG capsule Take 120 mg by mouth daily.    esomeprazole (NEXIUM) 40 MG capsule esomeprazole magnesium 40 mg capsule,delayed release  TAKE 1 CAPSULE BY MOUTH EVERY DAY    furosemide (LASIX) 40 MG tablet Take 40 mg by mouth daily.    hydrALAZINE (APRESOLINE) 25 MG tablet Take 25 mg by mouth daily.    HYDROcodone-acetaminophen (NORCO/VICODIN) 5-325 MG tablet Take 1 tablet by mouth 2 (two) times daily as needed.    isosorbide mononitrate (IMDUR) 60 MG 24 hr tablet Take 1 tablet (60 mg total) by mouth daily. Patient needs an appointment for further refills. 2 nd attempt    lidocaine (LIDODERM) 5 % 1 patch every 12 (twelve) hours.    losartan (COZAAR) 100 MG tablet Take 100 mg by mouth daily.    metoprolol succinate (TOPROL-XL) 25 MG 24 hr tablet Take 1 tablet (25 mg total) by mouth daily. 11/15/2022: Pt states made her extremely lightheaded and fatigued   MOUNJARO 5 MG/0.5ML Pen Inject 5 mg into the skin once a week.    Multiple Vitamins-Minerals (CENTRUM ADULTS PO) Take by mouth daily.    nitroGLYCERIN (NITROSTAT) 0.4 MG SL tablet Place 1 tablet (0.4 mg total) under the tongue every 5 (five) minutes as needed.    nortriptyline (PAMELOR) 50  MG capsule Take by mouth at bedtime as needed.    ondansetron (ZOFRAN) 4 MG tablet Take 4 mg by mouth every 8 (eight) hours as needed for nausea.    oxyCODONE (OXY IR/ROXICODONE) 5 MG immediate release tablet Take 1 tablet (5 mg total) by mouth every 6 (six) hours as needed for severe pain.    potassium chloride (KLOR-CON M) 10 MEQ tablet 10 mEq daily as needed (When taking lasix for edema).    rivaroxaban (XARELTO) 20 MG TABS tablet 20 mg daily with supper.    rosuvastatin (CRESTOR) 20 MG tablet Take 20 mg by mouth daily.    Tiotropium Bromide-Olodaterol (STIOLTO RESPIMAT) 2.5-2.5 MCG/ACT AERS INHALE 2 PUFFS BY MOUTH INTO THE LUNGS DAILY    Vitamin D, Ergocalciferol, (DRISDOL) 1.25 MG (50000 UNIT) CAPS capsule Take 50,000 Units by mouth once  a week.    No facility-administered encounter medications on file as of 05/29/2023.     ONCOLOGIC FAMILY HISTORY:  Family History  Problem Relation Age of Onset   Pulmonary embolism Mother    Heart Problems Mother    Hypertension Mother    Heart attack Father    Diabetes Father    Pulmonary embolism Sister    Healthy Sister    Pulmonary embolism Brother    Prostate cancer Brother 78   Kidney cancer Brother 57   Pulmonary embolism Brother    Ovarian cancer Maternal Aunt      SOCIAL HISTORY:  Social History   Socioeconomic History   Marital status: Single    Spouse name: Not on file   Number of children: Not on file   Years of education: Not on file   Highest education level: Not on file  Occupational History   Not on file  Tobacco Use   Smoking status: Every Day    Current packs/day: 1.50    Average packs/day: 1.5 packs/day for 59.0 years (88.5 ttl pk-yrs)    Types: Cigarettes    Start date: 06/07/1964    Passive exposure: Current   Smokeless tobacco: Never  Vaping Use   Vaping status: Never Used  Substance and Sexual Activity   Alcohol use: Not Currently   Drug use: Not Currently    Types: Marijuana, "Crack" cocaine    Comment: None since 1999   Sexual activity: Not on file  Other Topics Concern   Not on file  Social History Narrative   Not on file   Social Drivers of Health   Financial Resource Strain: Not on file  Food Insecurity: Food Insecurity Present (09/13/2022)   Hunger Vital Sign    Worried About Running Out of Food in the Last Year: Never true    Ran Out of Food in the Last Year: Sometimes true  Transportation Needs: No Transportation Needs (09/13/2022)   PRAPARE - Administrator, Civil Service (Medical): No    Lack of Transportation (Non-Medical): No  Physical Activity: Not on file  Stress: Not on file  Social Connections: Not on file  Intimate Partner Violence: Not At Risk (09/13/2022)   Humiliation, Afraid, Rape, and Kick  questionnaire    Fear of Current or Ex-Partner: No    Emotionally Abused: No    Physically Abused: No    Sexually Abused: No     OBSERVATIONS/OBJECTIVE:  There were no vitals taken for this visit. GENERAL: Patient is a well appearing female in no acute distress HEENT:  Sclerae anicteric.  Oropharynx clear and moist. No ulcerations or evidence of  oropharyngeal candidiasis. Neck is supple.  NODES:  No cervical, supraclavicular, or axillary lymphadenopathy palpated.  BREAST EXAM: Left breast status postlumpectomy and radiation no sign of local recurrence right breast is benign. LUNGS:  Clear to auscultation bilaterally.  No wheezes or rhonchi. HEART:  Regular rate and rhythm. No murmur appreciated. ABDOMEN:  Soft, nontender.  Positive, normoactive bowel sounds. No organomegaly palpated. MSK:  No focal spinal tenderness to palpation. Full range of motion bilaterally in the upper extremities. EXTREMITIES:  No peripheral edema.   SKIN:  Clear with no obvious rashes or skin changes. No nail dyscrasia. NEURO:  Nonfocal. Well oriented.  Appropriate affect.   LABORATORY DATA:  None for this visit.  DIAGNOSTIC IMAGING:  None for this visit.      ASSESSMENT AND PLAN:  Ms.. [name] is a pleasant 68 y.o. female with Stage 0 left breast DCIS, ER+/PR+, diagnosed in 06/2022, treated with lumpectomy, adjuvant radiation therapy, and anti-estrogen therapy with Anastrozole beginning in 03/2023.  She presents to the Survivorship Clinic for our initial meeting and routine follow-up post-completion of treatment for breast cancer.    1. Stage 0 left breast cancer:  [name] is continuing to recover from definitive treatment for breast cancer. She will follow-up with her medical oncologist, Dr.  Al Pimple in 6 months with history and physical exam per surveillance protocol.  She will continue her anti-estrogen therapy with Anastrozole. Thus far, she is tolerating the Anastrozole well, with minimal side  effects. Her mammogram is due 08/2023; orders placed today.   Today, a comprehensive survivorship care plan and treatment summary was reviewed with the patient today detailing her breast cancer diagnosis, treatment course, potential late/long-term effects of treatment, appropriate follow-up care with recommendations for the future, and patient education resources.  A copy of this summary, along with a letter will be sent to the patient's primary care provider via mail/fax/In Basket message after today's visit.    #. Problem(s) at Visit______________  #. Bone health:  Given [name]'s age/history of breast cancer and her current treatment regimen including anti-estrogen therapy with Anastrozole, she is at risk for bone demineralization.  Scheduled for bone density testing August 14, 2023.Marland Kitchen  She was given education on specific activities to promote bone health.  #. Cancer screening:  Due to [name]'s history and her age, she should receive screening for skin cancers, colon cancer, and gynecologic cancers.  The information and recommendations are listed on the patient's comprehensive care plan/treatment summary and were reviewed in detail with the patient.    #. Health maintenance and wellness promotion: [name] was encouraged to consume 5-7 servings of fruits and vegetables per day. We reviewed the "Nutrition Rainbow" handout.  She was also encouraged to engage in moderate to vigorous exercise for 30 minutes per day most days of the week.  She was instructed to limit her alcohol consumption and was recommended to quit smoking.     #. Support services/counseling: It is not uncommon for this period of the patient's cancer care trajectory to be one of many emotions and stressors.   She was given information regarding our available services and encouraged to contact me with any questions or for help enrolling in any of our support group/programs.    Follow up instructions:    -Return to cancer  center ***  -Mammogram due in *** -She is welcome to return back to the Survivorship Clinic at any time; no additional follow-up needed at this time.  -Consider referral back to survivorship  as a long-term survivor for continued surveillance  The patient was provided an opportunity to ask questions and all were answered. The patient agreed with the plan and demonstrated an understanding of the instructions.   Total encounter time:*** minutes*in face-to-face visit time, chart review, lab review, care coordination, order entry, and documentation of the encounter time.    Lillard Anes, NP 05/29/23 9:13 AM Medical Oncology and Hematology [facility] [address] [place], [place] [address] Tel. [phone]    Fax. [phone]  *Total Encounter Time as defined by the Centers for Medicare and Medicaid Services includes, in addition to the face-to-face time of a patient visit (documented in the note above) non-face-to-face time: obtaining and reviewing outside history, ordering and reviewing medications, tests or procedures, care coordination (communications with other health care professionals or caregivers) and documentation in the medical record.

## 2023-05-30 ENCOUNTER — Inpatient Hospital Stay (HOSPITAL_BASED_OUTPATIENT_CLINIC_OR_DEPARTMENT_OTHER): Payer: 59 | Admitting: Hematology and Oncology

## 2023-05-30 ENCOUNTER — Encounter: Payer: 59 | Admitting: Adult Health

## 2023-05-30 VITALS — BP 132/56 | HR 86 | Temp 98.7°F | Resp 18 | Wt 224.9 lb

## 2023-05-30 DIAGNOSIS — Z8042 Family history of malignant neoplasm of prostate: Secondary | ICD-10-CM | POA: Diagnosis not present

## 2023-05-30 DIAGNOSIS — Z79811 Long term (current) use of aromatase inhibitors: Secondary | ICD-10-CM | POA: Diagnosis not present

## 2023-05-30 DIAGNOSIS — Z833 Family history of diabetes mellitus: Secondary | ICD-10-CM | POA: Diagnosis not present

## 2023-05-30 DIAGNOSIS — I10 Essential (primary) hypertension: Secondary | ICD-10-CM | POA: Diagnosis not present

## 2023-05-30 DIAGNOSIS — Z8041 Family history of malignant neoplasm of ovary: Secondary | ICD-10-CM | POA: Diagnosis not present

## 2023-05-30 DIAGNOSIS — F17210 Nicotine dependence, cigarettes, uncomplicated: Secondary | ICD-10-CM | POA: Diagnosis not present

## 2023-05-30 DIAGNOSIS — Z79899 Other long term (current) drug therapy: Secondary | ICD-10-CM | POA: Diagnosis not present

## 2023-05-30 DIAGNOSIS — N644 Mastodynia: Secondary | ICD-10-CM | POA: Diagnosis not present

## 2023-05-30 DIAGNOSIS — Z90710 Acquired absence of both cervix and uterus: Secondary | ICD-10-CM | POA: Diagnosis not present

## 2023-05-30 DIAGNOSIS — Z8051 Family history of malignant neoplasm of kidney: Secondary | ICD-10-CM | POA: Diagnosis not present

## 2023-05-30 DIAGNOSIS — D0512 Intraductal carcinoma in situ of left breast: Secondary | ICD-10-CM

## 2023-05-30 DIAGNOSIS — Z86718 Personal history of other venous thrombosis and embolism: Secondary | ICD-10-CM | POA: Diagnosis not present

## 2023-05-30 DIAGNOSIS — J4489 Other specified chronic obstructive pulmonary disease: Secondary | ICD-10-CM | POA: Diagnosis not present

## 2023-05-30 DIAGNOSIS — Z8249 Family history of ischemic heart disease and other diseases of the circulatory system: Secondary | ICD-10-CM | POA: Diagnosis not present

## 2023-05-30 DIAGNOSIS — Z885 Allergy status to narcotic agent status: Secondary | ICD-10-CM | POA: Diagnosis not present

## 2023-05-30 DIAGNOSIS — Z825 Family history of asthma and other chronic lower respiratory diseases: Secondary | ICD-10-CM | POA: Diagnosis not present

## 2023-05-30 NOTE — Assessment & Plan Note (Signed)
This is a pleasant 68 year old female patient with DCIS status postlumpectomy, adjuvant radiation, could not tolerate anastrozole, DCIS [facility] nomogram suggests 3% risk of recurrence at 5 years and 5% risk of recurrence at 10 years now here for an interim follow-up.  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 Likely due to nerve regeneration. -Expect resolution over the next year.  No other concerns for breast cancer recurrence.  Breast Cancer Grade 3, treated with surgery and radiation. No family history of breast cancer. Discontinued hormonal therapy due to side effects. Discussed the risk of recurrence (3% at 5 years, 5% at 10 years) and the patient declined further hormonal therapy. -Continue surveillance with no further hormonal therapy. -Order mammogram in April 2025.  Follow-up in 6 months.

## 2023-05-30 NOTE — Progress Notes (Signed)
[name] CONSULT NOTE  Patient Care Team: Jackie Plum, MD as PCP - General (Internal Medicine) Dulce Sellar Iline Oven, MD as Consulting Physician (Cardiology) Harriette Bouillon, MD as Consulting Physician (General Surgery) Dorothy Puffer, MD as Consulting Physician (Radiation Oncology) Rachel Moulds, MD as Consulting Physician (Hematology and Oncology)  CHIEF COMPLAINTS/PURPOSE OF CONSULTATION:  DCIS  ASSESSMENT & PLAN:  Ductal carcinoma in situ (DCIS) of left breast This is a pleasant 68 year old female patient with DCIS status postlumpectomy, adjuvant radiation, could not tolerate anastrozole, DCIS MSKCC nomogram suggests 3% risk of recurrence at 5 years and 5% risk of recurrence at 10 years now here for an interim follow-up.  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 Likely due to nerve regeneration. -Expect resolution over the next year.  No other concerns for breast cancer recurrence.  Breast Cancer Grade 3, treated with surgery and radiation. No family history of breast cancer. Discontinued hormonal therapy due to side effects. Discussed the risk of recurrence (3% at 5 years, 5% at 10 years) and the patient declined further hormonal therapy. -Continue surveillance with no further hormonal therapy. -Order mammogram in April 2025.  Follow-up in 6 months.   Orders Placed This Encounter  Procedures   MM DIAG BREAST TOMO BILATERAL    [facility] PF; : 07/04/2022@[facility] / NO NEEDS/ NO ISSUES/ YES HX OF BR CANCER/  EPIC ORDER    Standing Status:   Future    Expected Date:   08/23/2023    Expiration Date:   05/29/2024    Reason for Exam (SYMPTOM  OR DIAGNOSIS REQUIRED):   History of left breast DCIS    Preferred imaging [facility]?:   [facility]     HISTORY OF PRESENTING ILLNESS:  [name] 68 y.o. female is here because of DCIS  Oncology History  Ductal carcinoma in situ (DCIS) of left breast  07/04/2022  Mammogram   Screening mammogram suggested further evaluation for calcifications in the left breast.  She then had a diagnostic mammogram which showed 4.8 cm span of indeterminate calcifications in the left breast warranting tissue diagnosis.   08/23/2022 Pathology Results   Left breast needle core biopsy showed grade 3 DCIS, necrosis and calcifications present, prognostic showed ER 100% positive strong staining PR 60% positive moderate to strong staining   09/06/2022 Genetic Testing   Negative genetic testing on the Multi-Cancer+RNA gene panel.  CHEK2 c.164C>T (p.Ser55Phe), CHEK2 c.542G>A (p.Arg181His), MSH3 c.2600T>C (p.Ile867Thr), and RET c.2611G>A (p.Val871Ile) VUS were identified.  The report date is September 06, 2022.  The Multi-Cancer + RNA Panel offered by [facility] includes sequencing and/or deletion/duplication analysis of the following 70 genes:  AIP*, ALK, APC*, ATM*, AXIN2*, BAP1*, BARD1*, BLM*, BMPR1A*, BRCA1*, BRCA2*, BRIP1*, CDC73*, CDH1*, CDK4, CDKN1B*, CDKN2A, CHEK2*, CTNNA1*, DICER1*, EPCAM (del/dup only), EGFR, FH*, FLCN*, GREM1 (promoter dup only), HOXB13, KIT, LZTR1, MAX*, MBD4, MEN1*, MET, MITF, MLH1*, MSH2*, MSH3*, MSH6*, MUTYH*, NF1*, NF2*, NTHL1*, PALB2*, PDGFRA, PMS2*, POLD1*, POLE*, POT1*, PRKAR1A*, PTCH1*, PTEN*, RAD51C*, RAD51D*, RB1*, RET, SDHA* (sequencing only), SDHAF2*, SDHB*, SDHC*, SDHD*, SMAD4*, SMARCA4*, SMARCB1*, SMARCE1*, STK11*, SUFU*, TMEM127*, TP53*, TSC1*, TSC2*, VHL*. RNA analysis is performed for * genes.    11/23/2022 Surgery   DCIS, 4cm, intermediate to high grade, with necrosis, ER/PR psoitive   11/23/2022 Cancer Staging   Staging form: Breast, AJCC 8th Edition - Pathologic stage from 11/23/2022: Stage 0 (pTis (DCIS), pN0, cM0, ER+, PR+) - Signed by Loa Socks, NP on 05/29/2023 Stage prefix: Initial diagnosis   01/12/2023 -  02/10/2023 Radiation Therapy   Plan Name: Breast_L_BH Site: Breast, Left Technique: 3D Mode: Photon Dose Per Fraction: 2.66  Gy Prescribed Dose (Delivered / Prescribed): 42.56 Gy / 42.56 Gy Prescribed Fxs (Delivered / Prescribed): 16 / 16   Plan Name: Brst_L_Bst_BH Site: Breast, Left Technique: 3D Mode: Photon Dose Per Fraction: 2 Gy Prescribed Dose (Delivered / Prescribed): 8 Gy / 8 Gy Prescribed Fxs (Delivered / Prescribed): 4 / 4   03/2023 -  Anti-estrogen oral therapy   Anastrozole    At baseline she has COPD, diabetes, 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  No history of breast cancer in the family however several other cancers run in the family.  Maternal aunt had ovarian cancer, sister had renal cancer, dad had metastatic carcinoma of unknown primary.  She has 2 children, 2 boys.  No history of hormone replacement therapy.  She complains of occasional back pain for which she takes prednisone, otherwise significant history of DVT for which she stayed on Coumadin and PE subsequently hence she was recommended indefinite anticoagulation.  There is also strong family history of blood clots.  Sister had saddle pulmonary embolism.    She had left breast lumpectomy which showed DCIS, cribriform and solid intermediate to high nuclear grade with necrosis, DCIS measured 4 cm, negative margins, ER/PR positive.  ER 100% positive strong staining PR 40% positive moderate to strong staining.    Interval history  Discussed the use of AI scribe software for clinical note transcription with the patient, who gave verbal consent to proceed.  History of Present Illness    The patient, with a history of breast cancer, presents with post-operative breast pain and medication side effects. She reports sharp, shooting pains in the breast several times a day, which she describes as 'a lot of pain.' The pain has been ongoing since her surgery in July and subsequent radiation therapy, which concluded in October. The patient also reports  experiencing headaches, nausea, body aches, fatigue, and numbness in one hand while on anastrozole, which she discontinued last month. Since discontinuing the medication, the patient reports feeling better and the side effects have subsided.  The patient also takes a blood thinner and has not seen the breast surgeon since the surgery. She expresses a strong aversion to taking more medication, particularly due to the severe side effects she experienced.  Rest of the pertinent 10 point ROS reviewed and negative  MEDICAL HISTORY:  Past Medical History:  Diagnosis Date   Asthma    Breast cancer (HCC) 08/23/2022   Bronchitis, asthmatic    COPD (chronic obstructive pulmonary disease) (HCC)    Coronary artery calcification seen on CT scan    Diabetes (HCC)    DVT (deep venous thrombosis) (HCC)    Family history of kidney cancer    Family history of ovarian cancer    Family history of prostate cancer    Hyperlipidemia    Hypertension    Observed sleep apnea     SURGICAL HISTORY: Past Surgical History:  Procedure Laterality Date   ABDOMINAL HYSTERECTOMY     BREAST BIOPSY Left 08/23/2022   MM LT BREAST BX W LOC DEV 1ST LESION IMAGE BX SPEC STEREO GUIDE 08/23/2022 [facility]   BREAST BIOPSY Left 08/23/2022   MM LT BREAST BX W LOC DEV EA AD LESION IMG BX SPEC STEREO GUIDE 08/23/2022 [facility]   BREAST BIOPSY  11/22/2022   MM LT RADIOACTIVE SEED EA ADD LESION LOC MAMMO GUIDE 11/22/2022 [facility]   BREAST BIOPSY  11/22/2022   MM LT RADIOACTIVE SEED LOC MAMMO GUIDE 11/22/2022 [facility]   BREAST LUMPECTOMY WITH RADIOACTIVE SEED LOCALIZATION Left 11/23/2022   Procedure: LEFT BREAST SEED BRACKETED CENTRAL LUMPECTOMY AND NIPPLE RESECTION;  Surgeon: Harriette Bouillon, MD;  Location: [facility];  Service: General;  Laterality: Left;   KNEE ARTHROSCOPY Bilateral    TOTAL KNEE ARTHROPLASTY Bilateral     SOCIAL HISTORY: Social History   Socioeconomic  History   Marital status: Single    Spouse name: Not on file   Number of children: Not on file   Years of education: Not on file   Highest education level: Not on file  Occupational History   Not on file  Tobacco Use   Smoking status: Every Day    Current packs/day: 1.50    Average packs/day: 1.5 packs/day for 59.0 years (88.5 ttl pk-yrs)    Types: Cigarettes    Start date: 06/07/1964    Passive exposure: Current   Smokeless tobacco: Never  Vaping Use   Vaping status: Never Used  Substance and Sexual Activity   Alcohol use: Not Currently   Drug use: Not Currently    Types: Marijuana, "Crack" cocaine    Comment: None since 1999   Sexual activity: Not on file  Other Topics Concern   Not on file  Social History Narrative   Not on file   Social Drivers of Health   Financial Resource Strain: Not on file  Food Insecurity: Food Insecurity Present (09/13/2022)   Hunger Vital Sign    Worried About Running Out of Food in the Last Year: Never true    Ran Out of Food in the Last Year: Sometimes true  Transportation Needs: No Transportation Needs (09/13/2022)   PRAPARE - Administrator, Civil Service (Medical): No    Lack of Transportation (Non-Medical): No  Physical Activity: Not on file  Stress: Not on file  Social Connections: Not on file  Intimate Partner Violence: Not At Risk (09/13/2022)   Humiliation, Afraid, Rape, and Kick questionnaire    Fear of Current or Ex-Partner: No    Emotionally Abused: No    Physically Abused: No    Sexually Abused: No    FAMILY HISTORY: Family History  Problem Relation Age of Onset   Pulmonary embolism Mother    Heart Problems Mother    Hypertension Mother    Heart attack Father    Diabetes Father    Pulmonary embolism Sister    Healthy Sister    Pulmonary embolism Brother    Prostate cancer Brother 70   Kidney cancer Brother 55   Pulmonary embolism Brother    Ovarian cancer Maternal Aunt     ALLERGIES:  is allergic to  codeine, codeine phosphate, and tramadol-acetaminophen.  MEDICATIONS:  Current Outpatient Medications  Medication Sig Dispense Refill   amLODipine (NORVASC) 10 MG tablet amlodipine 10 mg tablet     anastrozole (ARIMIDEX) 1 MG tablet Take 1 tablet (1 mg total) by mouth daily. 90 tablet 3   celecoxib (CELEBREX) 400 MG capsule Take 400 mg by mouth 2 (two) times daily.     cyclobenzaprine (FLEXERIL) 10 MG tablet Take 5-10 mg by mouth 3 (three) times daily as needed for muscle spasms.     diclofenac Sodium (VOLTAREN) 1 % GEL Apply 1 application topically 4 (four) times daily.  DULoxetine (CYMBALTA) 60 MG capsule Take 120 mg by mouth daily.     esomeprazole (NEXIUM) 40 MG capsule esomeprazole magnesium 40 mg capsule,delayed release  TAKE 1 CAPSULE BY MOUTH EVERY DAY     furosemide (LASIX) 40 MG tablet Take 40 mg by mouth daily.     hydrALAZINE (APRESOLINE) 25 MG tablet Take 25 mg by mouth daily.     HYDROcodone-acetaminophen (NORCO/VICODIN) 5-325 MG tablet Take 1 tablet by mouth 2 (two) times daily as needed.     isosorbide mononitrate (IMDUR) 60 MG 24 hr tablet Take 1 tablet (60 mg total) by mouth daily. Patient needs an appointment for further refills. 2 nd attempt 15 tablet 0   lidocaine (LIDODERM) 5 % 1 patch every 12 (twelve) hours.     losartan (COZAAR) 100 MG tablet Take 100 mg by mouth daily.     metoprolol succinate (TOPROL-XL) 25 MG 24 hr tablet Take 1 tablet (25 mg total) by mouth daily. 30 tablet 1   MOUNJARO 5 MG/0.5ML Pen Inject 5 mg into the skin once a week.     Multiple Vitamins-Minerals (CENTRUM ADULTS PO) Take by mouth daily.     nitroGLYCERIN (NITROSTAT) 0.4 MG SL tablet Place 1 tablet (0.4 mg total) under the tongue every 5 (five) minutes as needed. 30 tablet 3   nortriptyline (PAMELOR) 50 MG capsule Take by mouth at bedtime as needed.     ondansetron (ZOFRAN) 4 MG tablet Take 4 mg by mouth every 8 (eight) hours as needed for nausea.     oxyCODONE (OXY IR/ROXICODONE) 5 MG  immediate release tablet Take 1 tablet (5 mg total) by mouth every 6 (six) hours as needed for severe pain. 15 tablet 0   potassium chloride (KLOR-CON M) 10 MEQ tablet 10 mEq daily as needed (When taking lasix for edema).     rivaroxaban (XARELTO) 20 MG TABS tablet 20 mg daily with supper.     rosuvastatin (CRESTOR) 20 MG tablet Take 20 mg by mouth daily.     Tiotropium Bromide-Olodaterol (STIOLTO RESPIMAT) 2.5-2.5 MCG/ACT AERS INHALE 2 PUFFS BY MOUTH INTO THE LUNGS DAILY 4 g 0   Vitamin D, Ergocalciferol, (DRISDOL) 1.25 MG (50000 UNIT) CAPS capsule Take 50,000 Units by mouth once a week.     No current facility-administered medications for this visit.     PHYSICAL EXAMINATION: ECOG PERFORMANCE STATUS: 0 - Asymptomatic  Vitals:   05/30/23 1157 05/30/23 1158  BP: (!) 153/68 (!) 132/56  Pulse: 86   Resp: 18   Temp: 98.7 F (37.1 C)   SpO2: 100%    Filed Weights   05/30/23 1157  Weight: 224 lb 14.4 oz (102 kg)    GENERAL:alert, no distress and comfortable, walks with a walker Both breasts examined. No concerns noted. Left breast s/p lumpectomy with loss of nipple. No regional adenopathy.  LABORATORY DATA:  I have reviewed the data as listed Lab Results  Component Value Date   WBC 4.0 08/31/2022   HGB 12.4 08/31/2022   HCT 38.4 08/31/2022   MCV 93.2 08/31/2022   PLT 208 08/31/2022     Chemistry      Component Value Date/Time   NA 140 11/18/2022 1257   NA 144 06/03/2021 0939   K 4.6 11/18/2022 1257   CL 108 11/18/2022 1257   CO2 26 11/18/2022 1257   BUN 12 11/18/2022 1257   BUN 12 06/03/2021 0939   CREATININE 0.92 11/18/2022 1257   CREATININE 0.97 08/31/2022 1000  Component Value Date/Time   CALCIUM 8.9 11/18/2022 1257   ALKPHOS 41 08/31/2022 1000   AST 35 08/31/2022 1000   ALT 31 08/31/2022 1000   BILITOT 0.3 08/31/2022 1000       RADIOGRAPHIC STUDIES: I have personally reviewed the radiological images as listed and agreed with the findings in the  report. No results found.  All questions were answered. The patient knows to call the clinic with any problems, questions or concerns. I spent 30 minutes in the care of this patient including H and P, review of records, counseling and coordination of care.     Rachel Moulds, MD 05/30/2023 1:31 PM

## 2023-06-07 DIAGNOSIS — M25552 Pain in left hip: Secondary | ICD-10-CM | POA: Diagnosis not present

## 2023-06-12 DIAGNOSIS — M545 Low back pain, unspecified: Secondary | ICD-10-CM | POA: Diagnosis not present

## 2023-06-12 DIAGNOSIS — N1832 Chronic kidney disease, stage 3b: Secondary | ICD-10-CM | POA: Diagnosis not present

## 2023-06-12 DIAGNOSIS — M25521 Pain in right elbow: Secondary | ICD-10-CM | POA: Diagnosis not present

## 2023-06-12 DIAGNOSIS — Z79899 Other long term (current) drug therapy: Secondary | ICD-10-CM | POA: Diagnosis not present

## 2023-06-12 DIAGNOSIS — M5432 Sciatica, left side: Secondary | ICD-10-CM | POA: Diagnosis not present

## 2023-06-12 DIAGNOSIS — G894 Chronic pain syndrome: Secondary | ICD-10-CM | POA: Diagnosis not present

## 2023-06-13 DIAGNOSIS — E1140 Type 2 diabetes mellitus with diabetic neuropathy, unspecified: Secondary | ICD-10-CM | POA: Diagnosis not present

## 2023-06-13 DIAGNOSIS — M1990 Unspecified osteoarthritis, unspecified site: Secondary | ICD-10-CM | POA: Diagnosis not present

## 2023-06-13 DIAGNOSIS — Z720 Tobacco use: Secondary | ICD-10-CM | POA: Diagnosis not present

## 2023-06-13 DIAGNOSIS — J449 Chronic obstructive pulmonary disease, unspecified: Secondary | ICD-10-CM | POA: Diagnosis not present

## 2023-06-13 DIAGNOSIS — J0100 Acute maxillary sinusitis, unspecified: Secondary | ICD-10-CM | POA: Diagnosis not present

## 2023-06-13 DIAGNOSIS — I10 Essential (primary) hypertension: Secondary | ICD-10-CM | POA: Diagnosis not present

## 2023-06-13 DIAGNOSIS — E785 Hyperlipidemia, unspecified: Secondary | ICD-10-CM | POA: Diagnosis not present

## 2023-06-13 DIAGNOSIS — Z7901 Long term (current) use of anticoagulants: Secondary | ICD-10-CM | POA: Diagnosis not present

## 2023-06-13 DIAGNOSIS — G629 Polyneuropathy, unspecified: Secondary | ICD-10-CM | POA: Diagnosis not present

## 2023-06-13 DIAGNOSIS — K5909 Other constipation: Secondary | ICD-10-CM | POA: Diagnosis not present

## 2023-06-13 DIAGNOSIS — K219 Gastro-esophageal reflux disease without esophagitis: Secondary | ICD-10-CM | POA: Diagnosis not present

## 2023-06-15 DIAGNOSIS — Z79899 Other long term (current) drug therapy: Secondary | ICD-10-CM | POA: Diagnosis not present

## 2023-06-21 DIAGNOSIS — G5601 Carpal tunnel syndrome, right upper limb: Secondary | ICD-10-CM | POA: Diagnosis not present

## 2023-06-27 DIAGNOSIS — Z720 Tobacco use: Secondary | ICD-10-CM | POA: Diagnosis not present

## 2023-06-27 DIAGNOSIS — E785 Hyperlipidemia, unspecified: Secondary | ICD-10-CM | POA: Diagnosis not present

## 2023-06-27 DIAGNOSIS — K219 Gastro-esophageal reflux disease without esophagitis: Secondary | ICD-10-CM | POA: Diagnosis not present

## 2023-06-27 DIAGNOSIS — K5909 Other constipation: Secondary | ICD-10-CM | POA: Diagnosis not present

## 2023-06-27 DIAGNOSIS — Z7901 Long term (current) use of anticoagulants: Secondary | ICD-10-CM | POA: Diagnosis not present

## 2023-06-27 DIAGNOSIS — J449 Chronic obstructive pulmonary disease, unspecified: Secondary | ICD-10-CM | POA: Diagnosis not present

## 2023-06-27 DIAGNOSIS — G629 Polyneuropathy, unspecified: Secondary | ICD-10-CM | POA: Diagnosis not present

## 2023-06-27 DIAGNOSIS — I10 Essential (primary) hypertension: Secondary | ICD-10-CM | POA: Diagnosis not present

## 2023-06-27 DIAGNOSIS — E1140 Type 2 diabetes mellitus with diabetic neuropathy, unspecified: Secondary | ICD-10-CM | POA: Diagnosis not present

## 2023-06-27 DIAGNOSIS — M1990 Unspecified osteoarthritis, unspecified site: Secondary | ICD-10-CM | POA: Diagnosis not present

## 2023-06-27 DIAGNOSIS — I2510 Atherosclerotic heart disease of native coronary artery without angina pectoris: Secondary | ICD-10-CM | POA: Diagnosis not present

## 2023-07-13 DIAGNOSIS — G8929 Other chronic pain: Secondary | ICD-10-CM | POA: Diagnosis not present

## 2023-07-13 DIAGNOSIS — Z79899 Other long term (current) drug therapy: Secondary | ICD-10-CM | POA: Diagnosis not present

## 2023-07-13 DIAGNOSIS — M5432 Sciatica, left side: Secondary | ICD-10-CM | POA: Diagnosis not present

## 2023-07-13 DIAGNOSIS — M545 Low back pain, unspecified: Secondary | ICD-10-CM | POA: Diagnosis not present

## 2023-07-13 DIAGNOSIS — M25521 Pain in right elbow: Secondary | ICD-10-CM | POA: Diagnosis not present

## 2023-07-13 DIAGNOSIS — N1832 Chronic kidney disease, stage 3b: Secondary | ICD-10-CM | POA: Diagnosis not present

## 2023-07-20 DIAGNOSIS — M5416 Radiculopathy, lumbar region: Secondary | ICD-10-CM | POA: Diagnosis not present

## 2023-07-27 ENCOUNTER — Other Ambulatory Visit: Payer: Self-pay | Admitting: Physician Assistant

## 2023-07-27 DIAGNOSIS — R1011 Right upper quadrant pain: Secondary | ICD-10-CM | POA: Diagnosis not present

## 2023-07-27 DIAGNOSIS — E1140 Type 2 diabetes mellitus with diabetic neuropathy, unspecified: Secondary | ICD-10-CM | POA: Diagnosis not present

## 2023-07-27 DIAGNOSIS — G629 Polyneuropathy, unspecified: Secondary | ICD-10-CM | POA: Diagnosis not present

## 2023-07-27 DIAGNOSIS — M1990 Unspecified osteoarthritis, unspecified site: Secondary | ICD-10-CM | POA: Diagnosis not present

## 2023-07-27 DIAGNOSIS — Z720 Tobacco use: Secondary | ICD-10-CM | POA: Diagnosis not present

## 2023-07-27 DIAGNOSIS — I10 Essential (primary) hypertension: Secondary | ICD-10-CM | POA: Diagnosis not present

## 2023-07-27 DIAGNOSIS — E785 Hyperlipidemia, unspecified: Secondary | ICD-10-CM | POA: Diagnosis not present

## 2023-07-27 DIAGNOSIS — K5909 Other constipation: Secondary | ICD-10-CM | POA: Diagnosis not present

## 2023-07-27 DIAGNOSIS — K219 Gastro-esophageal reflux disease without esophagitis: Secondary | ICD-10-CM | POA: Diagnosis not present

## 2023-07-27 DIAGNOSIS — R822 Biliuria: Secondary | ICD-10-CM | POA: Diagnosis not present

## 2023-07-27 DIAGNOSIS — J449 Chronic obstructive pulmonary disease, unspecified: Secondary | ICD-10-CM | POA: Diagnosis not present

## 2023-07-27 DIAGNOSIS — Z7901 Long term (current) use of anticoagulants: Secondary | ICD-10-CM | POA: Diagnosis not present

## 2023-08-02 ENCOUNTER — Ambulatory Visit
Admission: RE | Admit: 2023-08-02 | Discharge: 2023-08-02 | Disposition: A | Discharge status: Home / Self Care | Source: Ambulatory Visit | Attending: Physician Assistant | Admitting: Physician Assistant

## 2023-08-02 DIAGNOSIS — R1011 Right upper quadrant pain: Secondary | ICD-10-CM | POA: Diagnosis not present

## 2023-08-02 DIAGNOSIS — K8020 Calculus of gallbladder without cholecystitis without obstruction: Secondary | ICD-10-CM | POA: Diagnosis not present

## 2023-08-02 DIAGNOSIS — K2270 Barrett's esophagus without dysplasia: Secondary | ICD-10-CM | POA: Diagnosis not present

## 2023-08-02 DIAGNOSIS — K5909 Other constipation: Secondary | ICD-10-CM | POA: Diagnosis not present

## 2023-08-02 DIAGNOSIS — R1319 Other dysphagia: Secondary | ICD-10-CM | POA: Diagnosis not present

## 2023-08-08 DIAGNOSIS — M5432 Sciatica, left side: Secondary | ICD-10-CM | POA: Diagnosis not present

## 2023-08-08 DIAGNOSIS — M545 Low back pain, unspecified: Secondary | ICD-10-CM | POA: Diagnosis not present

## 2023-08-08 DIAGNOSIS — Z79899 Other long term (current) drug therapy: Secondary | ICD-10-CM | POA: Diagnosis not present

## 2023-08-08 DIAGNOSIS — N1832 Chronic kidney disease, stage 3b: Secondary | ICD-10-CM | POA: Diagnosis not present

## 2023-08-08 DIAGNOSIS — G894 Chronic pain syndrome: Secondary | ICD-10-CM | POA: Diagnosis not present

## 2023-08-08 DIAGNOSIS — M25521 Pain in right elbow: Secondary | ICD-10-CM | POA: Diagnosis not present

## 2023-08-08 DIAGNOSIS — G8929 Other chronic pain: Secondary | ICD-10-CM | POA: Diagnosis not present

## 2023-08-14 ENCOUNTER — Inpatient Hospital Stay: Admission: RE | Admit: 2023-08-14 | Payer: 59 | Source: Ambulatory Visit

## 2023-08-24 DIAGNOSIS — Z7901 Long term (current) use of anticoagulants: Secondary | ICD-10-CM | POA: Diagnosis not present

## 2023-08-24 DIAGNOSIS — J449 Chronic obstructive pulmonary disease, unspecified: Secondary | ICD-10-CM | POA: Diagnosis not present

## 2023-08-24 DIAGNOSIS — K219 Gastro-esophageal reflux disease without esophagitis: Secondary | ICD-10-CM | POA: Diagnosis not present

## 2023-08-24 DIAGNOSIS — E785 Hyperlipidemia, unspecified: Secondary | ICD-10-CM | POA: Diagnosis not present

## 2023-08-24 DIAGNOSIS — E1140 Type 2 diabetes mellitus with diabetic neuropathy, unspecified: Secondary | ICD-10-CM | POA: Diagnosis not present

## 2023-08-24 DIAGNOSIS — G629 Polyneuropathy, unspecified: Secondary | ICD-10-CM | POA: Diagnosis not present

## 2023-08-24 DIAGNOSIS — M1990 Unspecified osteoarthritis, unspecified site: Secondary | ICD-10-CM | POA: Diagnosis not present

## 2023-08-24 DIAGNOSIS — I10 Essential (primary) hypertension: Secondary | ICD-10-CM | POA: Diagnosis not present

## 2023-08-24 DIAGNOSIS — K5909 Other constipation: Secondary | ICD-10-CM | POA: Diagnosis not present

## 2023-08-24 DIAGNOSIS — Z720 Tobacco use: Secondary | ICD-10-CM | POA: Diagnosis not present

## 2023-08-24 DIAGNOSIS — K8021 Calculus of gallbladder without cholecystitis with obstruction: Secondary | ICD-10-CM | POA: Diagnosis not present

## 2023-08-28 ENCOUNTER — Ambulatory Visit: Payer: Self-pay | Admitting: Surgery

## 2023-08-28 DIAGNOSIS — K8020 Calculus of gallbladder without cholecystitis without obstruction: Secondary | ICD-10-CM | POA: Diagnosis not present

## 2023-08-29 ENCOUNTER — Telehealth (HOSPITAL_BASED_OUTPATIENT_CLINIC_OR_DEPARTMENT_OTHER): Payer: Self-pay | Admitting: *Deleted

## 2023-08-29 NOTE — Telephone Encounter (Signed)
 Called patient to schedule in person appt for preop clearance patient hasn't been seen for over more than a year needs to f/u with MD patient sees Dr Zoe Hinds unfortunately has no openings soon and only thing available is [place] patient would like to see someone in the [place] location or [facility] location is willing to switch provider if scheduling team can please help schedule patient for a preop clearance and help patient find a new doctor in either one of the patient's preferred locations patient has been made aware scheduling will reach out

## 2023-08-29 NOTE — Telephone Encounter (Signed)
   Name: [name]  DOB: [birth year]  MRN: [id number]  Primary Cardiologist: None  Chart reviewed as part of pre-operative protocol coverage. Because of NYJAI GRAFF past medical history and time since last visit, she will require a follow-up in-office visit in order to better assess preoperative cardiovascular risk. Pt has not been seen in office since 2023.   Pre-op covering staff: - Please schedule appointment and call patient to inform them. If patient already had an upcoming appointment within acceptable timeframe, please add "pre-op clearance" to the appointment notes so provider is aware. - Please contact requesting surgeon's office via preferred method (i.e, phone, fax) to inform them of need for appointment prior to surgery.  Patient is on Xarelto for history of PE/DVT.  Cardiology does not manage patient Xarelto.  Therefore, recommendations holding Xarelto prior to surgery should come from managing provider.  Jude Norton, NP  08/29/2023, 11:40 AM

## 2023-08-29 NOTE — Telephone Encounter (Signed)
   Pre-operative Risk Assessment    Patient Name: [name]  DOB: Feb 03, 1956 MRN: [id number]   Date of last office visit: 09/14/2022 Date of next office visit: None  Request for Surgical Clearance    Procedure:   Gallbladder surgery  Date of Surgery:  Clearance TBD                                 Surgeon:  Dr. Sim Dryer Surgeon's Group or Practice Name:  [facility]  Surgery Phone number:  [phone] Fax number:  [phone]   Type of Clearance Requested:   - Medical  - Pharmacy:  Hold Rivaroxaban (Xarelto) Not indicated.   Type of Anesthesia:  General    Additional requests/questions:    Signed, Lauris Port   08/29/2023, 10:20 AM

## 2023-09-03 NOTE — Progress Notes (Signed)
 Cardiology Office Note:  .   Date:  09/15/2023  ID:  Mary, [name] 1955-09-04, MRN [id number] PCP: Tretha Fu, MD  [facility] Providers Cardiologist:  Dr. Zoe Hinds   History of Present Illness: .   [name] is a 68 y.o. 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 Patient is followed by Dr. Sandee Crook and presents for a preoperative evaluation.   Patient previously had a DVT in 2006 and a PE in 2011. Has been on Xarelto for anticoagulation. She has a history of hypercoagulability per hematologic screening. She underwent coronary CTA in 05/2021 that showed a coronary calcium score of 386 (94th percentile). There was extensive plaque noted, but it did not appears obstructive. Echocardiogram in 03/2022 showed EF 60-65%, no regional wall motion abnormalities, normal RV systolic function, normal pulmonary artery systolic pressure. ETT in 03/2022 was negative for ischemia, normal HR and BP response to exercise.   Patient now pending gallbladder surgery.   Patient presents today for preoperative evaluation. Reports that she has overall been doing well from a cardiovascular standpoint. She denies chest pain, shortness of breath, orthopnea. 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 She performs activities like grocery shopping and climbing stairs without chest pain. She denies dizziness, syncope, near syncope. She is on xarelto for her history of PE, denies any bleeding. This is followed by her PCP. She denies palpitations. Has been having some lower extremity swelling. This is not new and has been an ongoing issue. Notes that elevating her feet and wearing compression socks does help improve her swelling. She is also lasix 40 mg daily    Studies Reviewed: .   Cardiac Studies & Procedures    ______________________________________________________________________________________________   STRESS TESTS  EXERCISE TOLERANCE TEST (ETT) 03/22/2022  Narrative   Negative EKG stress test for ischemia.   Poor exercise capacity (3:56 min:s; 4.7 METS).   Normal HR/BP response to exercise.   ECHOCARDIOGRAM  ECHOCARDIOGRAM COMPLETE 03/22/2022  Narrative ECHOCARDIOGRAM REPORT    Patient Name:   [name] Date of Exam: 03/22/2022 Medical Rec #:  [id number]         Height:       64.0 in Accession #:    [id number]        Weight:       228.6 lb Date of Birth:  04-Sep-1955         BSA:          2.071 m Patient Age:    66 years          BP:           112/64 mmHg Patient Gender: F                 HR:           70 bpm. Exam Location:  [address]  Procedure: 3D Echo, 2D Echo, Cardiac Doppler, Color Doppler and Strain Analysis  Indications:    R06.00 Dyspnea  History:        Patient has no prior history of Echocardiogram examinations. CAD, Abnormal ECG, COPD, Signs/Symptoms:Fatigue, Dizziness/Lightheadedness and Dyspnea; Risk Factors:Family History of Coronary Artery Disease, Hypertension, Dyslipidemia, Diabetes, Current Smoker and Sleep Apnea. History of Substance Abuse (Cocaine), DVT(2006), Pulmonary Embolism (2011).  Sonographer:    Ewing Holiday RDCS Referring Phys: Cathaleen Clinton DICK  IMPRESSIONS  1. Left ventricular ejection fraction, by estimation, is 60 to 65%. The left ventricle has normal function. The left ventricle has no regional wall motion abnormalities. Left ventricular diastolic parameters are indeterminate. 2. Right ventricular systolic function is normal. The right ventricular size is normal. There is normal pulmonary artery systolic pressure. 3. Left atrial size was mildly dilated. 4. No evidence of mitral valve regurgitation. 5. The aortic valve is tricuspid. Aortic valve regurgitation is not visualized.  Comparison(s): No prior  Echocardiogram.  FINDINGS Left Ventricle: Left ventricular ejection fraction, by estimation, is 60 to 65%. The left ventricle has normal function. The left ventricle has no regional wall motion abnormalities. The left ventricular internal cavity size was normal in size. There is no left ventricular hypertrophy. Left ventricular diastolic parameters are indeterminate.  Right Ventricle: The right ventricular size is normal. Right ventricular systolic function is normal. There is normal pulmonary artery systolic pressure. The tricuspid regurgitant velocity is 2.69 m/s, and with an assumed right atrial pressure of 3 mmHg, the estimated right ventricular systolic pressure is 31.9 mmHg.  Left Atrium: Left atrial size was mildly dilated.  Right Atrium: Right atrial size was normal in size.  Pericardium: There is no evidence of pericardial effusion.  Mitral Valve: No evidence of mitral valve regurgitation.  Tricuspid Valve: Tricuspid valve regurgitation is not demonstrated.  Aortic Valve: The aortic valve is tricuspid. Aortic valve regurgitation is not visualized.  Pulmonic Valve: Pulmonic valve regurgitation is not visualized.  Aorta: The aortic root and ascending aorta are structurally normal, with no evidence of dilitation.  IAS/Shunts: No atrial level shunt detected by color flow Doppler.   LEFT VENTRICLE PLAX 2D LVIDd:         3.80 cm   Diastology LVIDs:         1.60 cm   LV e' medial:    9.14 cm/s LV PW:         0.70 cm   LV E/e' medial:  5.8 LV IVS:        1.00 cm   LV e' lateral:   11.90 cm/s LVOT diam:     2.20 cm   LV E/e' lateral: 4.4 LV SV:         81 LV SV Index:   39        2D Longitudinal Strain LVOT Area:     3.80 cm  2D Strain GLS (A2C):   -21.9 % 2D Strain GLS (A3C):   -22.7 % 2D Strain GLS (A4C):   -22.9 % 2D Strain GLS Avg:     -22.5 %  3D Volume EF: 3D EF:        70 % LV EDV:       148 ml LV ESV:       44 ml LV SV:        104 ml  RIGHT VENTRICLE RV Basal  diam:  3.00 cm RV S prime:     17.70 cm/s TAPSE (M-mode): 2.2 cm  LEFT ATRIUM             Index        RIGHT ATRIUM           Index LA diam:        4.20 cm 2.03 cm/m   RA Area:     16.50 cm LA Vol (A2C):   73.6 ml 35.54 ml/m  RA Volume:   44.20 ml  21.35 ml/m LA Vol (A4C):   75.3 ml 36.36 ml/m LA  Biplane Vol: 74.6 ml 36.03 ml/m AORTIC VALVE LVOT Vmax:   106.00 cm/s LVOT Vmean:  69.150 cm/s LVOT VTI:    0.212 m  AORTA Ao Root diam: 3.10 cm Ao Asc diam:  3.40 cm  MITRAL VALVE               TRICUSPID VALVE MV Area (PHT): cm         TR Peak grad:   28.9 mmHg MV Decel Time: 244 msec    TR Vmax:        269.00 cm/s MV E velocity: 52.95 cm/s MV A velocity: 57.65 cm/s  SHUNTS MV E/A ratio:  0.92        Systemic VTI:  0.21 m Systemic Diam: 2.20 cm  Alois Arnt Electronically signed by Alois Arnt Signature Date/Time: 03/22/2022/12:44:33 PM    Final      CT SCANS  CT CORONARY MORPH W/CTA COR W/SCORE 05/13/2021  Addendum 05/13/2021  8:28 PM ADDENDUM REPORT: 05/13/2021 20:25  CLINICAL DATA:  Chest pain  EXAM: Cardiac CTA  MEDICATIONS: Sub lingual nitro. 4mg  x 2  TECHNIQUE: The patient was scanned on a Siemens 192 slice scanner. Gantry rotation speed was 250 msecs. Collimation was 0.6 mm. A 100 kV prospective scan was triggered in the ascending thoracic aorta at 35-75% of the R-R interval. Average HR during the scan was 60 bpm. The 3D data set was interpreted on a dedicated work station using MPR, MIP and VRT modes. A total of 80cc of contrast was used.  FINDINGS: Non-cardiac: See separate report from [facility].  Pulmonary veins drain normally to the left atrium.  Calcium Score: 386 Agatston units.  Coronary Arteries: Right dominant with no anomalies  LM: No plaque or stenosis.  LAD system: Mixed plaque proximal LAD, mild (1-24%) stenosis.  Circumflex system: Mixed plaque proximal LCx, mild (1-24%) stenosis. Mixed plaque proximal OM1, mild  (25-49%) stenosis. Mixed plaque proximal OM2, mild (25-49%) stenosis. Mixed plaque distal LCx, mild (25-49%) stenosis.  RCA system: Calcified plaque proximal RCA with mild (1-24%) stenosis. Mixed plaque mid RCA with mild (25-49%) stenosis. Calcified plaque distal RCA, probably mild (25-49%) stenosis.  IMPRESSION: 1. Coronary artery calcium score 386 Agatston units places the patient in the 94th percentile for age and gender, suggesting high risk for future cardiac events.  2. There was extensive plaque noted, but does not appear obstructive.  Dalton Mclean   Electronically Signed By: Peder Bourdon M.D. On: 05/13/2021 20:25  Narrative EXAM: OVER-READ INTERPRETATION  CT CHEST  The following report is an over-read performed by radiologist Dr. Erica Hau of [facility] on 05/13/2021. This over-read does not include interpretation of cardiac or coronary anatomy or pathology. The coronary CTA interpretation by the cardiologist is attached.  COMPARISON:  Screening chest CT on 01/09/2020  FINDINGS: Vascular: No significant noncardiac vascular findings.  Mediastinum/Nodes: Visualized mediastinum and hilar regions demonstrate no lymphadenopathy or masses.  Lungs/Pleura: Visualized lungs show no evidence of pulmonary edema, consolidation, pneumothorax, nodule or pleural fluid.  Upper Abdomen: No acute abnormality.  Musculoskeletal: No chest wall mass or suspicious bone lesions identified.  IMPRESSION: No significant incidental findings.  Electronically Signed: By: Erica Hau M.D. On: 05/13/2021 14:40     ______________________________________________________________________________________________      Risk Assessment/Calculations:             Physical Exam:   VS:  BP 132/89 (BP Location: Right Arm, Patient Position: Sitting, Cuff Size: Large)   Pulse 90   Ht 5'  4" (1.626 m)   Wt 218 lb (98.9 kg)   SpO2 97%   BMI 37.42 kg/m    Wt  Readings from Last 3 Encounters:  09/15/23 218 lb (98.9 kg)  05/30/23 224 lb 14.4 oz (102 kg)  02/27/23 231 lb 6.4 oz (105 kg)    GEN: Well nourished, well developed in no acute distress. Sitting comfortably in the exam chair  NECK: No JVD; No carotid bruits CARDIAC: RRR, no murmurs, rubs, gallops RESPIRATORY:  Clear to auscultation without rales, wheezing or rhonchi  ABDOMEN: Soft, non-tender, non-distended EXTREMITIES:  Trace edema in BLE; No deformity   ASSESSMENT AND PLAN: .    Preoperative Evaluation  - Patient is currently pending gallbladder surgery.  Denies chest pain, shortness of breath, palpitations, syncope/near syncope.  Has persistent lower extremity swelling which has been a chronic issue. 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  Walks outdoors regularly.  Able to go grocery shopping, climb up and down stairs without chest pain or shortness of breath. -Most recent echocardiogram from 03/2022 showed EF 60-65%, no wall motion abnormalities, normal RV systolic function, no significant valvular abnormalities.  Most recent ischemic evaluation was an exercise tolerance test in 03/2022 that was negative for ischemia - As patient is able to complete greater than 4 METS physical activity, no indication for further cardiac testing prior to surgery - Will send this note to requesting surgeon to serve as pre-op eval  - Note, patient is on xarelto for history of PE. This is managed by her PCP. Recommendations on holding xarelto should come from managing provider. Patient voiced understanding and will reach out to PCP   Nonobstructive CAD  - Previously had coronary CTA in 05/2021 that showed coronary calcium score of 386 (94th percentile), extensive plaque but it was no obstructive. ETT in 03/2022 was negative for ischemia  - Patient denies chest pain, DOE.  - Not on ASA due to xarelto use  - Continue crestor 20 mg daily  - Continue amlodipine 10 mg daily. Was  previously on imdur  and metoprolol  as well but had dizziness/near syncope on all three medications. Has been off imdur  and metoprolol  for a long time, but they are still on med list. Removed from med list after this visit   HTN  - BP well controlled. She denies recent episodes of dizziness, lightheadedness, syncope, near syncope  - Continue amlodipine 10 mg daily, losartan 100 mg daily, hydralazine 25 mg daily, lasix 40 mg daily  - As above, removed imdur  and metoprolol  from med list (she has not been taking for a long time) - K 4.6 and creatinine 0.92 in 11/2022   HLD  - Labs followed by PCP  - Continue crestor 20 mg daily   History of PE  - On chronic anticoagulation with xarelto 20 mg daily. Denies bleeding   Lower extremity swelling - This has been an ongoing issue - Echo from 03/2022 showed EF 60-65%, normal RV function, no valvular abnormalities - Discussed low sodium diets, compression stockings, elevating feet whenever possible  - Continue lasix 40 mg daily. Advised patient that she could take an additional 20 mg daily PRN for worsening swelling   Tobacco use  - Admits to smoking 1/2 pack per day  - Counseled on tobacco cessation   COPD - Followed by pulmonology  Dispo: Follow up in 6 months   Signed, Debria Fang, PA-C

## 2023-09-11 DIAGNOSIS — M4800 Spinal stenosis, site unspecified: Secondary | ICD-10-CM | POA: Diagnosis not present

## 2023-09-11 DIAGNOSIS — M5432 Sciatica, left side: Secondary | ICD-10-CM | POA: Diagnosis not present

## 2023-09-11 DIAGNOSIS — M5416 Radiculopathy, lumbar region: Secondary | ICD-10-CM | POA: Diagnosis not present

## 2023-09-11 DIAGNOSIS — N1832 Chronic kidney disease, stage 3b: Secondary | ICD-10-CM | POA: Diagnosis not present

## 2023-09-11 DIAGNOSIS — M545 Low back pain, unspecified: Secondary | ICD-10-CM | POA: Diagnosis not present

## 2023-09-11 DIAGNOSIS — M25551 Pain in right hip: Secondary | ICD-10-CM | POA: Diagnosis not present

## 2023-09-11 DIAGNOSIS — G8929 Other chronic pain: Secondary | ICD-10-CM | POA: Diagnosis not present

## 2023-09-11 DIAGNOSIS — G894 Chronic pain syndrome: Secondary | ICD-10-CM | POA: Diagnosis not present

## 2023-09-11 DIAGNOSIS — M25552 Pain in left hip: Secondary | ICD-10-CM | POA: Diagnosis not present

## 2023-09-11 DIAGNOSIS — M25521 Pain in right elbow: Secondary | ICD-10-CM | POA: Diagnosis not present

## 2023-09-11 DIAGNOSIS — Z79899 Other long term (current) drug therapy: Secondary | ICD-10-CM | POA: Diagnosis not present

## 2023-09-15 ENCOUNTER — Ambulatory Visit: Attending: Cardiology | Admitting: Cardiology

## 2023-09-15 ENCOUNTER — Encounter: Payer: Self-pay | Admitting: Cardiology

## 2023-09-15 VITALS — BP 132/89 | HR 90 | Ht 64.0 in | Wt 218.0 lb

## 2023-09-15 DIAGNOSIS — Z86711 Personal history of pulmonary embolism: Secondary | ICD-10-CM

## 2023-09-15 DIAGNOSIS — J4489 Other specified chronic obstructive pulmonary disease: Secondary | ICD-10-CM

## 2023-09-15 DIAGNOSIS — Z01810 Encounter for preprocedural cardiovascular examination: Secondary | ICD-10-CM | POA: Diagnosis not present

## 2023-09-15 DIAGNOSIS — I10 Essential (primary) hypertension: Secondary | ICD-10-CM | POA: Diagnosis not present

## 2023-09-15 DIAGNOSIS — J439 Emphysema, unspecified: Secondary | ICD-10-CM | POA: Diagnosis not present

## 2023-09-15 DIAGNOSIS — I2510 Atherosclerotic heart disease of native coronary artery without angina pectoris: Secondary | ICD-10-CM | POA: Diagnosis not present

## 2023-09-15 DIAGNOSIS — Z720 Tobacco use: Secondary | ICD-10-CM | POA: Diagnosis not present

## 2023-09-15 DIAGNOSIS — E785 Hyperlipidemia, unspecified: Secondary | ICD-10-CM

## 2023-09-15 NOTE — Patient Instructions (Signed)
 Medication Instructions:  No changes *If you need a refill on your cardiac medications before your next appointment, please call your pharmacy*  Lab Work: No labs  Testing/Procedures: No testing  Follow-Up: At [facility], you and your health needs are our priority.  As part of our continuing mission to provide you with exceptional heart care, our providers are all part of one team.  This team includes your primary Cardiologist (physician) and Advanced Practice Providers or APPs (Physician Assistants and Nurse Practitioners) who all work together to provide you with the care you need, when you need it.  Your next appointment:   6 month(s)  Provider:   Any APP  We recommend signing up for the patient portal called "MyChart".  Sign up information is provided on this After Visit Summary.  MyChart is used to connect with patients for Virtual Visits (Telemedicine).  Patients are able to view lab/test results, encounter notes, upcoming appointments, etc.  Non-urgent messages can be sent to your provider as well.   To learn more about what you can do with MyChart, go to [email or web address].

## 2023-09-20 DIAGNOSIS — N1831 Chronic kidney disease, stage 3a: Secondary | ICD-10-CM | POA: Diagnosis not present

## 2023-09-20 DIAGNOSIS — E1122 Type 2 diabetes mellitus with diabetic chronic kidney disease: Secondary | ICD-10-CM | POA: Diagnosis not present

## 2023-09-20 DIAGNOSIS — N200 Calculus of kidney: Secondary | ICD-10-CM | POA: Diagnosis not present

## 2023-09-20 DIAGNOSIS — G8929 Other chronic pain: Secondary | ICD-10-CM | POA: Diagnosis not present

## 2023-09-20 DIAGNOSIS — I129 Hypertensive chronic kidney disease with stage 1 through stage 4 chronic kidney disease, or unspecified chronic kidney disease: Secondary | ICD-10-CM | POA: Diagnosis not present

## 2023-09-20 DIAGNOSIS — Z720 Tobacco use: Secondary | ICD-10-CM | POA: Diagnosis not present

## 2023-09-21 ENCOUNTER — Other Ambulatory Visit: Payer: Self-pay | Admitting: Internal Medicine

## 2023-09-21 DIAGNOSIS — N1831 Chronic kidney disease, stage 3a: Secondary | ICD-10-CM

## 2023-09-21 IMAGING — CT CT ABD-PELV W/ CM
2 of 5 series · 17 of 46 positions shown, 19 images · IV contrast (Omnipaque)
Comparison: 08/17/2020

CLINICAL DATA: Right lower quadrant pain for several days. Nausea
and vomiting. Diarrhea.

EXAM:
CT ABDOMEN AND PELVIS WITH CONTRAST
TECHNIQUE: Multidetector CT imaging of the abdomen and pelvis was performed
using the standard protocol following bolus administration of
intravenous contrast.

[Series 2: axial st · axial · 0.98mm/px · z∈[-422,-36]mm · 14 of 87 slices shown, 16 images]
[im 5/87  soft-tissue]
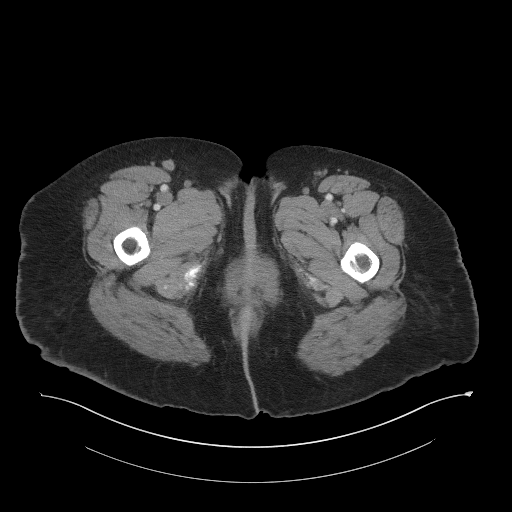
[im 5/87  bone]
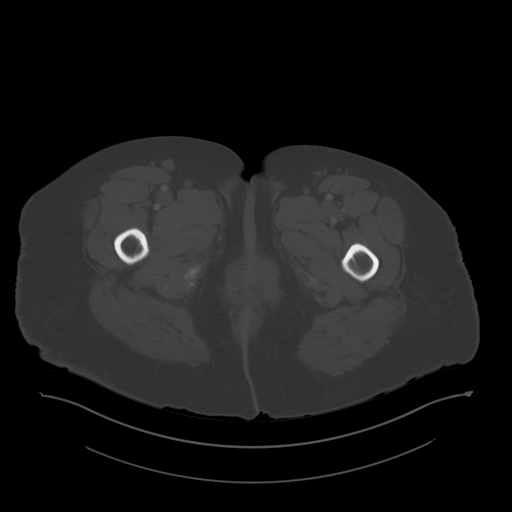
[im 10/87  soft-tissue]
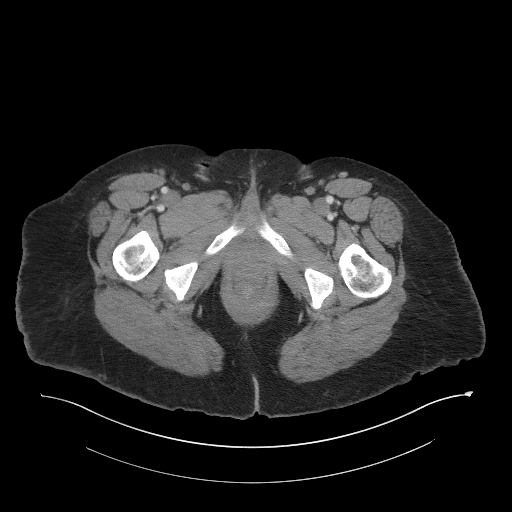
[im 20/87  soft-tissue]
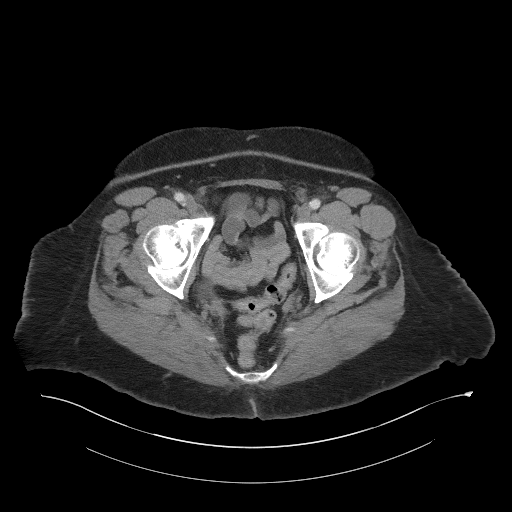
[im 24/87  soft-tissue]
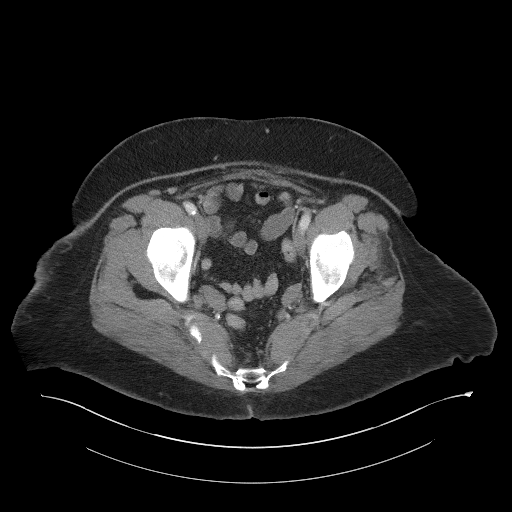
[im 29/87  soft-tissue]
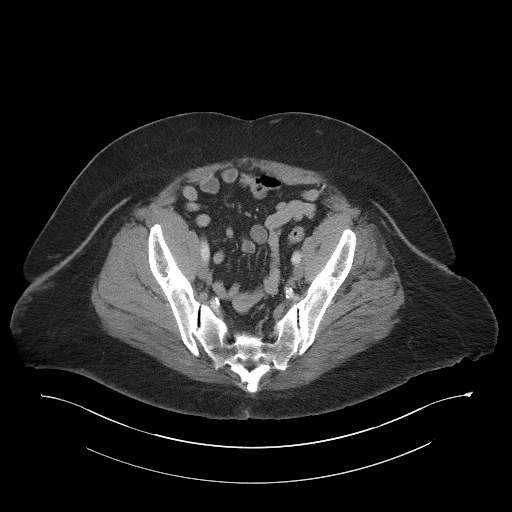
[im 34/87  soft-tissue]
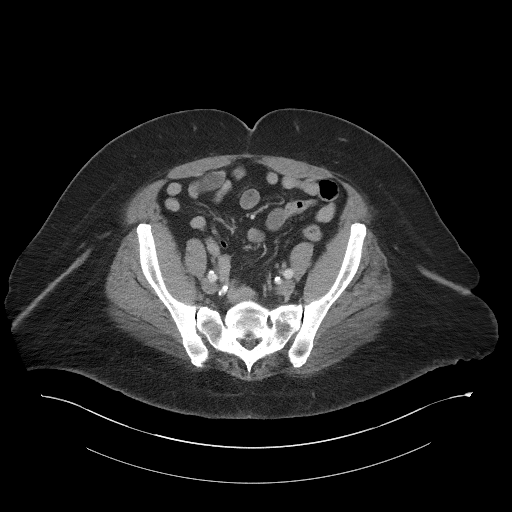
[im 39/87  soft-tissue]
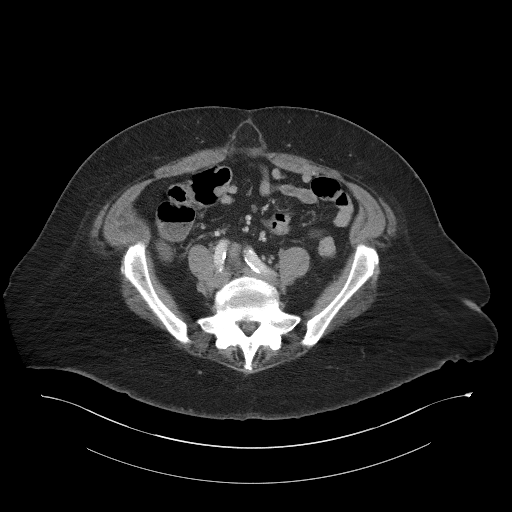
[im 48/87  soft-tissue]
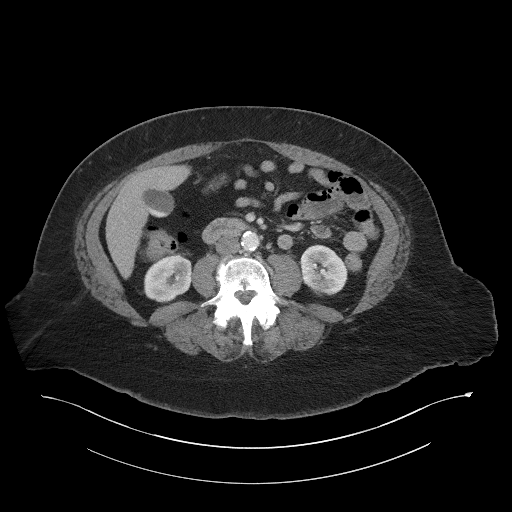
[im 53/87  soft-tissue]
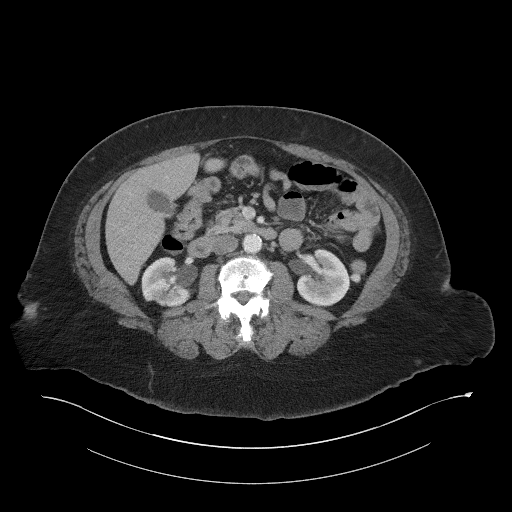
[im 53/87  bone]
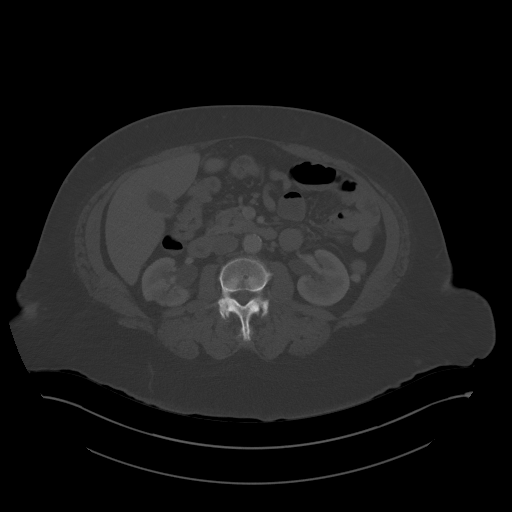
[im 58/87  soft-tissue]
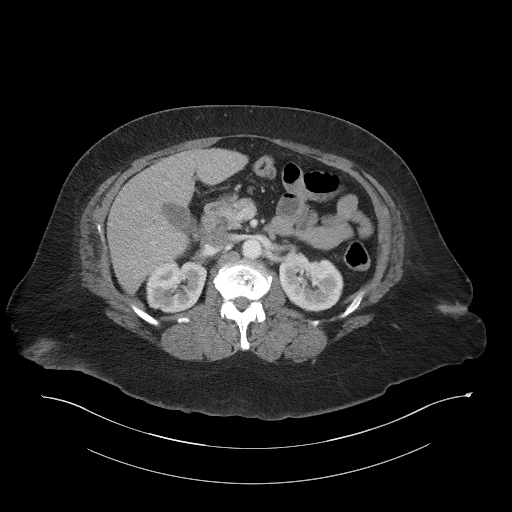
[im 63/87  soft-tissue]
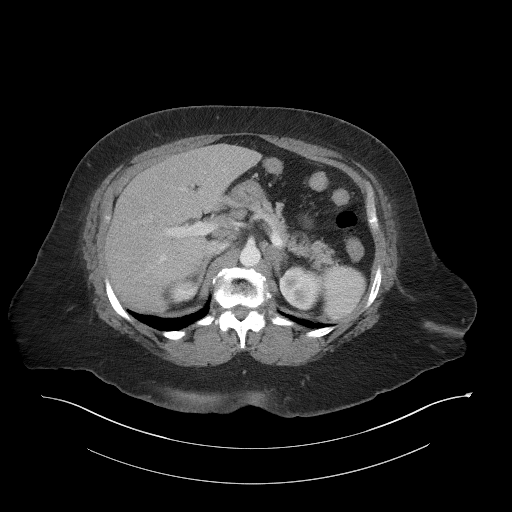
[im 67/87  soft-tissue]
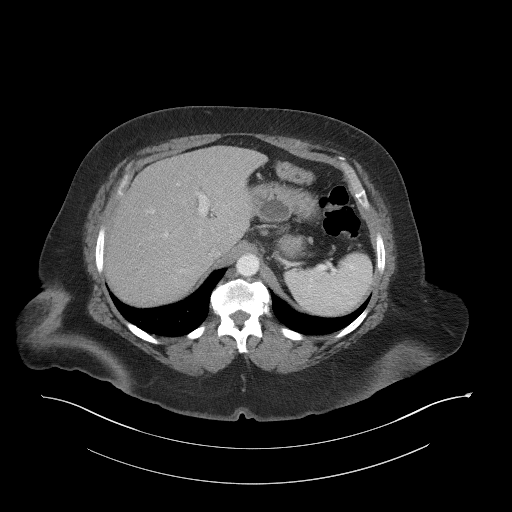
[im 77/87  soft-tissue]
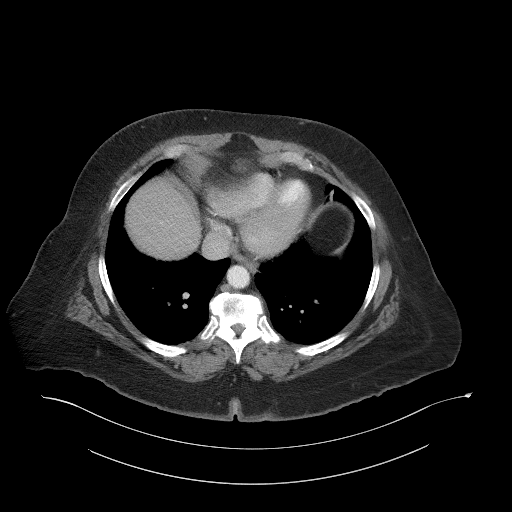
[im 82/87  soft-tissue]
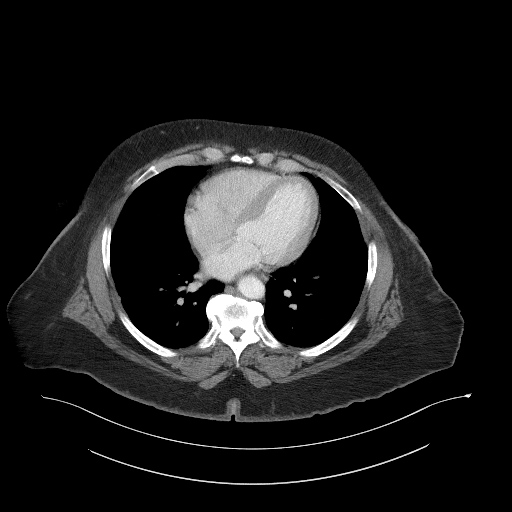

[Series 5: coronal st · coronal · 0.90mm/px · 3 of 105 slices shown]
[im 35/105  soft-tissue]
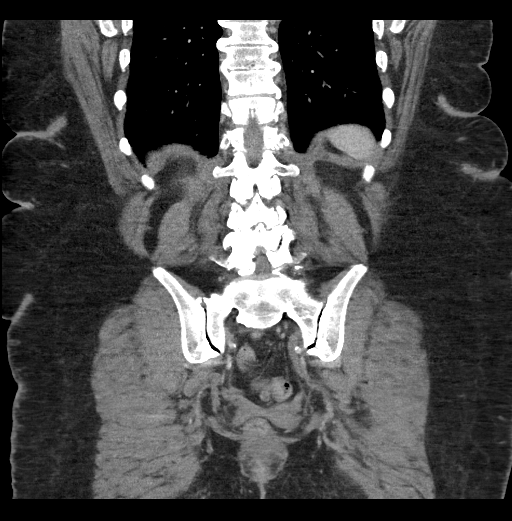
[im 47/105  soft-tissue]
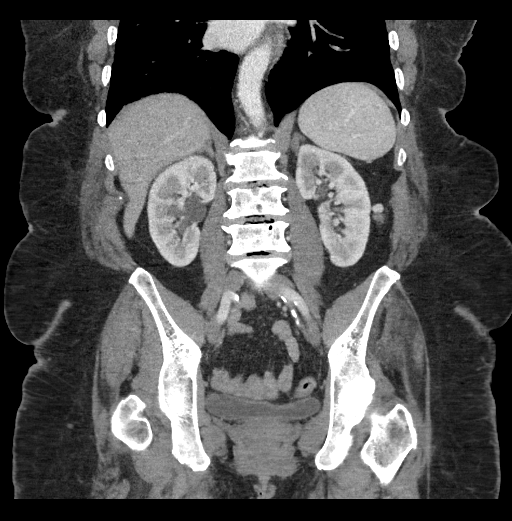
[im 58/105  soft-tissue]
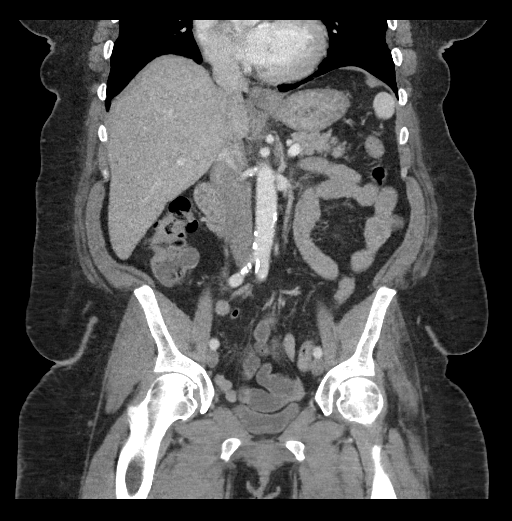

[17 of 46 positions shown; findings below may reference images not displayed]

RADIATION DOSE REDUCTION: This exam was performed according to the
departmental dose-optimization program which includes automated
exposure control, adjustment of the mA and/or kV according to
patient size and/or use of iterative reconstruction technique.

CONTRAST:  100mL OMNIPAQUE IOHEXOL 300 MG/ML  SOLN
FINDINGS: Lower Chest: No acute findings.

Hepatobiliary: No hepatic masses identified. Multiple tiny
gallstones are seen along the dependent gallbladder wall, however
there is no evidence of cholecystitis or biliary ductal dilatation.

Pancreas:  No mass or inflammatory changes.

Spleen: Within normal limits in size and appearance.

Adrenals/Urinary Tract: No masses identified. Mild scarring again
seen involving both kidneys. No evidence of ureteral calculi or
hydronephrosis.

Stomach/Bowel: No evidence of obstruction, inflammatory process or
abnormal fluid collections. Normal appendix visualized.

Vascular/Lymphatic: No pathologically enlarged lymph nodes. No acute
vascular findings. Aortic atherosclerotic calcification noted.

Reproductive: Prior hysterectomy noted. Adnexal regions are
unremarkable in appearance.

Other:  Stable small umbilical hernia, which contains only fat.

Musculoskeletal: No suspicious bone lesions identified. Stable
lumbar spine degenerative changes, with grade 1-2 anterolisthesis at
L3-4 measuring approximately 8 mm.
IMPRESSION: No evidence of appendicitis or other acute findings.

Cholelithiasis. No radiographic evidence of cholecystitis.

Aortic Atherosclerosis ([id number]).

## 2023-09-25 ENCOUNTER — Ambulatory Visit
Admission: RE | Admit: 2023-09-25 | Discharge: 2023-09-25 | Disposition: A | Discharge status: Home / Self Care | Source: Ambulatory Visit | Attending: Internal Medicine | Admitting: Internal Medicine

## 2023-09-25 DIAGNOSIS — N1831 Chronic kidney disease, stage 3a: Secondary | ICD-10-CM | POA: Diagnosis not present

## 2023-09-28 DIAGNOSIS — K5909 Other constipation: Secondary | ICD-10-CM | POA: Diagnosis not present

## 2023-09-28 DIAGNOSIS — K8020 Calculus of gallbladder without cholecystitis without obstruction: Secondary | ICD-10-CM | POA: Diagnosis not present

## 2023-09-28 DIAGNOSIS — K2270 Barrett's esophagus without dysplasia: Secondary | ICD-10-CM | POA: Diagnosis not present

## 2023-10-06 DIAGNOSIS — M1990 Unspecified osteoarthritis, unspecified site: Secondary | ICD-10-CM | POA: Diagnosis not present

## 2023-10-06 DIAGNOSIS — I10 Essential (primary) hypertension: Secondary | ICD-10-CM | POA: Diagnosis not present

## 2023-10-06 DIAGNOSIS — K5909 Other constipation: Secondary | ICD-10-CM | POA: Diagnosis not present

## 2023-10-06 DIAGNOSIS — K8021 Calculus of gallbladder without cholecystitis with obstruction: Secondary | ICD-10-CM | POA: Diagnosis not present

## 2023-10-06 DIAGNOSIS — G629 Polyneuropathy, unspecified: Secondary | ICD-10-CM | POA: Diagnosis not present

## 2023-10-06 DIAGNOSIS — K219 Gastro-esophageal reflux disease without esophagitis: Secondary | ICD-10-CM | POA: Diagnosis not present

## 2023-10-06 DIAGNOSIS — E785 Hyperlipidemia, unspecified: Secondary | ICD-10-CM | POA: Diagnosis not present

## 2023-10-06 DIAGNOSIS — E1140 Type 2 diabetes mellitus with diabetic neuropathy, unspecified: Secondary | ICD-10-CM | POA: Diagnosis not present

## 2023-10-06 DIAGNOSIS — J449 Chronic obstructive pulmonary disease, unspecified: Secondary | ICD-10-CM | POA: Diagnosis not present

## 2023-10-06 DIAGNOSIS — Z7901 Long term (current) use of anticoagulants: Secondary | ICD-10-CM | POA: Diagnosis not present

## 2023-10-06 DIAGNOSIS — Z720 Tobacco use: Secondary | ICD-10-CM | POA: Diagnosis not present

## 2023-10-18 DIAGNOSIS — I2699 Other pulmonary embolism without acute cor pulmonale: Secondary | ICD-10-CM | POA: Diagnosis not present

## 2023-10-19 DIAGNOSIS — G894 Chronic pain syndrome: Secondary | ICD-10-CM | POA: Diagnosis not present

## 2023-10-19 DIAGNOSIS — G8929 Other chronic pain: Secondary | ICD-10-CM | POA: Diagnosis not present

## 2023-10-19 DIAGNOSIS — M545 Low back pain, unspecified: Secondary | ICD-10-CM | POA: Diagnosis not present

## 2023-10-19 DIAGNOSIS — M25521 Pain in right elbow: Secondary | ICD-10-CM | POA: Diagnosis not present

## 2023-10-19 DIAGNOSIS — M5432 Sciatica, left side: Secondary | ICD-10-CM | POA: Diagnosis not present

## 2023-10-19 DIAGNOSIS — Z79899 Other long term (current) drug therapy: Secondary | ICD-10-CM | POA: Diagnosis not present

## 2023-10-19 DIAGNOSIS — N1832 Chronic kidney disease, stage 3b: Secondary | ICD-10-CM | POA: Diagnosis not present

## 2023-10-24 DIAGNOSIS — R197 Diarrhea, unspecified: Secondary | ICD-10-CM | POA: Diagnosis not present

## 2023-10-24 DIAGNOSIS — K5909 Other constipation: Secondary | ICD-10-CM | POA: Diagnosis not present

## 2023-10-24 DIAGNOSIS — R1013 Epigastric pain: Secondary | ICD-10-CM | POA: Diagnosis not present

## 2023-11-01 DIAGNOSIS — R197 Diarrhea, unspecified: Secondary | ICD-10-CM | POA: Diagnosis not present

## 2023-11-06 DIAGNOSIS — Z86711 Personal history of pulmonary embolism: Secondary | ICD-10-CM | POA: Diagnosis not present

## 2023-11-06 DIAGNOSIS — Z86718 Personal history of other venous thrombosis and embolism: Secondary | ICD-10-CM | POA: Diagnosis not present

## 2023-11-06 DIAGNOSIS — E1122 Type 2 diabetes mellitus with diabetic chronic kidney disease: Secondary | ICD-10-CM | POA: Diagnosis not present

## 2023-11-06 DIAGNOSIS — J4489 Other specified chronic obstructive pulmonary disease: Secondary | ICD-10-CM | POA: Diagnosis not present

## 2023-11-06 DIAGNOSIS — N1831 Chronic kidney disease, stage 3a: Secondary | ICD-10-CM | POA: Diagnosis not present

## 2023-11-06 DIAGNOSIS — Z8249 Family history of ischemic heart disease and other diseases of the circulatory system: Secondary | ICD-10-CM | POA: Diagnosis not present

## 2023-11-07 ENCOUNTER — Telehealth: Payer: Self-pay

## 2023-11-07 NOTE — Telephone Encounter (Signed)
 Pt verbally confirmed appt for 7/2

## 2023-11-08 ENCOUNTER — Inpatient Hospital Stay

## 2023-11-08 ENCOUNTER — Inpatient Hospital Stay: Attending: Hematology and Oncology | Admitting: Hematology and Oncology

## 2023-11-08 VITALS — BP 143/68 | HR 82 | Temp 99.2°F | Resp 18 | Ht 64.0 in | Wt 215.3 lb

## 2023-11-08 DIAGNOSIS — F17210 Nicotine dependence, cigarettes, uncomplicated: Secondary | ICD-10-CM | POA: Diagnosis not present

## 2023-11-08 DIAGNOSIS — Z825 Family history of asthma and other chronic lower respiratory diseases: Secondary | ICD-10-CM | POA: Insufficient documentation

## 2023-11-08 DIAGNOSIS — I2699 Other pulmonary embolism without acute cor pulmonale: Secondary | ICD-10-CM | POA: Insufficient documentation

## 2023-11-08 DIAGNOSIS — Z7901 Long term (current) use of anticoagulants: Secondary | ICD-10-CM | POA: Diagnosis not present

## 2023-11-08 DIAGNOSIS — E119 Type 2 diabetes mellitus without complications: Secondary | ICD-10-CM | POA: Insufficient documentation

## 2023-11-08 DIAGNOSIS — Z8041 Family history of malignant neoplasm of ovary: Secondary | ICD-10-CM | POA: Diagnosis not present

## 2023-11-08 DIAGNOSIS — Z8042 Family history of malignant neoplasm of prostate: Secondary | ICD-10-CM | POA: Diagnosis not present

## 2023-11-08 DIAGNOSIS — Z833 Family history of diabetes mellitus: Secondary | ICD-10-CM | POA: Diagnosis not present

## 2023-11-08 DIAGNOSIS — K8681 Exocrine pancreatic insufficiency: Secondary | ICD-10-CM | POA: Diagnosis not present

## 2023-11-08 DIAGNOSIS — Z8249 Family history of ischemic heart disease and other diseases of the circulatory system: Secondary | ICD-10-CM | POA: Insufficient documentation

## 2023-11-08 DIAGNOSIS — Z885 Allergy status to narcotic agent status: Secondary | ICD-10-CM | POA: Diagnosis not present

## 2023-11-08 DIAGNOSIS — Z90710 Acquired absence of both cervix and uterus: Secondary | ICD-10-CM | POA: Insufficient documentation

## 2023-11-08 DIAGNOSIS — Z79899 Other long term (current) drug therapy: Secondary | ICD-10-CM | POA: Diagnosis not present

## 2023-11-08 DIAGNOSIS — J4489 Other specified chronic obstructive pulmonary disease: Secondary | ICD-10-CM | POA: Diagnosis not present

## 2023-11-08 DIAGNOSIS — K819 Cholecystitis, unspecified: Secondary | ICD-10-CM | POA: Insufficient documentation

## 2023-11-08 DIAGNOSIS — Z86711 Personal history of pulmonary embolism: Secondary | ICD-10-CM | POA: Diagnosis not present

## 2023-11-08 DIAGNOSIS — I10 Essential (primary) hypertension: Secondary | ICD-10-CM | POA: Insufficient documentation

## 2023-11-08 DIAGNOSIS — I2510 Atherosclerotic heart disease of native coronary artery without angina pectoris: Secondary | ICD-10-CM | POA: Diagnosis not present

## 2023-11-08 DIAGNOSIS — D0512 Intraductal carcinoma in situ of left breast: Secondary | ICD-10-CM | POA: Insufficient documentation

## 2023-11-08 DIAGNOSIS — Z8051 Family history of malignant neoplasm of kidney: Secondary | ICD-10-CM | POA: Diagnosis not present

## 2023-11-08 DIAGNOSIS — Z86718 Personal history of other venous thrombosis and embolism: Secondary | ICD-10-CM | POA: Insufficient documentation

## 2023-11-08 MED ORDER — ENOXAPARIN SODIUM 100 MG/ML IJ SOSY: 100.0000 mg | PREFILLED_SYRINGE | Freq: Two times a day (BID) | INTRAMUSCULAR | 0 refills | Status: DC

## 2023-11-08 NOTE — Progress Notes (Signed)
 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 Concerns about perioperative anticoagulation management. - Stop Xarelto 72 hours before surgery. - Bridge with Lovenox to minimize clotting risk during perioperative period. Prescription dispensed. - Order clotting workup to investigate recurrent clotting tendency. - She should not take any anticoagulation on the day of surgery. Now she understands there is a small risk of clotting while she is off of anticoagulation. - Anticoagulation should be resumed by the surgeon as soon as its deemed safe.  Cholecystitis Surgery indicated but delayed due to anticoagulation management concerns. - Send message to Dr. Ananias - Prescribe Lovenox for bridging anticoagulation therapy. - Do not take any anticoagulant on the day of surgery.  Breast Cancer Intolerance to hormonal therapy due to side effects. Mammogram scheduled for November 28, 2023. No recent bone density scan noted. - Schedule bone density scan on the same day as mammogram.  RTC as scheduled.   HISTORY OF PRESENTING ILLNESS:  [name] 68 y.o. female is here because of DCIS  Oncology History  Ductal carcinoma in situ (DCIS) of left breast  07/04/2022 Mammogram   Screening mammogram suggested further evaluation for calcifications in the left breast.  She then had a diagnostic mammogram which showed 4.8 cm span of  indeterminate calcifications in the left breast warranting tissue diagnosis.   08/23/2022 Pathology Results   Left breast needle core biopsy showed grade 3 DCIS, necrosis and calcifications present, prognostic showed ER 100% positive strong staining PR 60% positive moderate to strong staining   09/06/2022 Genetic Testing   Negative genetic testing on the Multi-Cancer+RNA gene panel.  CHEK2 c.164C>T (p.Ser55Phe), CHEK2 c.542G>A (p.Arg181His), MSH3 c.2600T>C (p.Ile867Thr), and RET c.2611G>A (p.Val871Ile) VUS were identified.  The report date is September 06, 2022.  The Multi-Cancer + RNA Panel offered by [facility] includes sequencing and/or deletion/duplication analysis of the following 70 genes:  AIP*, ALK, APC*, ATM*, AXIN2*, BAP1*, BARD1*, BLM*, BMPR1A*, BRCA1*, BRCA2*, BRIP1*, CDC73*, CDH1*, CDK4, CDKN1B*, CDKN2A, CHEK2*, CTNNA1*, DICER1*, EPCAM (del/dup only), EGFR, FH*, FLCN*, GREM1 (promoter dup only), HOXB13, KIT, LZTR1, MAX*, MBD4, MEN1*, MET, MITF, MLH1*, MSH2*, MSH3*, MSH6*, MUTYH*, NF1*, NF2*, NTHL1*, PALB2*, PDGFRA, PMS2*, POLD1*, POLE*, POT1*, PRKAR1A*, PTCH1*, PTEN*, RAD51C*, RAD51D*, RB1*, RET, SDHA* (sequencing only), SDHAF2*, SDHB*, SDHC*, SDHD*, SMAD4*, SMARCA4*, SMARCB1*, SMARCE1*, STK11*, SUFU*, TMEM127*, TP53*, TSC1*, TSC2*, VHL*. RNA analysis is performed for * genes.    11/23/2022 Surgery   DCIS, 4cm, intermediate to high grade, with necrosis, ER/PR psoitive   11/23/2022 Cancer Staging   Staging form: Breast, AJCC 8th Edition - Pathologic stage from 11/23/2022: Stage 0 (pTis (DCIS), pN0, cM0, ER+, PR+) - Signed by Crawford Morna Pickle, NP on 05/29/2023 Stage prefix: Initial diagnosis   01/12/2023 - 02/10/2023 Radiation Therapy   Plan Name: Breast_L_BH Site: Breast, Left Technique: 3D Mode: Photon Dose Per Fraction: 2.66 Gy Prescribed Dose (Delivered / Prescribed): 42.56 Gy / 42.56 Gy Prescribed Fxs (Delivered / Prescribed): 16 / 16   Plan Name: Brst_L_Bst_BH Site: Breast,  Left Technique: 3D Mode: Photon Dose Per Fraction: 2  Gy Prescribed Dose (Delivered / Prescribed): 8 Gy / 8 Gy Prescribed Fxs (Delivered / Prescribed): 4 / 4   03/2023 -  Anti-estrogen oral therapy   Anastrozole     At baseline she has COPD, diabetes, 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  No history of breast cancer in the family however several other cancers run in the family.  Maternal aunt had ovarian cancer, sister had renal cancer, dad had metastatic carcinoma of unknown primary.  She has 2 children, 2 boys.  No history of hormone replacement therapy.  She complains of occasional back pain for which she takes prednisone , otherwise significant history of DVT for which she stayed on Coumadin and PE subsequently hence she was recommended indefinite anticoagulation.  There is also strong family history of blood clots.  Sister had saddle pulmonary embolism.    She had left breast lumpectomy which showed DCIS, cribriform and solid intermediate to high nuclear grade with necrosis, DCIS measured 4 cm, negative margins, ER/PR positive.  ER 100% positive strong staining PR 40% positive moderate to strong staining.    Discussed the use of AI scribe software for clinical note transcription with the patient, who gave verbal consent to proceed.  History of Present Illness [name] is a 68 year old female with a history of blood clots who presents for management of anticoagulation therapy in preparation for cholecystectomy.  She has been experiencing persistent diarrhea, occurring four to five times daily since February. She has been using medications such as Imodium and Lomotil to manage these symptoms, describing the sensation as 'everything I eat is not coming out.'  She has a significant family history of blood clots, with her mother and all five siblings affected. Her mother passed away following a  colonoscopy, which has increased her concerns about surgical risks.  She has been on anticoagulation therapy for many years, initially on Coumadin and currently on Xarelto. In 2009, she developed a pulmonary embolism after discontinuing Coumadin, leading to the decision to remain on anticoagulation therapy indefinitely.  She is experiencing more leg swelling than usual but she says this is not another blood clot, she thinks its because she hasn't been taking her lasix. No new shortness of breath or chest pain. She has a history of deep vein thrombosis (DVT) and pulmonary embolism.  Rest of the pertinent 10 point ROS reviewed and negative  MEDICAL HISTORY:  Past Medical History:  Diagnosis Date   Asthma    Breast cancer (HCC) 08/23/2022   Bronchitis, asthmatic    COPD (chronic obstructive pulmonary disease) (HCC)    Coronary artery calcification seen on CT scan    Diabetes (HCC)    DVT (deep venous thrombosis) (HCC)    Family history of kidney cancer    Family history of ovarian cancer    Family history of prostate cancer    Hyperlipidemia    Hypertension    Observed sleep apnea     SURGICAL HISTORY: Past Surgical History:  Procedure Laterality Date   ABDOMINAL HYSTERECTOMY     BREAST BIOPSY Left 08/23/2022   MM LT BREAST BX W LOC DEV 1ST LESION IMAGE BX SPEC STEREO GUIDE 08/23/2022 [facility]   BREAST BIOPSY Left 08/23/2022   MM LT BREAST BX W LOC DEV EA AD LESION IMG BX SPEC STEREO GUIDE 08/23/2022 [facility]   BREAST BIOPSY  11/22/2022   MM LT  RADIOACTIVE SEED EA ADD LESION LOC MAMMO GUIDE 11/22/2022 [facility]   BREAST BIOPSY  11/22/2022   MM LT RADIOACTIVE SEED LOC MAMMO GUIDE 11/22/2022 [facility]   BREAST LUMPECTOMY WITH RADIOACTIVE SEED LOCALIZATION Left 11/23/2022   Procedure: LEFT BREAST SEED BRACKETED CENTRAL LUMPECTOMY AND NIPPLE RESECTION;  Surgeon: Vanderbilt Ned, MD;  Location: [facility];  Service: General;  Laterality:  Left;   KNEE ARTHROSCOPY Bilateral    TOTAL KNEE ARTHROPLASTY Bilateral     SOCIAL HISTORY: Social History   Socioeconomic History   Marital status: Single    Spouse name: Not on file   Number of children: Not on file   Years of education: Not on file   Highest education level: Not on file  Occupational History   Not on file  Tobacco Use   Smoking status: Every Day    Current packs/day: 1.50    Average packs/day: 1.5 packs/day for 59.4 years (89.1 ttl pk-yrs)    Types: Cigarettes    Start date: 06/07/1964    Passive exposure: Current   Smokeless tobacco: Never  Vaping Use   Vaping status: Never Used  Substance and Sexual Activity   Alcohol use: Not Currently   Drug use: Not Currently    Types: Marijuana, Crack cocaine    Comment: None since 1999   Sexual activity: Not on file  Other Topics Concern   Not on file  Social History Narrative   Not on file   Social Drivers of Health   Financial Resource Strain: Not on file  Food Insecurity: Food Insecurity Present (09/13/2022)   Hunger Vital Sign    Worried About Running Out of Food in the Last Year: Never true    Ran Out of Food in the Last Year: Sometimes true  Transportation Needs: No Transportation Needs (09/13/2022)   PRAPARE - Administrator, Civil Service (Medical): No    Lack of Transportation (Non-Medical): No  Physical Activity: Not on file  Stress: Not on file  Social Connections: Not on file  Intimate Partner Violence: Not At Risk (09/13/2022)   Humiliation, Afraid, Rape, and Kick questionnaire    Fear of Current or Ex-Partner: No    Emotionally Abused: No    Physically Abused: No    Sexually Abused: No    FAMILY HISTORY: Family History  Problem Relation Age of Onset   Pulmonary embolism Mother    Heart Problems Mother    Hypertension Mother    Heart attack Father    Diabetes Father    Pulmonary embolism Sister    Healthy Sister    Pulmonary embolism Brother    Prostate cancer Brother  23   Kidney cancer Brother 94   Pulmonary embolism Brother    Ovarian cancer Maternal Aunt     ALLERGIES:  is allergic to codeine, codeine phosphate, and tramadol-acetaminophen .  MEDICATIONS:  Current Outpatient Medications  Medication Sig Dispense Refill   amLODipine (NORVASC) 10 MG tablet amlodipine 10 mg tablet     celecoxib (CELEBREX) 400 MG capsule Take 400 mg by mouth 2 (two) times daily.     cyclobenzaprine (FLEXERIL) 10 MG tablet Take 5-10 mg by mouth 3 (three) times daily as needed for muscle spasms.     diclofenac Sodium (VOLTAREN) 1 % GEL Apply 1 application topically 4 (four) times daily.     DULoxetine (CYMBALTA) 60 MG capsule Take 120 mg by mouth daily.     esomeprazole (NEXIUM) 40 MG capsule esomeprazole magnesium 40  mg capsule,delayed release  TAKE 1 CAPSULE BY MOUTH EVERY DAY     furosemide (LASIX) 40 MG tablet Take 40 mg by mouth daily.     hydrALAZINE (APRESOLINE) 25 MG tablet Take 25 mg by mouth daily.     HYDROcodone-acetaminophen  (NORCO/VICODIN) 5-325 MG tablet Take 1 tablet by mouth 2 (two) times daily as needed.     lidocaine  (LIDODERM ) 5 % 1 patch every 12 (twelve) hours.     losartan (COZAAR) 100 MG tablet Take 100 mg by mouth daily.     MOUNJARO 5 MG/0.5ML Pen Inject 5 mg into the skin once a week.     Multiple Vitamins-Minerals (CENTRUM ADULTS PO) Take by mouth daily.     nitroGLYCERIN  (NITROSTAT ) 0.4 MG SL tablet Place 1 tablet (0.4 mg total) under the tongue every 5 (five) minutes as needed. 30 tablet 3   nortriptyline (PAMELOR) 50 MG capsule Take by mouth at bedtime as needed.     ondansetron  (ZOFRAN ) 4 MG tablet Take 4 mg by mouth every 8 (eight) hours as needed for nausea.     potassium chloride (KLOR-CON M) 10 MEQ tablet 10 mEq daily as needed (When taking lasix for edema).     rivaroxaban (XARELTO) 20 MG TABS tablet 20 mg daily with supper.     rosuvastatin (CRESTOR) 20 MG tablet Take 20 mg by mouth daily.     Tiotropium Bromide-Olodaterol (STIOLTO  RESPIMAT) 2.5-2.5 MCG/ACT AERS INHALE 2 PUFFS BY MOUTH INTO THE LUNGS DAILY 4 g 0   Vitamin D, Ergocalciferol, (DRISDOL) 1.25 MG (50000 UNIT) CAPS capsule Take 50,000 Units by mouth once a week.     No current facility-administered medications for this visit.     PHYSICAL EXAMINATION: ECOG PERFORMANCE STATUS: 0 - Asymptomatic  BP (!) 143/68 (BP Location: Right Arm, Patient Position: Sitting)   Pulse 82   Temp 99.2 F (37.3 C) (Temporal)   Resp 18   Ht 5' 4 (1.626 m)   Wt 215 lb 4.8 oz (97.7 kg)   SpO2 97%   BMI 36.96 kg/m   GENERAL:alert, no distress and comfortable, walks with a walker No significant asymmetry of LE swelling, she is wearing compression socks CTA bilateral Mild tenderness both upper quadrant. No guarding or rigidity  LABORATORY DATA:  I have reviewed the data as listed Lab Results  Component Value Date   WBC 4.0 08/31/2022   HGB 12.4 08/31/2022   HCT 38.4 08/31/2022   MCV 93.2 08/31/2022   PLT 208 08/31/2022     Chemistry      Component Value Date/Time   NA 140 11/18/2022 1257   NA 144 06/03/2021 0939   K 4.6 11/18/2022 1257   CL 108 11/18/2022 1257   CO2 26 11/18/2022 1257   BUN 12 11/18/2022 1257   BUN 12 06/03/2021 0939   CREATININE 0.92 11/18/2022 1257   CREATININE 0.97 08/31/2022 1000      Component Value Date/Time   CALCIUM 8.9 11/18/2022 1257   ALKPHOS 41 08/31/2022 1000   AST 35 08/31/2022 1000   ALT 31 08/31/2022 1000   BILITOT 0.3 08/31/2022 1000       RADIOGRAPHIC STUDIES: I have personally reviewed the radiological images as listed and agreed with the findings in the report. No results found.  All questions were answered. The patient knows to call the clinic with any problems, questions or concerns. I spent 30 minutes in the care of this patient including H and P, review of records, counseling and coordination  of care.     Amber Stalls, MD 11/08/2023 2:27 PM

## 2023-11-08 NOTE — Assessment & Plan Note (Signed)
 This is a pleasant 68 year old female patient with DCIS status postlumpectomy, adjuvant radiation, could not tolerate anastrozole, DCIS [facility] nomogram suggests 3% risk of recurrence at 5 years and 5% risk of recurrence at 10 years now here for an interim follow-up.  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 Likely due to nerve regeneration. -Expect resolution over the next year.  No other concerns for breast cancer recurrence.  Breast Cancer Grade 3, treated with surgery and radiation. No family history of breast cancer. Discontinued hormonal therapy due to side effects. Discussed the risk of recurrence (3% at 5 years, 5% at 10 years) and the patient declined further hormonal therapy. -Continue surveillance with no further hormonal therapy. -Order mammogram in April 2025.  Follow-up in 6 months.

## 2023-11-09 LAB — BETA-2-GLYCOPROTEIN I ABS, IGG/M/A
Beta-2 Glyco I IgG: <9 GPI IgG units (ref 0–20)
Beta-2-Glycoprotein I IgA: <9 GPI IgA units (ref 0–25)
Beta-2-Glycoprotein I IgM: <9 GPI IgM units (ref 0–32)

## 2023-11-09 LAB — LUPUS ANTICOAGULANT PANEL
DRVVT: 34.5 s (ref 0.0–47.0)
PTT Lupus Anticoagulant: 27.5 s (ref 0.0–43.5)

## 2023-11-09 LAB — PROTEIN C ACTIVITY: Protein C Activity: 135 % (ref 73–180)

## 2023-11-09 LAB — PROTEIN S, TOTAL: Protein S Ag, Total: 98 % (ref 60–150)

## 2023-11-09 LAB — PROTEIN S ACTIVITY: Protein S Activity: 67 % (ref 63–140)

## 2023-11-10 LAB — CARDIOLIPIN ANTIBODIES, IGG, IGM, IGA
Anticardiolipin IgA: <9 U/mL (ref 0–11)
Anticardiolipin IgG: <9 GPL U/mL (ref 0–14)
Anticardiolipin IgM: 11 [MPL'U]/mL (ref 0–12)

## 2023-11-11 LAB — PROTEIN C, TOTAL: Protein C, Total: 119 % (ref 60–150)

## 2023-11-13 DIAGNOSIS — M545 Low back pain, unspecified: Secondary | ICD-10-CM | POA: Diagnosis not present

## 2023-11-13 DIAGNOSIS — M5432 Sciatica, left side: Secondary | ICD-10-CM | POA: Diagnosis not present

## 2023-11-13 DIAGNOSIS — G894 Chronic pain syndrome: Secondary | ICD-10-CM | POA: Diagnosis not present

## 2023-11-13 DIAGNOSIS — N1832 Chronic kidney disease, stage 3b: Secondary | ICD-10-CM | POA: Diagnosis not present

## 2023-11-13 DIAGNOSIS — Z79899 Other long term (current) drug therapy: Secondary | ICD-10-CM | POA: Diagnosis not present

## 2023-11-13 DIAGNOSIS — M25521 Pain in right elbow: Secondary | ICD-10-CM | POA: Diagnosis not present

## 2023-11-13 LAB — FACTOR 5 LEIDEN

## 2023-11-14 DIAGNOSIS — E782 Mixed hyperlipidemia: Secondary | ICD-10-CM | POA: Diagnosis not present

## 2023-11-14 DIAGNOSIS — E1142 Type 2 diabetes mellitus with diabetic polyneuropathy: Secondary | ICD-10-CM | POA: Diagnosis not present

## 2023-11-14 DIAGNOSIS — E1122 Type 2 diabetes mellitus with diabetic chronic kidney disease: Secondary | ICD-10-CM | POA: Diagnosis not present

## 2023-11-14 DIAGNOSIS — I2699 Other pulmonary embolism without acute cor pulmonale: Secondary | ICD-10-CM | POA: Diagnosis not present

## 2023-11-14 DIAGNOSIS — K219 Gastro-esophageal reflux disease without esophagitis: Secondary | ICD-10-CM | POA: Diagnosis not present

## 2023-11-14 DIAGNOSIS — N1831 Chronic kidney disease, stage 3a: Secondary | ICD-10-CM | POA: Diagnosis not present

## 2023-11-14 DIAGNOSIS — J4489 Other specified chronic obstructive pulmonary disease: Secondary | ICD-10-CM | POA: Diagnosis not present

## 2023-11-15 LAB — PROTHROMBIN GENE MUTATION

## 2023-11-23 DIAGNOSIS — R197 Diarrhea, unspecified: Secondary | ICD-10-CM | POA: Diagnosis not present

## 2023-11-27 ENCOUNTER — Telehealth: Payer: Self-pay

## 2023-11-27 DIAGNOSIS — R197 Diarrhea, unspecified: Secondary | ICD-10-CM | POA: Diagnosis not present

## 2023-11-27 DIAGNOSIS — K8020 Calculus of gallbladder without cholecystitis without obstruction: Secondary | ICD-10-CM | POA: Diagnosis not present

## 2023-11-27 NOTE — Telephone Encounter (Signed)
 Patient confirmed

## 2023-11-28 ENCOUNTER — Inpatient Hospital Stay
Admission: RE | Admit: 2023-11-28 | Discharge: 2023-11-28 | Discharge status: Home / Self Care | Source: Ambulatory Visit | Attending: Hematology and Oncology

## 2023-11-28 ENCOUNTER — Telehealth: Payer: Self-pay

## 2023-11-28 ENCOUNTER — Inpatient Hospital Stay (HOSPITAL_BASED_OUTPATIENT_CLINIC_OR_DEPARTMENT_OTHER): Payer: 59 | Admitting: Hematology and Oncology

## 2023-11-28 VITALS — BP 123/63 | HR 64 | Temp 98.4°F | Resp 18 | Wt 232.6 lb

## 2023-11-28 DIAGNOSIS — I10 Essential (primary) hypertension: Secondary | ICD-10-CM | POA: Diagnosis not present

## 2023-11-28 DIAGNOSIS — I2510 Atherosclerotic heart disease of native coronary artery without angina pectoris: Secondary | ICD-10-CM | POA: Diagnosis not present

## 2023-11-28 DIAGNOSIS — Z833 Family history of diabetes mellitus: Secondary | ICD-10-CM | POA: Diagnosis not present

## 2023-11-28 DIAGNOSIS — Z825 Family history of asthma and other chronic lower respiratory diseases: Secondary | ICD-10-CM | POA: Diagnosis not present

## 2023-11-28 DIAGNOSIS — I2699 Other pulmonary embolism without acute cor pulmonale: Secondary | ICD-10-CM | POA: Diagnosis not present

## 2023-11-28 DIAGNOSIS — D0512 Intraductal carcinoma in situ of left breast: Secondary | ICD-10-CM

## 2023-11-28 DIAGNOSIS — K819 Cholecystitis, unspecified: Secondary | ICD-10-CM | POA: Diagnosis not present

## 2023-11-28 DIAGNOSIS — K8681 Exocrine pancreatic insufficiency: Secondary | ICD-10-CM | POA: Diagnosis not present

## 2023-11-28 DIAGNOSIS — E119 Type 2 diabetes mellitus without complications: Secondary | ICD-10-CM | POA: Diagnosis not present

## 2023-11-28 DIAGNOSIS — Z8249 Family history of ischemic heart disease and other diseases of the circulatory system: Secondary | ICD-10-CM | POA: Diagnosis not present

## 2023-11-28 DIAGNOSIS — Z8051 Family history of malignant neoplasm of kidney: Secondary | ICD-10-CM | POA: Diagnosis not present

## 2023-11-28 DIAGNOSIS — F17210 Nicotine dependence, cigarettes, uncomplicated: Secondary | ICD-10-CM | POA: Diagnosis not present

## 2023-11-28 DIAGNOSIS — Z08 Encounter for follow-up examination after completed treatment for malignant neoplasm: Secondary | ICD-10-CM | POA: Diagnosis not present

## 2023-11-28 DIAGNOSIS — Z79899 Other long term (current) drug therapy: Secondary | ICD-10-CM | POA: Diagnosis not present

## 2023-11-28 DIAGNOSIS — Z853 Personal history of malignant neoplasm of breast: Secondary | ICD-10-CM | POA: Diagnosis not present

## 2023-11-28 DIAGNOSIS — Z86718 Personal history of other venous thrombosis and embolism: Secondary | ICD-10-CM | POA: Diagnosis not present

## 2023-11-28 DIAGNOSIS — J4489 Other specified chronic obstructive pulmonary disease: Secondary | ICD-10-CM | POA: Diagnosis not present

## 2023-11-28 DIAGNOSIS — Z885 Allergy status to narcotic agent status: Secondary | ICD-10-CM | POA: Diagnosis not present

## 2023-11-28 DIAGNOSIS — Z86711 Personal history of pulmonary embolism: Secondary | ICD-10-CM | POA: Diagnosis not present

## 2023-11-28 DIAGNOSIS — Z7901 Long term (current) use of anticoagulants: Secondary | ICD-10-CM | POA: Diagnosis not present

## 2023-11-28 NOTE — Progress Notes (Signed)
 [facility] CONSULT NOTE  Patient Care Team: Catalina Bare, MD as PCP - General (Internal Medicine) Monetta Redell PARAS, MD as Consulting Physician (Cardiology) Vanderbilt Ned, MD as Consulting Physician (General Surgery) Dewey Rush, MD as Consulting Physician (Radiation Oncology) Loretha Ash, MD as Consulting Physician (Hematology and Oncology)  CHIEF COMPLAINTS/PURPOSE OF CONSULTATION:  DCIS  ASSESSMENT & PLAN:   Assessment and Plan Assessment & Plan  Cholecystitis Cholecystitis with scheduled cholecystectomy on August 7th. Discussed potential post-surgical diarrhea, especially with high-fat foods. - Proceed with cholecystectomy on August 7th.  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 Creon prescribed to aid digestion by PCP.  Deep vein thrombosis and pulmonary embolism Multiple blood clots with negative hypercoagulable workup. Lifelong anticoagulation with Xarelto required. Pre-surgical anticoagulation management discussed. - Stop Xarelto on August 4th. - Start Lovenox  on August 4th and continue on 5th and 6th. - Do not take any anticoagulants on the day of surgery, August 7th. - Consult with surgeon post-surgery to determine when to resume Xarelto.  Chronic inflammation Chronic inflammation with joint swelling and pain. Prednisone  not recommended for long-term use. Alternative pain management discussed. - Use extra strength Tylenol  for pain management, not to avoid more than 2 g/day. - Refer to orthopedic surgeon post-cholecystectomy for joint evaluation.  DCIS Early-stage breast cancer. Unable to take hormone therapy, thus monitoring with mammograms. - Perform mammogram today.  RTC as scheduled.   HISTORY OF PRESENTING ILLNESS:  [name] 68 y.o. female is here because of DCIS  Oncology History  Ductal carcinoma in situ (DCIS) of left breast  07/04/2022 Mammogram   Screening  mammogram suggested further evaluation for calcifications in the left breast.  She then had a diagnostic mammogram which showed 4.8 cm span of indeterminate calcifications in the left breast warranting tissue diagnosis.   08/23/2022 Pathology Results   Left breast needle core biopsy showed grade 3 DCIS, necrosis and calcifications present, prognostic showed ER 100% positive strong staining PR 60% positive moderate to strong staining   09/06/2022 Genetic Testing   Negative genetic testing on the Multi-Cancer+RNA gene panel.  CHEK2 c.164C>T (p.Ser55Phe), CHEK2 c.542G>A (p.Arg181His), MSH3 c.2600T>C (p.Ile867Thr), and RET c.2611G>A (p.Val871Ile) VUS were identified.  The report date is September 06, 2022.  The Multi-Cancer + RNA Panel offered by [facility] includes sequencing and/or deletion/duplication analysis of the following 70 genes:  AIP*, ALK, APC*, ATM*, AXIN2*, BAP1*, BARD1*, BLM*, BMPR1A*, BRCA1*, BRCA2*, BRIP1*, CDC73*, CDH1*, CDK4, CDKN1B*, CDKN2A, CHEK2*, CTNNA1*, DICER1*, EPCAM (del/dup only), EGFR, FH*, FLCN*, GREM1 (promoter dup only), HOXB13, KIT, LZTR1, MAX*, MBD4, MEN1*, MET, MITF, MLH1*, MSH2*, MSH3*, MSH6*, MUTYH*, NF1*, NF2*, NTHL1*, PALB2*, PDGFRA, PMS2*, POLD1*, POLE*, POT1*, PRKAR1A*, PTCH1*, PTEN*, RAD51C*, RAD51D*, RB1*, RET, SDHA* (sequencing only), SDHAF2*, SDHB*, SDHC*, SDHD*, SMAD4*, SMARCA4*, SMARCB1*, SMARCE1*, STK11*, SUFU*, TMEM127*, TP53*, TSC1*, TSC2*, VHL*. RNA analysis is performed for * genes.    11/23/2022 Surgery   DCIS, 4cm, intermediate to high grade, with necrosis, ER/PR psoitive   11/23/2022 Cancer Staging   Staging form: Breast, AJCC 8th Edition - Pathologic stage from 11/23/2022: Stage 0 (pTis (DCIS), pN0, cM0, ER+, PR+) - Signed by Crawford Morna Pickle, NP on 05/29/2023 Stage prefix: Initial diagnosis   01/12/2023 - 02/10/2023 Radiation Therapy   Plan Name: Breast_L_BH Site: Breast, Left Technique: 3D Mode: Photon Dose Per Fraction: 2.66 Gy Prescribed Dose  (Delivered / Prescribed): 42.56 Gy / 42.56 Gy Prescribed Fxs (Delivered / Prescribed): 16 / 16   Plan Name: Brst_L_Bst_BH Site: Breast, Left Technique: 3D  Mode: Photon Dose Per Fraction: 2 Gy Prescribed Dose (Delivered / Prescribed): 8 Gy / 8 Gy Prescribed Fxs (Delivered / Prescribed): 4 / 4   03/2023 -  Anti-estrogen oral therapy   Anastrozole     At baseline she has COPD, diabetes, 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  No history of breast cancer in the family however several other cancers run in the family.  Maternal aunt had ovarian cancer, sister had renal cancer, dad had metastatic carcinoma of unknown primary.  She has 2 children, 2 boys.  No history of hormone replacement therapy.  She complains of occasional back pain for which she takes prednisone , otherwise significant history of DVT for which she stayed on Coumadin and PE subsequently hence she was recommended indefinite anticoagulation.  There is also strong family history of blood clots.  Sister had saddle pulmonary embolism.    She had left breast lumpectomy which showed DCIS, cribriform and solid intermediate to high nuclear grade with necrosis, DCIS measured 4 cm, negative margins, ER/PR positive.  ER 100% positive strong staining PR 40% positive moderate to strong staining.    Discussed the use of AI scribe software for clinical note transcription with the patient, who gave verbal consent to proceed.  History of Present Illness  [name] is a 68 year old female with pancreatic exocrine insufficiency and a history of cholecystitis who presents for follow-up regarding her pancreatic insufficiency and upcoming cholecystectomy.  She is scheduled for a cholecystectomy on August 7th due to cholecystitis. She is currently taking Creon for pancreatic exocrine insufficiency, which causes diarrhea and bloating. She is concerned  about the necessity of the surgery and its potential impact on her pancreatic condition.  She has a history of early-stage breast cancer and is scheduled for a mammogram today. She is not taking any medication for breast cancer due to intolerance. She is concerned about the risk of pancreatic cancer due to her pancreatic issues.  She has a history of multiple blood clots and is on lifelong anticoagulation therapy. She is currently taking Xarelto and will switch to Lovenox  three days before her surgery.  She experiences chronic diarrhea and inflammation, for which she takes Lomotil, Lomedia, and Imodium. She reports swelling in her lower back and hip area, which she attributes to chronic inflammation. She has had steroid injections in the past but finds them insufficient for her symptoms.  She is scheduled for a bone density test at the [facility].  Rest of the pertinent 10 point ROS reviewed and negative  MEDICAL HISTORY:  Past Medical History:  Diagnosis Date   Asthma    Breast cancer (HCC) 08/23/2022   Bronchitis, asthmatic    COPD (chronic obstructive pulmonary disease) (HCC)    Coronary artery calcification seen on CT scan    Diabetes (HCC)    DVT (deep venous thrombosis) (HCC)    Family history of kidney cancer    Family history of ovarian cancer    Family history of prostate cancer    Hyperlipidemia    Hypertension    Observed sleep apnea     SURGICAL HISTORY: Past Surgical History:  Procedure Laterality Date   ABDOMINAL HYSTERECTOMY     BREAST BIOPSY Left 08/23/2022   MM LT BREAST BX W LOC DEV 1ST LESION IMAGE BX SPEC STEREO GUIDE 08/23/2022 [facility]   BREAST BIOPSY Left 08/23/2022   MM  LT BREAST BX W LOC DEV EA AD LESION IMG BX SPEC STEREO GUIDE 08/23/2022 [facility]   BREAST BIOPSY  11/22/2022   MM LT RADIOACTIVE SEED EA ADD LESION LOC MAMMO GUIDE 11/22/2022 [facility]   BREAST BIOPSY  11/22/2022   MM LT RADIOACTIVE SEED LOC MAMMO GUIDE  11/22/2022 [facility]   BREAST LUMPECTOMY WITH RADIOACTIVE SEED LOCALIZATION Left 11/23/2022   Procedure: LEFT BREAST SEED BRACKETED CENTRAL LUMPECTOMY AND NIPPLE RESECTION;  Surgeon: Vanderbilt Ned, MD;  Location: [facility];  Service: General;  Laterality: Left;   KNEE ARTHROSCOPY Bilateral    TOTAL KNEE ARTHROPLASTY Bilateral     SOCIAL HISTORY: Social History   Socioeconomic History   Marital status: Single    Spouse name: Not on file   Number of children: Not on file   Years of education: Not on file   Highest education level: Not on file  Occupational History   Not on file  Tobacco Use   Smoking status: Every Day    Current packs/day: 1.50    Average packs/day: 1.5 packs/day for 59.5 years (89.2 ttl pk-yrs)    Types: Cigarettes    Start date: 06/07/1964    Passive exposure: Current   Smokeless tobacco: Never  Vaping Use   Vaping status: Never Used  Substance and Sexual Activity   Alcohol use: Not Currently   Drug use: Not Currently    Types: Marijuana, Crack cocaine    Comment: None since 1999   Sexual activity: Not on file  Other Topics Concern   Not on file  Social History Narrative   Not on file   Social Drivers of Health   Financial Resource Strain: Not on file  Food Insecurity: Food Insecurity Present (09/13/2022)   Hunger Vital Sign    Worried About Running Out of Food in the Last Year: Never true    Ran Out of Food in the Last Year: Sometimes true  Transportation Needs: No Transportation Needs (09/13/2022)   PRAPARE - Administrator, Civil Service (Medical): No    Lack of Transportation (Non-Medical): No  Physical Activity: Not on file  Stress: Not on file  Social Connections: Not on file  Intimate Partner Violence: Not At Risk (09/13/2022)   Humiliation, Afraid, Rape, and Kick questionnaire    Fear of Current or Ex-Partner: No    Emotionally Abused: No    Physically Abused: No    Sexually Abused: No    FAMILY  HISTORY: Family History  Problem Relation Age of Onset   Pulmonary embolism Mother    Heart Problems Mother    Hypertension Mother    Heart attack Father    Diabetes Father    Pulmonary embolism Sister    Healthy Sister    Pulmonary embolism Brother    Prostate cancer Brother 30   Kidney cancer Brother 61   Pulmonary embolism Brother    Ovarian cancer Maternal Aunt     ALLERGIES:  is allergic to codeine, codeine phosphate, and tramadol-acetaminophen .  MEDICATIONS:  Current Outpatient Medications  Medication Sig Dispense Refill   amLODipine (NORVASC) 10 MG tablet amlodipine 10 mg tablet     celecoxib (CELEBREX) 400 MG capsule Take 400 mg by mouth 2 (two) times daily.     cyclobenzaprine (FLEXERIL) 10 MG tablet Take 5-10 mg by mouth 3 (three) times daily as needed for muscle spasms.     diclofenac Sodium (VOLTAREN) 1 % GEL Apply 1 application topically 4 (four) times daily.  DULoxetine (CYMBALTA) 60 MG capsule Take 120 mg by mouth daily.     enoxaparin  (LOVENOX ) 100 MG/ML injection Inject 1 mL (100 mg total) into the skin every 12 (twelve) hours. 6 mL 0   esomeprazole (NEXIUM) 40 MG capsule esomeprazole magnesium 40 mg capsule,delayed release  TAKE 1 CAPSULE BY MOUTH EVERY DAY     furosemide (LASIX) 40 MG tablet Take 40 mg by mouth daily.     hydrALAZINE (APRESOLINE) 25 MG tablet Take 25 mg by mouth daily.     HYDROcodone-acetaminophen  (NORCO/VICODIN) 5-325 MG tablet Take 1 tablet by mouth 2 (two) times daily as needed.     lidocaine  (LIDODERM ) 5 % 1 patch every 12 (twelve) hours.     losartan (COZAAR) 100 MG tablet Take 100 mg by mouth daily.     MOUNJARO 5 MG/0.5ML Pen Inject 5 mg into the skin once a week.     Multiple Vitamins-Minerals (CENTRUM ADULTS PO) Take by mouth daily.     nitroGLYCERIN  (NITROSTAT ) 0.4 MG SL tablet Place 1 tablet (0.4 mg total) under the tongue every 5 (five) minutes as needed. 30 tablet 3   nortriptyline (PAMELOR) 50 MG capsule Take by mouth at  bedtime as needed.     ondansetron  (ZOFRAN ) 4 MG tablet Take 4 mg by mouth every 8 (eight) hours as needed for nausea.     potassium chloride (KLOR-CON M) 10 MEQ tablet 10 mEq daily as needed (When taking lasix for edema).     rivaroxaban (XARELTO) 20 MG TABS tablet 20 mg daily with supper.     rosuvastatin (CRESTOR) 20 MG tablet Take 20 mg by mouth daily.     Tiotropium Bromide-Olodaterol (STIOLTO RESPIMAT ) 2.5-2.5 MCG/ACT AERS INHALE 2 PUFFS BY MOUTH INTO THE LUNGS DAILY 4 g 0   Vitamin D, Ergocalciferol, (DRISDOL) 1.25 MG (50000 UNIT) CAPS capsule Take 50,000 Units by mouth once a week.     No current facility-administered medications for this visit.     PHYSICAL EXAMINATION: ECOG PERFORMANCE STATUS: 0 - Asymptomatic  BP 123/63 (BP Location: Right Arm, Patient Position: Sitting, Cuff Size: Large)   Pulse 64   Temp 98.4 F (36.9 C) (Temporal)   Resp 18   Wt 232 lb 9.6 oz (105.5 kg)   SpO2 100%   BMI 39.93 kg/m   GENERAL:alert, no distress and comfortable, walks with a walker  LABORATORY DATA:  I have reviewed the data as listed Lab Results  Component Value Date   WBC 4.0 08/31/2022   HGB 12.4 08/31/2022   HCT 38.4 08/31/2022   MCV 93.2 08/31/2022   PLT 208 08/31/2022     Chemistry      Component Value Date/Time   NA 140 11/18/2022 1257   NA 144 06/03/2021 0939   K 4.6 11/18/2022 1257   CL 108 11/18/2022 1257   CO2 26 11/18/2022 1257   BUN 12 11/18/2022 1257   BUN 12 06/03/2021 0939   CREATININE 0.92 11/18/2022 1257   CREATININE 0.97 08/31/2022 1000      Component Value Date/Time   CALCIUM 8.9 11/18/2022 1257   ALKPHOS 41 08/31/2022 1000   AST 35 08/31/2022 1000   ALT 31 08/31/2022 1000   BILITOT 0.3 08/31/2022 1000       RADIOGRAPHIC STUDIES: I have personally reviewed the radiological images as listed and agreed with the findings in the report. No results found.  All questions were answered. The patient knows to call the clinic with any problems,  questions or concerns.  I spent 30 minutes in the care of this patient including H and P, review of records, counseling and coordination of care.     Amber Stalls, MD 11/28/2023 11:38 AM

## 2023-11-28 NOTE — Progress Notes (Signed)
   11/28/2023  Patient ID: [name], female   DOB: 10-Sep-1955, 68 y.o.   MRN: [id number]  Contacted patient regarding referral for medication management from Catalina Bare, MD .   Left patient a voicemail to return my call at their convenience  Heather Factor, PharmD Clinical Pharmacist  [phone]

## 2023-11-30 ENCOUNTER — Encounter: Payer: Self-pay | Admitting: Genetic Counselor

## 2023-12-05 NOTE — Progress Notes (Signed)
   12/05/2023  Patient ID: [name] Pouch, female   DOB: 10-23-1955, 68 y.o.   MRN: [id number]  Contacted patient regarding referral for medication management from Catalina Bare, MD .   Appointment scheduled 12/13/23 at 11:00am.   Heather Factor, PharmD Clinical Pharmacist  [phone]

## 2023-12-08 ENCOUNTER — Ambulatory Visit: Payer: Self-pay | Admitting: Surgery

## 2023-12-08 NOTE — Progress Notes (Signed)
 Surgical Instructions   Your procedure is scheduled on Thursday, August 7th, 2025. Report to [name] A at 5:30 A.M., then check in with the Admitting office. Any questions or running late day of surgery: call [phone]  Questions prior to your surgery date: call [phone], Monday-Friday, 8am-4pm. If you experience any cold or flu symptoms such as cough, fever, chills, shortness of breath, etc. between now and your scheduled surgery, please notify us  at the above number.     Remember:  Do not eat after midnight the night before your surgery   You may drink clear liquids until 4:30 the morning of your surgery.   Clear liquids allowed are: Water, Non-Citrus Juices (without pulp), Carbonated Beverages, Clear Tea (no milk, honey, etc.), Black Coffee Only (NO MILK, CREAM OR POWDERED CREAMER of any kind), and Gatorade.    Take these medicines the morning of surgery with A SIP OF WATER: Amlodipine (Norvasc) Duloxetine (Cymbalta) Esomeprazole (Nexium) Hydralazine (Apresoline) Hyrdrocodone-acetaminophen  (Norco/Vicodin) Rosuvastatin (Crestor)    May take these medicines IF NEEDED: Albuterol (Ventolin HFA) - please bring with you on the day of surgery Ondansetron  (Zofran ) Nitroglycerin  (Nitrostat ) - if you need to take this medication, please let a nurse know at [phone] after taking   Per your hematologist, please follow the following instructions for your Rivaroxaban (Xarelto) and Enoxaparin  (Lovenox )    -Stop Rivaroxaban (Xarelto) on Monday, August 4th.    -Start Enoxaparin  (Lovenox ) on August 4th and continue on August 5th and 6th.    -Do not take any anticoagulants on the day of surgery.      One week prior to surgery, STOP taking any Aspirin (unless otherwise instructed by your surgeon) Aleve, Naproxen, Ibuprofen, Motrin, Advil, Goody's, BC's, all herbal medications, fish oil, and non-prescription vitamins.  This includes Celecoxib (Celebrex) and  Diclofenac Sodium (Voltaren) Gel.   WHAT DO I DO ABOUT MY DIABETES MEDICATION?   Tirzepatide (Mounjaro) should be stopped 7 days prior to surgery.  Your last dose should be on or before July 30th.  Jardiance should be held for 72 hours prior to surgery.  Your last dose should be on Sunday, August 3rd.         HOW TO MANAGE YOUR DIABETES BEFORE AND AFTER SURGERY  Why is it important to control my blood sugar before and after surgery? Improving blood sugar levels before and after surgery helps healing and can limit problems. A way of improving blood sugar control is eating a healthy diet by:  Eating less sugar and carbohydrates  Increasing activity/exercise  Talking with your doctor about reaching your blood sugar goals High blood sugars (greater than 180 mg/dL) can raise your risk of infections and slow your recovery, so you will need to focus on controlling your diabetes during the weeks before surgery. Make sure that the doctor who takes care of your diabetes knows about your planned surgery including the date and location.  How do I manage my blood sugar before surgery? Check your blood sugar at least 4 times a day, starting 2 days before surgery, to make sure that the level is not too high or low.  Check your blood sugar the morning of your surgery when you wake up and every 2 hours until you get to the Short Stay unit.  If your blood sugar is less than 70 mg/dL, you will need to treat for low blood sugar: Do not take insulin. Treat a low blood sugar (less than 70 mg/dL) with  cup of  clear juice (cranberry or apple), 4 glucose tablets, OR glucose gel. Recheck blood sugar in 15 minutes after treatment (to make sure it is greater than 70 mg/dL). If your blood sugar is not greater than 70 mg/dL on recheck, call [phone] for further instructions. Report your blood sugar to the short stay nurse when you get to Short Stay.  If you are admitted to the [name] after  surgery: Your blood sugar will be checked by the staff and you will probably be given insulin after surgery (instead of oral diabetes medicines) to make sure you have good blood sugar levels. The goal for blood sugar control after surgery is 80-180 mg/dL.                      Do NOT Smoke (Tobacco/Vaping) for 24 hours prior to your procedure.  If you use a CPAP at night, you may bring your mask/headgear for your overnight stay.   You will be asked to remove any contacts, glasses, piercing's, hearing aid's, dentures/partials prior to surgery. Please bring cases for these items if needed.    Patients discharged the day of surgery will not be allowed to drive home, and someone needs to stay with them for 24 hours.  SURGICAL WAITING ROOM VISITATION Patients may have no more than 2 support people in the waiting area - these visitors may rotate.   Pre-op nurse will coordinate an appropriate time for 1 ADULT support person, who may not rotate, to accompany patient in pre-op.  Children under the age of 51 must have an adult with them who is not the patient and must remain in the main waiting area with an adult.  If the patient needs to stay at the [name] during part of their recovery, the visitor guidelines for inpatient rooms apply.  Please refer to the [name] website for the visitor guidelines for any additional information.   If you received a COVID test during your pre-op visit  it is requested that you wear a mask when out in public, stay away from anyone that may not be feeling well and notify your surgeon if you develop symptoms. If you have been in contact with anyone that has tested positive in the last 10 days please notify you surgeon.      Pre-operative CHG Bathing Instructions   You can play a key role in reducing the risk of infection after surgery. Your skin needs to be as free of germs as possible. You can reduce the number of germs on your skin by washing with CHG  (chlorhexidine  gluconate) soap before surgery. CHG is an antiseptic soap that kills germs and continues to kill germs even after washing.   DO NOT use if you have an allergy to chlorhexidine /CHG or antibacterial soaps. If your skin becomes reddened or irritated, stop using the CHG and notify one of our RNs at [phone].              TAKE A SHOWER THE NIGHT BEFORE SURGERY AND THE DAY OF SURGERY    Please keep in mind the following:  DO NOT shave, including legs and underarms, 48 hours prior to surgery.   You may shave your face before/day of surgery.  Place clean sheets on your bed the night before surgery Use a clean washcloth (not used since being washed) for each shower. DO NOT sleep with pet's night before surgery.  CHG Shower Instructions:  Wash your face and private area with normal soap.  If you choose to wash your hair, wash first with your normal shampoo.  After you use shampoo/soap, rinse your hair and body thoroughly to remove shampoo/soap residue.  Turn the water OFF and apply half the bottle of CHG soap to a CLEAN washcloth.  Apply CHG soap ONLY FROM YOUR NECK DOWN TO YOUR TOES (washing for 3-5 minutes)  DO NOT use CHG soap on face, private areas, open wounds, or sores.  Pay special attention to the area where your surgery is being performed.  If you are having back surgery, having someone wash your back for you may be helpful. Wait 2 minutes after CHG soap is applied, then you may rinse off the CHG soap.  Pat dry with a clean towel  Put on clean pajamas    Additional instructions for the day of surgery: DO NOT APPLY any lotions, deodorants, cologne, or perfumes.   Do not wear jewelry or makeup Do not wear nail polish, gel polish, artificial nails, or any other type of covering on natural nails (fingers and toes) Do not bring valuables to the [name]. [facility] is not responsible for valuables/personal belongings. Put on clean/comfortable clothes.  Please brush your  teeth.  Ask your nurse before applying any prescription medications to the skin.

## 2023-12-11 ENCOUNTER — Other Ambulatory Visit: Payer: Self-pay

## 2023-12-11 ENCOUNTER — Encounter (HOSPITAL_COMMUNITY): Payer: Self-pay | Admitting: Surgery

## 2023-12-11 ENCOUNTER — Encounter (HOSPITAL_COMMUNITY)
Admission: RE | Admit: 2023-12-11 | Discharge: 2023-12-11 | Disposition: A | Discharge status: Home / Self Care | Source: Ambulatory Visit | Attending: Surgery

## 2023-12-11 DIAGNOSIS — Z79899 Other long term (current) drug therapy: Secondary | ICD-10-CM | POA: Insufficient documentation

## 2023-12-11 DIAGNOSIS — Z86711 Personal history of pulmonary embolism: Secondary | ICD-10-CM | POA: Diagnosis not present

## 2023-12-11 DIAGNOSIS — E119 Type 2 diabetes mellitus without complications: Secondary | ICD-10-CM | POA: Insufficient documentation

## 2023-12-11 DIAGNOSIS — K219 Gastro-esophageal reflux disease without esophagitis: Secondary | ICD-10-CM | POA: Insufficient documentation

## 2023-12-11 DIAGNOSIS — G4733 Obstructive sleep apnea (adult) (pediatric): Secondary | ICD-10-CM | POA: Diagnosis not present

## 2023-12-11 DIAGNOSIS — Z7901 Long term (current) use of anticoagulants: Secondary | ICD-10-CM | POA: Diagnosis not present

## 2023-12-11 DIAGNOSIS — Z01812 Encounter for preprocedural laboratory examination: Secondary | ICD-10-CM | POA: Insufficient documentation

## 2023-12-11 DIAGNOSIS — R600 Localized edema: Secondary | ICD-10-CM | POA: Diagnosis not present

## 2023-12-11 DIAGNOSIS — J449 Chronic obstructive pulmonary disease, unspecified: Secondary | ICD-10-CM | POA: Insufficient documentation

## 2023-12-11 DIAGNOSIS — I2510 Atherosclerotic heart disease of native coronary artery without angina pectoris: Secondary | ICD-10-CM | POA: Insufficient documentation

## 2023-12-11 DIAGNOSIS — Z720 Tobacco use: Secondary | ICD-10-CM | POA: Insufficient documentation

## 2023-12-11 DIAGNOSIS — E785 Hyperlipidemia, unspecified: Secondary | ICD-10-CM | POA: Diagnosis not present

## 2023-12-11 DIAGNOSIS — Z7985 Long-term (current) use of injectable non-insulin antidiabetic drugs: Secondary | ICD-10-CM | POA: Diagnosis not present

## 2023-12-11 DIAGNOSIS — Z86718 Personal history of other venous thrombosis and embolism: Secondary | ICD-10-CM | POA: Insufficient documentation

## 2023-12-11 DIAGNOSIS — I10 Essential (primary) hypertension: Secondary | ICD-10-CM | POA: Insufficient documentation

## 2023-12-11 LAB — CBC
HCT: 36.9 % (ref 36.0–46.0)
Hemoglobin: 11.9 g/dL — ABNORMAL LOW (ref 12.0–15.0)
MCH: 30.7 pg (ref 26.0–34.0)
MCHC: 32.2 g/dL (ref 30.0–36.0)
MCV: 95.3 fL (ref 80.0–100.0)
Platelets: 231 K/uL (ref 150–400)
RBC: 3.87 MIL/uL (ref 3.87–5.11)
RDW: 14.1 % (ref 11.5–15.5)
WBC: 3.6 K/uL — ABNORMAL LOW (ref 4.0–10.5)
nRBC: 0 % (ref 0.0–0.2)

## 2023-12-11 LAB — BASIC METABOLIC PANEL WITH GFR
Anion gap: 9 (ref 5–15)
BUN: 9 mg/dL (ref 8–23)
CO2: 26 mmol/L (ref 22–32)
Calcium: 9.1 mg/dL (ref 8.9–10.3)
Chloride: 109 mmol/L (ref 98–111)
Creatinine, Ser: 1.02 mg/dL — ABNORMAL HIGH (ref 0.44–1.00)
GFR, Estimated: 60 mL/min — ABNORMAL LOW (ref >60)
Glucose, Bld: 93 mg/dL (ref 70–99)
Potassium: 3.3 mmol/L — ABNORMAL LOW (ref 3.5–5.1)
Sodium: 144 mmol/L (ref 135–145)

## 2023-12-11 LAB — GLUCOSE, CAPILLARY: Glucose-Capillary: 90 mg/dL (ref 70–99)

## 2023-12-11 NOTE — Progress Notes (Addendum)
 PCP - Dr. Zachary Osei-Bonsu Cardiologist -Dr. Redell Leiter, LOV 09/15/2023 Hematologist: Dr. Thyra Stalls  PPM/ICD - denies Device Orders - na Rep Notified - na  Chest x-ray - na EKG - 09/15/2023 Stress Test -  03/22/2022 ECHO - 03/22/2022 Cardiac Cath -   Sleep Study -  OSA  CPAP - does not use  Type II diabetic. Blood sugar 90 at PAT appointment. A1C drawn today Fasting Blood Sugar: 90-100 Checks Blood Sugar: once a month  Last dose of GLP1 agonist-  Mounjaro GLP1 instructions: Hold 7 days, last dose 12/04/2023  Blood Thinner Instructions:Hematology instructions:  Stop xarelto on  12/11/2023 Start lovenox  12/11/2023 and continue 8/5, 8/6.  Do not take any anticoagulants on DOS Aspirin Instructions:denies  ERAS Protcol -Clears until 0430  Anesthesia review: Yes. DM, CAD, HTN, COPD, OSA, PE, DVT on xarelto  Patient denies shortness of breath, fever, cough and chest pain at PAT appointment   All instructions explained to the patient, with a verbal understanding of the material. Patient agrees to go over the instructions while at home for a better understanding. Patient also instructed to self quarantine after being tested for COVID-19. The opportunity to ask questions was provided.

## 2023-12-12 DIAGNOSIS — M5432 Sciatica, left side: Secondary | ICD-10-CM | POA: Diagnosis not present

## 2023-12-12 DIAGNOSIS — M25521 Pain in right elbow: Secondary | ICD-10-CM | POA: Diagnosis not present

## 2023-12-12 DIAGNOSIS — N1832 Chronic kidney disease, stage 3b: Secondary | ICD-10-CM | POA: Diagnosis not present

## 2023-12-12 DIAGNOSIS — Z79899 Other long term (current) drug therapy: Secondary | ICD-10-CM | POA: Diagnosis not present

## 2023-12-12 DIAGNOSIS — M545 Low back pain, unspecified: Secondary | ICD-10-CM | POA: Diagnosis not present

## 2023-12-12 DIAGNOSIS — G894 Chronic pain syndrome: Secondary | ICD-10-CM | POA: Diagnosis not present

## 2023-12-12 LAB — HEMOGLOBIN A1C
Hgb A1c MFr Bld: 5.7 % — ABNORMAL HIGH (ref 4.8–5.6)
Mean Plasma Glucose: 117 mg/dL

## 2023-12-12 NOTE — Anesthesia Preprocedure Evaluation (Signed)
 Anesthesia Evaluation  Patient identified by MRN, date of birth, ID band Patient awake    Reviewed: Allergy & Precautions, NPO status , Patient's Chart, lab work & pertinent test results  History of Anesthesia Complications Negative for: history of anesthetic complications  Airway Mallampati: III  TM Distance: >3 FB Neck ROM: Full    Dental  (+) Edentulous Lower, Edentulous Upper   Pulmonary sleep apnea , COPD,  COPD inhaler, Current Smoker   Pulmonary exam normal        Cardiovascular hypertension, Pt. on medications + CAD  Normal cardiovascular exam     Neuro/Psych negative neurological ROS     GI/Hepatic ,GERD  Medicated and Controlled,,Remote cocaine abuse gallstones   Endo/Other  diabetes (on Mounjaro), Type 2, Oral Hypoglycemic Agents  BMI 39  Renal/GU negative Renal ROS  negative genitourinary   Musculoskeletal  (+) Arthritis ,    Abdominal   Peds  Hematology negative hematology ROS (+)   Anesthesia Other Findings Day of surgery medications reviewed with patient.  Reproductive/Obstetrics negative OB ROS                              Anesthesia Physical Anesthesia Plan  ASA: 3  Anesthesia Plan: General   Post-op Pain Management: Tylenol  PO (pre-op)*   Induction: Intravenous  PONV Risk Score and Plan: 4 or greater and Treatment may vary due to age or medical condition, Ondansetron , Dexamethasone , Midazolam  and Propofol  infusion  Airway Management Planned: Oral ETT  Additional Equipment: None  Intra-op Plan:   Post-operative Plan: Extubation in OR  Informed Consent: I have reviewed the patients History and Physical, chart, labs and discussed the procedure including the risks, benefits and alternatives for the proposed anesthesia with the patient or authorized representative who has indicated his/her understanding and acceptance.     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 coronary CTA in 05/2021 that showed a coronary calcium score of 386 (94th percentile). There was extensive plaque noted, but it did not appear obstructive. Echocardiogram in 03/2022 showed EF 60-65%, no regional wall motion abnormalities, normal RV systolic function, normal pulmonary artery systolic pressure. ETT in 03/2022 was negative for ischemia, normal HR and BP response to exercise. Seen by Rollo Louder, PA-C on 09/15/23 for preop eval. Per note, - Patient is currently pending gallbladder surgery.  Denies chest pain, shortness of breath, palpitations, syncope/near syncope.  Has persistent lower extremity swelling which has been a chronic issue. 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  Walks outdoors regularly.  Able to go grocery shopping, climb up and down stairs without chest pain or shortness of breath. -Most recent echocardiogram from 03/2022 showed EF 60-65%, no wall motion abnormalities, normal RV systolic function, no significant valvular abnormalities.  Most recent ischemic evaluation was an exercise tolerance test in 03/2022 that was negative for ischemia - As patient is able to complete greater than 4 METS physical activity, no indication for further cardiac testing prior to surgery - Will send this note to requesting surgeon to serve as pre-op eval - Note, patient is on xarelto for history of PE. This is managed by her PCP. Recommendations on holding xarelto should come from managing provider. Patient voiced understanding and will reach  out to PCP.  She is on a Lovenox  bridge per hematology. Per notes, Stop Xarelto 8/4, take Lovenox  8/4, 8/5, 8/6, hold 8/7.  Other pertinent hx includes current smoker, NIDDM2, GERD on PPI, OSA, COPD on Stiolto and prn  albuterol .  Reports LD Mounjaro 12/04/23.  Preop labs reviewed, mild hypokalemia potassium 3.3, mild anemia Hgb 11.9, otherwise unremarkable. DM2 well controlled, A1c 5.7.  EKG 09/15/23: Sinus rhythm with Premature atrial complexes. Rate 81. Left anterior fascicular block. Minimal voltage criteria for LVH, may be normal variant ( Cornell product )  Exercise tolerance test 03/22/22:   Negative EKG stress test for ischemia.   Poor exercise capacity (3:56 min:s; 4.7 METS).   Normal HR/BP response to exercise.  TTE 03/22/22:  1. Left ventricular ejection fraction, by estimation, is 60 to 65%. The  left ventricle has normal function. The left ventricle has no regional  wall motion abnormalities. Left ventricular diastolic parameters are  indeterminate.   2. Right ventricular systolic function is normal. The right ventricular  size is normal. There is normal pulmonary artery systolic pressure.   3. Left atrial size was mildly dilated.   4. No evidence of mitral valve regurgitation.   5. The aortic valve is tricuspid. Aortic valve regurgitation is not  visualized.   Coronary CTA 05/13/21: IMPRESSION: 1. Coronary artery calcium score 386 Agatston units places the patient in the 94th percentile for age and gender, suggesting high risk for future cardiac events.   2. There was extensive plaque noted, but does not appear obstructive.   )         Anesthesia Quick Evaluation

## 2023-12-12 NOTE — Progress Notes (Signed)
 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 coronary CTA in 05/2021 that showed a coronary calcium score of 386 (94th percentile). There was extensive plaque noted, but it did not appear obstructive. Echocardiogram in 03/2022 showed EF 60-65%, no regional wall motion abnormalities, normal RV systolic function, normal pulmonary artery systolic pressure. ETT in 03/2022 was negative for ischemia, normal HR and BP response to exercise. Seen by Rollo Louder, PA-C on 09/15/23 for preop eval. Per note, - Patient is currently pending gallbladder surgery.  Denies chest pain, shortness of breath, palpitations, syncope/near syncope.  Has persistent lower extremity swelling which has been a chronic issue. 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  Walks outdoors regularly.  Able to go grocery shopping, climb up and down stairs without chest pain or shortness of breath. -Most recent echocardiogram from 03/2022 showed EF 60-65%, no wall motion abnormalities, normal RV systolic function, no significant valvular abnormalities.  Most recent ischemic evaluation was an exercise tolerance test in 03/2022 that was negative for ischemia - As patient is able to complete greater than 4 METS physical activity, no indication for further cardiac testing prior to surgery - Will send this note to requesting surgeon to serve as pre-op eval - Note, patient is on xarelto for history of PE. This is managed by her PCP. Recommendations on holding xarelto should come from managing provider. Patient voiced understanding and will reach out to PCP.  She is on a Lovenox  bridge per hematology. Per notes, Stop Xarelto 8/4, take Lovenox  8/4, 8/5, 8/6, hold 8/7.  Other pertinent hx includes current smoker, NIDDM2, GERD on PPI, OSA, COPD on Stiolto and prn albuterol .  Reports LD Mounjaro 12/04/23.  Preop labs  reviewed, mild hypokalemia potassium 3.3, mild anemia Hgb 11.9, otherwise unremarkable. DM2 well controlled, A1c 5.7.  EKG 09/15/23: Sinus rhythm with Premature atrial complexes. Rate 81. Left anterior fascicular block. Minimal voltage criteria for LVH, may be normal variant ( Cornell product )  Exercise tolerance test 03/22/22:   Negative EKG stress test for ischemia.   Poor exercise capacity (3:56 min:s; 4.7 METS).   Normal HR/BP response to exercise.  TTE 03/22/22:  1. Left ventricular ejection fraction, by estimation, is 60 to 65%. The  left ventricle has normal function. The left ventricle has no regional  wall motion abnormalities. Left ventricular diastolic parameters are  indeterminate.   2. Right ventricular systolic function is normal. The right ventricular  size is normal. There is normal pulmonary artery systolic pressure.   3. Left atrial size was mildly dilated.   4. No evidence of mitral valve regurgitation.   5. The aortic valve is tricuspid. Aortic valve regurgitation is not  visualized.   Coronary CTA 05/13/21: IMPRESSION: 1. Coronary artery calcium score 386 Agatston units places the patient in the 94th percentile for age and gender, suggesting high risk for future cardiac events.   2. There was extensive plaque noted, but does not appear obstructive.     Lynwood Geofm RIGGERS [facility] Short Stay [facility]/Anesthesiology Phone [phone] 12/12/2023 1:11 PM

## 2023-12-13 ENCOUNTER — Telehealth: Payer: Self-pay

## 2023-12-13 DIAGNOSIS — N1831 Chronic kidney disease, stage 3a: Secondary | ICD-10-CM | POA: Diagnosis not present

## 2023-12-13 DIAGNOSIS — K219 Gastro-esophageal reflux disease without esophagitis: Secondary | ICD-10-CM | POA: Diagnosis not present

## 2023-12-13 DIAGNOSIS — E782 Mixed hyperlipidemia: Secondary | ICD-10-CM | POA: Diagnosis not present

## 2023-12-13 DIAGNOSIS — I2699 Other pulmonary embolism without acute cor pulmonale: Secondary | ICD-10-CM | POA: Diagnosis not present

## 2023-12-13 DIAGNOSIS — E1142 Type 2 diabetes mellitus with diabetic polyneuropathy: Secondary | ICD-10-CM | POA: Diagnosis not present

## 2023-12-13 DIAGNOSIS — J4489 Other specified chronic obstructive pulmonary disease: Secondary | ICD-10-CM | POA: Diagnosis not present

## 2023-12-13 DIAGNOSIS — E1122 Type 2 diabetes mellitus with diabetic chronic kidney disease: Secondary | ICD-10-CM | POA: Diagnosis not present

## 2023-12-13 NOTE — Progress Notes (Signed)
   12/13/2023  Patient ID: [name], female   DOB: 27-May-1955, 68 y.o.   MRN: [id number]  Contacted patient regarding referral for medication management from Catalina Bare, MD .   Left patient a voicemail to return my call at their convenience  Heather Factor, PharmD Clinical Pharmacist  [phone]

## 2023-12-13 NOTE — Progress Notes (Signed)
   12/13/2023  Patient ID: [name], female   DOB: 01/17/1956, 68 y.o.   MRN: [id number]  Contacted patient regarding referral for medication management from Osei-Bonsu, Zachary, MD .   Spoke to patient this morning, but she was confused on it being a telephone encounter, and I have been experiencing technical difficulties with my computer. The patient is agreeable to reschedule the appointment for this afternoon. I will call the patient back after 2 pm today.  Heather Factor, PharmD Clinical Pharmacist  [phone]

## 2023-12-14 ENCOUNTER — Encounter (HOSPITAL_COMMUNITY)
Admission: RE | Disposition: A | Discharge status: Home / Self Care | Payer: Self-pay | Source: Home / Self Care | Attending: Surgery

## 2023-12-14 ENCOUNTER — Other Ambulatory Visit: Payer: Self-pay

## 2023-12-14 ENCOUNTER — Ambulatory Visit (HOSPITAL_COMMUNITY): Admitting: Physician Assistant

## 2023-12-14 ENCOUNTER — Ambulatory Visit (HOSPITAL_COMMUNITY)
Admission: RE | Admit: 2023-12-14 | Discharge: 2023-12-14 | Disposition: A | Discharge status: Home / Self Care | Attending: Surgery | Admitting: Surgery

## 2023-12-14 ENCOUNTER — Ambulatory Visit (HOSPITAL_COMMUNITY): Admitting: Certified Registered Nurse Anesthetist

## 2023-12-14 DIAGNOSIS — K449 Diaphragmatic hernia without obstruction or gangrene: Secondary | ICD-10-CM | POA: Insufficient documentation

## 2023-12-14 DIAGNOSIS — Z7985 Long-term (current) use of injectable non-insulin antidiabetic drugs: Secondary | ICD-10-CM | POA: Insufficient documentation

## 2023-12-14 DIAGNOSIS — I2510 Atherosclerotic heart disease of native coronary artery without angina pectoris: Secondary | ICD-10-CM | POA: Diagnosis not present

## 2023-12-14 DIAGNOSIS — K8010 Calculus of gallbladder with chronic cholecystitis without obstruction: Secondary | ICD-10-CM | POA: Insufficient documentation

## 2023-12-14 DIAGNOSIS — J449 Chronic obstructive pulmonary disease, unspecified: Secondary | ICD-10-CM | POA: Diagnosis not present

## 2023-12-14 DIAGNOSIS — E119 Type 2 diabetes mellitus without complications: Secondary | ICD-10-CM | POA: Insufficient documentation

## 2023-12-14 DIAGNOSIS — I10 Essential (primary) hypertension: Secondary | ICD-10-CM | POA: Diagnosis not present

## 2023-12-14 DIAGNOSIS — G4730 Sleep apnea, unspecified: Secondary | ICD-10-CM | POA: Insufficient documentation

## 2023-12-14 DIAGNOSIS — K219 Gastro-esophageal reflux disease without esophagitis: Secondary | ICD-10-CM | POA: Insufficient documentation

## 2023-12-14 DIAGNOSIS — K8020 Calculus of gallbladder without cholecystitis without obstruction: Secondary | ICD-10-CM | POA: Diagnosis not present

## 2023-12-14 DIAGNOSIS — Z01818 Encounter for other preprocedural examination: Secondary | ICD-10-CM

## 2023-12-14 HISTORY — PX: INDOCYANINE GREEN FLUORESCENCE IMAGING (ICG): SHX7595

## 2023-12-14 HISTORY — PX: CHOLECYSTECTOMY: SHX55

## 2023-12-14 LAB — GLUCOSE, CAPILLARY
Glucose-Capillary: 88 mg/dL (ref 70–99)
Glucose-Capillary: 130 mg/dL — ABNORMAL HIGH (ref 70–99)
Glucose-Capillary: 164 mg/dL — ABNORMAL HIGH (ref 70–99)

## 2023-12-14 SURGERY — LAPAROSCOPIC CHOLECYSTECTOMY
Anesthesia: General | Site: Abdomen

## 2023-12-14 MED ORDER — SODIUM CHLORIDE 0.9 % IR SOLN
Status: DC | PRN
  Administered 2023-12-14 (×2): 1000 mL

## 2023-12-14 MED ORDER — PROPOFOL 10 MG/ML IV BOLUS
INTRAVENOUS | Status: AC
  Filled 2023-12-14: qty 20

## 2023-12-14 MED ORDER — FENTANYL CITRATE (PF) 100 MCG/2ML IJ SOLN
INTRAMUSCULAR | Status: AC
Start: 2023-12-14 — End: 2023-12-14
  Filled 2023-12-14: qty 2

## 2023-12-14 MED ORDER — ONDANSETRON HCL 4 MG/2ML IJ SOLN
INTRAMUSCULAR | Status: AC
  Filled 2023-12-14: qty 2

## 2023-12-14 MED ORDER — CEFAZOLIN SODIUM-DEXTROSE 2-4 GM/100ML-% IV SOLN
2.0000 g | INTRAVENOUS | Status: AC
  Administered 2023-12-14: 2 g via INTRAVENOUS
  Filled 2023-12-14: qty 100

## 2023-12-14 MED ORDER — OXYCODONE HCL 5 MG/5ML PO SOLN: 5.0000 mg | Freq: Once | ORAL | Status: AC | PRN

## 2023-12-14 MED ORDER — OXYCODONE HCL 5 MG PO TABS
ORAL_TABLET | ORAL | Status: AC
  Filled 2023-12-14: qty 1

## 2023-12-14 MED ORDER — INSULIN ASPART 100 UNIT/ML IJ SOLN: 0.0000 [IU] | INTRAMUSCULAR | Status: DC | PRN

## 2023-12-14 MED ORDER — LIDOCAINE 2% (20 MG/ML) 5 ML SYRINGE
INTRAMUSCULAR | Status: AC
  Filled 2023-12-14: qty 5

## 2023-12-14 MED ORDER — STERILE WATER FOR INJECTION IJ SOLN
INTRAMUSCULAR | Status: DC | PRN
  Administered 2023-12-14: 1 mL via INTRAVENOUS

## 2023-12-14 MED ORDER — PHENYLEPHRINE 80 MCG/ML (10ML) SYRINGE FOR IV PUSH (FOR BLOOD PRESSURE SUPPORT)
PREFILLED_SYRINGE | INTRAVENOUS | Status: AC
  Filled 2023-12-14: qty 10

## 2023-12-14 MED ORDER — LACTATED RINGERS IV SOLN: INTRAVENOUS | Status: DC

## 2023-12-14 MED ORDER — CHLORHEXIDINE GLUCONATE CLOTH 2 % EX PADS: 6.0000 | MEDICATED_PAD | Freq: Once | CUTANEOUS | Status: DC

## 2023-12-14 MED ORDER — IPRATROPIUM-ALBUTEROL 0.5-2.5 (3) MG/3ML IN SOLN
RESPIRATORY_TRACT | Status: AC
  Filled 2023-12-14: qty 3

## 2023-12-14 MED ORDER — FENTANYL CITRATE (PF) 100 MCG/2ML IJ SOLN
INTRAMUSCULAR | Status: AC
  Filled 2023-12-14: qty 2

## 2023-12-14 MED ORDER — ROCURONIUM BROMIDE 10 MG/ML (PF) SYRINGE
PREFILLED_SYRINGE | INTRAVENOUS | Status: DC | PRN
  Administered 2023-12-14: 10 mg via INTRAVENOUS
  Administered 2023-12-14: 60 mg via INTRAVENOUS

## 2023-12-14 MED ORDER — ACETAMINOPHEN 500 MG PO TABS
1000.0000 mg | ORAL_TABLET | Freq: Once | ORAL | Status: AC
  Administered 2023-12-14: 1000 mg via ORAL
  Filled 2023-12-14: qty 2

## 2023-12-14 MED ORDER — DEXAMETHASONE SODIUM PHOSPHATE 10 MG/ML IJ SOLN
INTRAMUSCULAR | Status: DC | PRN
  Administered 2023-12-14: 5 mg via INTRAVENOUS

## 2023-12-14 MED ORDER — PROPOFOL 10 MG/ML IV BOLUS
INTRAVENOUS | Status: DC | PRN
  Administered 2023-12-14: 170 mg via INTRAVENOUS

## 2023-12-14 MED ORDER — ONDANSETRON HCL 4 MG/2ML IJ SOLN
INTRAMUSCULAR | Status: DC | PRN
  Administered 2023-12-14: 4 mg via INTRAVENOUS

## 2023-12-14 MED ORDER — FENTANYL CITRATE (PF) 250 MCG/5ML IJ SOLN
INTRAMUSCULAR | Status: AC
  Filled 2023-12-14: qty 5

## 2023-12-14 MED ORDER — CHLORHEXIDINE GLUCONATE 0.12 % MT SOLN
15.0000 mL | Freq: Once | OROMUCOSAL | Status: AC
  Administered 2023-12-14: 15 mL via OROMUCOSAL
  Filled 2023-12-14: qty 15

## 2023-12-14 MED ORDER — SUGAMMADEX SODIUM 200 MG/2ML IV SOLN
INTRAVENOUS | Status: DC | PRN
  Administered 2023-12-14: 200 mg via INTRAVENOUS

## 2023-12-14 MED ORDER — INDIGOTINDISULFONATE SODIUM 8 MG/ML IJ SOLN: INTRAMUSCULAR | Status: DC | PRN

## 2023-12-14 MED ORDER — HEMOSTATIC AGENTS (NO CHARGE) OPTIME
TOPICAL | Status: DC | PRN
  Administered 2023-12-14 (×3): 1 via TOPICAL

## 2023-12-14 MED ORDER — OXYCODONE HCL 5 MG PO TABS
5.0000 mg | ORAL_TABLET | Freq: Once | ORAL | Status: AC | PRN
  Administered 2023-12-14: 5 mg via ORAL

## 2023-12-14 MED ORDER — IPRATROPIUM-ALBUTEROL 0.5-2.5 (3) MG/3ML IN SOLN
3.0000 mL | RESPIRATORY_TRACT | Status: DC
  Administered 2023-12-14: 3 mL via RESPIRATORY_TRACT

## 2023-12-14 MED ORDER — ORAL CARE MOUTH RINSE: 15.0000 mL | Freq: Once | OROMUCOSAL | Status: AC

## 2023-12-14 MED ORDER — FENTANYL CITRATE (PF) 100 MCG/2ML IJ SOLN
25.0000 ug | INTRAMUSCULAR | Status: DC | PRN
  Administered 2023-12-14 (×3): 50 ug via INTRAVENOUS

## 2023-12-14 MED ORDER — PHENYLEPHRINE 80 MCG/ML (10ML) SYRINGE FOR IV PUSH (FOR BLOOD PRESSURE SUPPORT)
PREFILLED_SYRINGE | INTRAVENOUS | Status: DC | PRN
  Administered 2023-12-14: 80 ug via INTRAVENOUS

## 2023-12-14 MED ORDER — ROCURONIUM BROMIDE 10 MG/ML (PF) SYRINGE
PREFILLED_SYRINGE | INTRAVENOUS | Status: AC
  Filled 2023-12-14: qty 10

## 2023-12-14 MED ORDER — OXYCODONE HCL 5 MG PO TABS: 5.0000 mg | ORAL_TABLET | Freq: Four times a day (QID) | ORAL | 0 refills | Status: DC | PRN

## 2023-12-14 MED ORDER — 0.9 % SODIUM CHLORIDE (POUR BTL) OPTIME
TOPICAL | Status: DC | PRN
  Administered 2023-12-14: 1000 mL

## 2023-12-14 MED ORDER — LIDOCAINE 2% (20 MG/ML) 5 ML SYRINGE
INTRAMUSCULAR | Status: DC | PRN
  Administered 2023-12-14: 100 mg via INTRAVENOUS

## 2023-12-14 MED ORDER — BUPIVACAINE-EPINEPHRINE (PF) 0.25% -1:200000 IJ SOLN
INTRAMUSCULAR | Status: AC
Start: 2023-12-14 — End: 2023-12-14
  Filled 2023-12-14: qty 30

## 2023-12-14 MED ORDER — BUPIVACAINE-EPINEPHRINE 0.25% -1:200000 IJ SOLN
INTRAMUSCULAR | Status: DC | PRN
Start: 2023-12-14 — End: 2023-12-14
  Administered 2023-12-14: 10 mL

## 2023-12-14 MED ORDER — FENTANYL CITRATE (PF) 250 MCG/5ML IJ SOLN
INTRAMUSCULAR | Status: DC | PRN
  Administered 2023-12-14 (×4): 50 ug via INTRAVENOUS

## 2023-12-14 MED ORDER — MIDAZOLAM HCL 2 MG/2ML IJ SOLN
INTRAMUSCULAR | Status: AC
  Filled 2023-12-14: qty 2

## 2023-12-14 MED ORDER — DEXAMETHASONE SODIUM PHOSPHATE 10 MG/ML IJ SOLN
INTRAMUSCULAR | Status: AC
  Filled 2023-12-14: qty 1

## 2023-12-14 MED ORDER — DROPERIDOL 2.5 MG/ML IJ SOLN: 0.6250 mg | Freq: Once | INTRAMUSCULAR | Status: DC | PRN

## 2023-12-14 SURGICAL SUPPLY — 33 items
BAG COUNTER SPONGE SURGICOUNT (BAG) ×3 IMPLANT
BLADE CLIPPER SURG (BLADE) IMPLANT
CANISTER SUCTION 3000ML PPV (SUCTIONS) ×3 IMPLANT
CHLORAPREP W/TINT 26 (MISCELLANEOUS) ×3 IMPLANT
CLIP APPLIE ROT 10 11.4 M/L (STAPLE) ×3 IMPLANT
COVER SURGICAL LIGHT HANDLE (MISCELLANEOUS) ×3 IMPLANT
DERMABOND ADVANCED .7 DNX12 (GAUZE/BANDAGES/DRESSINGS) ×3 IMPLANT
ELECTRODE REM PT RTRN 9FT ADLT (ELECTROSURGICAL) ×3 IMPLANT
GLOVE BIO SURGEON STRL SZ8 (GLOVE) ×3 IMPLANT
GLOVE BIOGEL PI IND STRL 8 (GLOVE) ×3 IMPLANT
GOWN STRL REUS W/ TWL LRG LVL3 (GOWN DISPOSABLE) ×6 IMPLANT
GOWN STRL REUS W/ TWL XL LVL3 (GOWN DISPOSABLE) ×3 IMPLANT
HEMOSTAT SNOW SURGICEL 2X4 (HEMOSTASIS) IMPLANT
IRRIGATION SUCT STRKRFLW 2 WTP (MISCELLANEOUS) ×3 IMPLANT
KIT BASIN OR (CUSTOM PROCEDURE TRAY) ×3 IMPLANT
KIT IMAGING PINPOINTPAQ (MISCELLANEOUS) IMPLANT
KIT TURNOVER KIT B (KITS) ×3 IMPLANT
NS IRRIG 1000ML POUR BTL (IV SOLUTION) ×3 IMPLANT
PAD ARMBOARD POSITIONER FOAM (MISCELLANEOUS) ×3 IMPLANT
POUCH RETRIEVAL ECOSAC 10 (ENDOMECHANICALS) ×3 IMPLANT
SCISSORS LAP 5X35 DISP (ENDOMECHANICALS) ×3 IMPLANT
SET TUBE SMOKE EVAC HIGH FLOW (TUBING) ×3 IMPLANT
SLEEVE Z-THREAD 5X100MM (TROCAR) ×3 IMPLANT
SPECIMEN JAR SMALL (MISCELLANEOUS) ×3 IMPLANT
SUT MNCRL AB 4-0 PS2 18 (SUTURE) ×3 IMPLANT
SUT VICRYL 0 UR6 27IN ABS (SUTURE) IMPLANT
TOWEL GREEN STERILE (TOWEL DISPOSABLE) ×3 IMPLANT
TRAY LAPAROSCOPIC MC (CUSTOM PROCEDURE TRAY) ×3 IMPLANT
TROCAR 11X100 Z THREAD (TROCAR) ×3 IMPLANT
TROCAR BALLN 12MMX100 BLUNT (TROCAR) ×3 IMPLANT
TROCAR Z-THREAD OPTICAL 5X100M (TROCAR) ×3 IMPLANT
WARMER LAPAROSCOPE (MISCELLANEOUS) ×3 IMPLANT
WATER STERILE IRR 1000ML POUR (IV SOLUTION) ×3 IMPLANT

## 2023-12-14 NOTE — Transfer of Care (Signed)
 Immediate Anesthesia Transfer of Care Note  Patient: [name]  Procedure(s) Performed: LAPAROSCOPIC CHOLECYSTECTOMY INDOCYANINE GREEN  FLUORESCENCE IMAGING (ICG) (Abdomen)  Patient Location: PACU  Anesthesia Type:General  Level of Consciousness: awake, alert , oriented, and patient cooperative  Airway & Oxygen  Therapy: Patient Spontanous Breathing  Post-op Assessment: Report given to RN, Post -op Vital signs reviewed and stable, and Patient moving all extremities X 4  Post vital signs: Reviewed and stable  Last Vitals:  Vitals Value Taken Time  BP 137/66 12/14/23 09:31  Temp    Pulse 79 12/14/23 09:34  Resp 16 12/14/23 09:34  SpO2 95 % 12/14/23 09:34  Vitals shown include unfiled device data.  Last Pain:  Vitals:   12/14/23 0626  TempSrc:   PainSc: 0-No pain      Patients Stated Pain Goal: 3 (12/11/23 1003)  Complications: No notable events documented.

## 2023-12-14 NOTE — Anesthesia Postprocedure Evaluation (Signed)
 Anesthesia Post Note  Patient: [name]  Procedure(s) Performed: LAPAROSCOPIC CHOLECYSTECTOMY INDOCYANINE GREEN  FLUORESCENCE IMAGING (ICG) (Abdomen)     Patient location during evaluation: PACU Anesthesia Type: General Level of consciousness: awake and alert Pain management: pain level controlled Vital Signs Assessment: post-procedure vital signs reviewed and stable Respiratory status: spontaneous breathing, nonlabored ventilation and respiratory function stable Cardiovascular status: blood pressure returned to baseline Postop Assessment: no apparent nausea or vomiting Anesthetic complications: no Comments: Intitially c/o SOB shortly after arrival in PACU. SOB resolved after additional analgesia and Duoneb. Lawence, MD   No notable events documented.  Last Vitals:  Vitals:   12/14/23 1000 12/14/23 1015  BP: 127/67 (!) 126/56  Pulse: 76 73  Resp: 14 16  Temp: (!) 36.4 C   SpO2: 100% 95%    Last Pain:  Vitals:   12/14/23 1008  TempSrc:   PainSc: 4                  [name]

## 2023-12-14 NOTE — Discharge Instructions (Signed)
 [facility] ______[facility] LAPAROSCOPIC SURGERY: POST OP INSTRUCTIONS Always review your discharge instruction sheet given to you by the facility where your surgery was performed. IF YOU HAVE DISABILITY OR FAMILY LEAVE FORMS, YOU MUST BRING THEM TO THE OFFICE FOR PROCESSING.   DO NOT GIVE THEM TO YOUR DOCTOR.  A prescription for pain medication may be given to you upon discharge.  Take your pain medication as prescribed, if needed.  If narcotic pain medicine is not needed, then you may take acetaminophen (Tylenol) or ibuprofen (Advil) as needed. Take your usually prescribed medications unless otherwise directed. If you need a refill on your pain medication, please contact your pharmacy.  They will contact our office to request authorization. Prescriptions will not be filled after 5pm or on week-ends. You should follow a light diet the first few days after arrival home, such as soup and crackers, etc.  Be sure to include lots of fluids daily. Most patients will experience some swelling and bruising in the area of the incisions.  Ice packs will help.  Swelling and bruising can take several days to resolve.  It is common to experience some constipation if taking pain medication after surgery.  Increasing fluid intake and taking a stool softener (such as Colace) will usually help or prevent this problem from occurring.  A mild laxative (Milk of Magnesia or Miralax) should be taken according to package instructions if there are no bowel movements after 48 hours. Unless discharge instructions indicate otherwise, you may remove your bandages 24-48 hours after surgery, and you may shower at that time.  You may have steri-strips (small skin tapes) in place directly over the incision.  These strips should be left on the skin for 7-10 days.  If your surgeon used skin glue on the incision, you may shower in 24 hours.  The glue will flake off over the next 2-3 weeks.  Any sutures or staples will be  removed at the office during your follow-up visit. ACTIVITIES:  You may resume regular (light) daily activities beginning the next day--such as daily self-care, walking, climbing stairs--gradually increasing activities as tolerated.  You may have sexual intercourse when it is comfortable.  Refrain from any heavy lifting or straining until approved by your doctor. You may drive when you are no longer taking prescription pain medication, you can comfortably wear a seatbelt, and you can safely maneuver your car and apply brakes. RETURN TO WORK:  __________________________________________________________ [name] should see your doctor in the office for a follow-up appointment approximately 2-3 weeks after your surgery.  Make sure that you call for this appointment within a day or two after you arrive home to insure a convenient appointment time. OTHER INSTRUCTIONS: __________________________________________________________________________________________________________________________ __________________________________________________________________________________________________________________________ WHEN TO CALL YOUR DOCTOR: Fever over 101.0 Inability to urinate Continued bleeding from incision. Increased pain, redness, or drainage from the incision. Increasing abdominal pain  The clinic staff is available to answer your questions during regular business hours.  Please don't hesitate to call and ask to speak to one of the nurses for clinical concerns.  If you have a medical emergency, go to the nearest emergency room or call 911.  A surgeon from [facility] is always on call at the hospital. [address], [address], [place], [place]  [address] ? [address] [address], [place], [place]   [address] [phone] ? [phone] ? FAX [phone] Web site: [email or web address]

## 2023-12-14 NOTE — Interval H&P Note (Signed)
 History and Physical Interval Note:  12/14/2023 7:14 AM  Halina ONEIDA Pouch  has presented today for surgery, with the diagnosis of GALLSTONES.  The various methods of treatment have been discussed with the patient and family. After consideration of risks, benefits and other options for treatment, the patient has consented to  Procedure(s) with comments: LAPAROSCOPIC CHOLECYSTECTOMY (N/A) - WITH ICG DYE as a surgical intervention.  The patient's history has been reviewed, patient examined, no change in status, stable for surgery.  I have reviewed the patient's chart and labs.  Questions were answered to the patient's satisfaction.    The procedure has been discussed with the patient. Operative and non operative treatments have been discussed. Risks of surgery include bleeding, infection,  Common bile duct injury,  Injury to the stomach,liver, colon,small intestine, abdominal wall,  Diaphragm,  Major blood vessels,  And the need for an open procedure.  Other risks include worsening of medical problems, death,  DVT and pulmonary embolism, and cardiovascular events.   Medical options have also been discussed. The patient has been informed of long term expectations of surgery and non surgical options,  The patient agrees to proceed.    Kani Jobson A Ashaunte Standley

## 2023-12-14 NOTE — Op Note (Addendum)
 Preoperative diagnosis: Symptomatic cholelithiasis  Postoperative diagnosis: Same  Procedure laparoscopic cholecystectomy with ICG dye fluorescence  Surgeon: Debby Shipper, MD  Assistant: Dr. Eva Barrier, MD  I was personally present during the key and critical portions of this procedure and immediately available throughout the entire procedure, as documented in my operative note.   Anesthesia: General With 0.25% Marcaine  with epinephrine   EBL: Approximately 30 cc  Drains: None  Specimen: Gallbladder to pathology  Indications for procedure: The patient is a 68 year old female with symptomatic cholelithiasis.  She has had multiple attacks.  She presents for laparoscopic cholecystectomy after workup showed gallstones.The procedure has been discussed with the patient. Operative and non operative treatments have been discussed. Risks of surgery include bleeding, infection,  Common bile duct injury,  Injury to the stomach,liver, colon,small intestine, abdominal wall,  Diaphragm,  Major blood vessels,  And the need for an open procedure.  Other risks include worsening of medical problems, death,  DVT and pulmonary embolism, and cardiovascular events.   Medical options have also been discussed. The patient has been informed of long term expectations of surgery and non surgical options,  The patient agrees to proceed.       Description of procedure: The patient was met in the holding area and all questions were answered.  She was taken back to the operating room placed supine upon the operating room table.  After induction of general anesthesia, the abdomen was prepped and draped in a sterile fashion and a timeout was performed.  Proper patient, site and procedure were verified and she received appropriate preoperative antibiotics.  Of note she received ICG in the holding area.  A 1 cm infraumbilical incision was made.  Dissection was carried down and  the fascia was identified and opened in the  midline with a scalpel blade.  A pursestring suture of 0 Vicryl was placed and a 12 mm balloontipped cannula was placed under direct vision.  The balloon was insufflated.  CO2 insufflation to 15 mmHg pressure was performed.  Laparoscopy was performed.  She was placed in reverse Trendelenburg and rolled her left.  Her initial laparoscopy showed no significant intra-abdominal pathology.  The gallbladder was identified.  An 11 mm subxiphoid port was placed under direct vision as well as two 5 mm ports in the right upper quadrant.  The gallbladder was grabbed by its dome and retracted over the liver edge.  A second grasper was used to grab the infundibulum and pulled   it to the right lower quadrant.  Dissection was begun anterior to the cystic artery.  We were able to dissect out the cystic duct circumferentially.  A complete 360 degree  critical view of the cystic duct was obtained.  Using ICG dye fluorescence, I was able to trace the cystic duct down to the common bile duct.  The common bile duct was visualized both proximal and distal to the insertion of the cystic duct.  Our window was up by the gallbladder away from this.  The cystic duct was extremely small and I did not feel would accommodate a cholangiogram catheter. With the use of the ICG dye,  the anatomy  was well-defined and I could see all pertinent structures.  The cystic duct was divided between clips. .  The cystic artery was visualized dissected out and divided between clips as well.  We then used the cautery to dissect the gallbladder from the gallbladder bed.  Her tissue was quite thin and flimsy.  There was some  leakage of bile from the gallbladder body itself.  No stones though were leaked.  The remainder of the gallbladder was dissected out  of the gallbladder bed.  It was placed into a Eco pouch.  Copious irrigation was used to clear the bile and check for any bleeding.  There was some oozing from the gallbladder bed controlled with Surgicel  snow.  This did a nice job of completing hemostasis.  This was inspected for 5 minutes and I saw no further bleeding.  I reexamined the abdominal cavity in the office saw no evidence of bile leakage, bleeding or injury to internal viscera.  Any excess irrigation was suctioned out.  We then extracted the gallbladder through the umbilical port site and passed it off the field.  Closed the fascia with 0 Vicryl.  Reexamination of the abdominal cavity showed no evidence of injury nor evidence of bile leakage or bleeding at that time.  Any excess irrigation was suctioned out.  Then we removed all of her ports and the CO2 to escape.  The skin was closed with 4 Monocryl.  Dermabond was applied.  All counts found to be correct.  The patient was awoke extubated taken to recovery in satisfactory condition.

## 2023-12-14 NOTE — H&P (Signed)
 History of Present Illness: [name] is a 68 y.o. female who is seen today for evaluation of gallstones. Patient's complaint of chronic right upper quadrant pain for about a year. She underwent ultrasound after workup with endoscopy and found to have gallstones. She also was found to have a hiatal hernia. The pain is worse after greasy meals. It comes and goes..    Review of Systems: A complete review of systems was obtained from the patient. I have reviewed this information and discussed as appropriate with the patient. See HPI as well for other ROS.    Medical History: Past Medical History:  Diagnosis Date  Anemia  Anxiety  Asthma, unspecified asthma severity, unspecified whether complicated, unspecified whether persistent (HHS-HCC)  COPD (chronic obstructive pulmonary disease) (CMS/HHS-HCC)  Diabetes mellitus without complication (CMS/HHS-HCC)  GERD (gastroesophageal reflux disease)  Hyperlipidemia  Hypertension  Sleep apnea   There is no problem list on file for this patient.  Past Surgical History:  Procedure Laterality Date  BREAST BIOPSY Left 08/23/2022  HYSTERECTOMY  KNEE SURGERY Bilateral    Allergies  Allergen Reactions  Codeine Other (See Comments)  REACTION: itch  REACTION: itching  Tramadol-Acetaminophen  Itching  REACTION: itch   Current Outpatient Medications on File Prior to Visit  Medication Sig Dispense Refill  albuterol  MDI, PROVENTIL , VENTOLIN , PROAIR , HFA 90 mcg/actuation inhaler 2 puffs every 4 (four) hours as needed for wheezing or shortness of breath.  amLODIPine (NORVASC) 10 MG tablet Take 10 mg by mouth once daily  diclofenac (VOLTAREN) 75 MG EC tablet 1 TAB(S) ORALLY ONCE A DAY WHEN NEEDED 30 DAYS  DULoxetine (CYMBALTA) 60 MG DR capsule Take 120 mg by mouth once daily  esomeprazole (NEXIUM) 40 MG DR capsule Take 40 mg by mouth once daily  FUROsemide (LASIX) 40 MG tablet Take 40 mg by mouth once daily  hydrALAZINE (APRESOLINE) 25 MG  tablet  ipratropium-albuteroL  (COMBIVENT RESPIMAT) 20-100 mcg/actuation inhaler 1 puff Inhalation Four times a day  KLOR-CON M10 10 mEq ER tablet Take 10 mEq by mouth once daily  losartan (COZAAR) 100 MG tablet Take 100 mg by mouth once daily  metoclopramide  (REGLAN ) 10 MG tablet Take by mouth  metoprolol  succinate (TOPROL -XL) 25 MG XL tablet Take 25 mg by mouth once daily  MOUNJARO 10 mg/0.5 mL pen injector INJECT 12.5 MG SUBCUTANEOUSLY ONCE A WEEK  nitroGLYcerin  (NITROSTAT ) 0.4 MG SL tablet PLACE 1 TABLET UNDER THE TONGUE EVERY 5 MINUTES AS NEEDED.  rosuvastatin (CRESTOR) 20 MG tablet 1 tablet Orally Once a day  XARELTO 20 mg tablet Xarelto 20 mg tablet  isosorbide  mononitrate (IMDUR ) 60 MG ER tablet Take 60 mg by mouth   No current facility-administered medications on file prior to visit.   History reviewed. No pertinent family history.   Social History   Tobacco Use  Smoking Status Never  Smokeless Tobacco Never    Social History   Socioeconomic History  Marital status: Single  Tobacco Use  Smoking status: Never  Smokeless tobacco: Never  Substance and Sexual Activity  Alcohol use: Yes  Drug use: Never   Social Drivers of Health   Food Insecurity: Food Insecurity Present (09/13/2022)  Received from [facility]  Hunger Vital Sign  Worried About Running Out of Food in the Last Year: Never true  Ran Out of Food in the Last Year: Sometimes true  Transportation Needs: No Transportation Needs (09/13/2022)  Received from [facility] - Transportation  Lack of Transportation (Medical): No  Lack of Transportation (Non-Medical): No  Objective:   Vitals:  08/28/23 1520  PainSc: 8   There is no height or weight on file to calculate BMI.  Physical Exam Cardiovascular:  Rate and Rhythm: Normal rate.  Pulmonary:  Effort: Pulmonary effort is normal.  Abdominal:  General: Abdomen is flat.  Palpations: Abdomen is soft.  Tenderness: There is no abdominal  tenderness.  Neurological:  General: No focal deficit present.  Mental Status: She is alert.  Psychiatric:  Mood and Affect: Mood normal.     Labs, Imaging and Diagnostic Testing:  Ultrasound shows gallstones.  CLINICAL DATA: Right upper quadrant abdomen pain for 1 year.   EXAM:  ULTRASOUND ABDOMEN LIMITED RIGHT UPPER QUADRANT   COMPARISON: None Available.   FINDINGS:  Gallbladder:   Gallstones are noted, largest measures 0.7 cm. No wall thickening  visualized. No sonographic Murphy sign noted by sonographer.   Common bile duct:   Diameter: 5 mm.   Liver:   No focal lesion identified. Mild heterogeneous increased  echotexture. Portal vein is patent on color Doppler imaging with  normal direction of blood flow towards the liver.   Other: None.   IMPRESSION:  1. Cholelithiasis without sonographic evidence of acute  cholecystitis.  2. Mild heterogeneous increased echotexture of the liver,  nonspecific but can be seen in fatty infiltration of liver.   Electronically Signed  By: Craig Farr M.D.  On: 08/02/2023 10:47  Assessment and Plan:   Diagnoses and all orders for this visit:  Gallstones    Recommend laparoscopic cholecystectomy. Risks and benefits reviewed. Risk of bleeding, infection, common bile duct injury, open surgery, injury to neighboring organs, exacerbation of underlying medical problems reviewed.  No follow-ups on file.  DEBBY CURTISTINE SHIPPER, MD

## 2023-12-14 NOTE — Anesthesia Procedure Notes (Signed)
 Procedure Name: Intubation Date/Time: 12/14/2023 7:54 AM  Performed by: Claudene Arlin LABOR, CRNAPre-anesthesia Checklist: Patient identified, Emergency Drugs available, Suction available and Patient being monitored Patient Re-evaluated:Patient Re-evaluated prior to induction Oxygen  Delivery Method: Circle system utilized Preoxygenation: Pre-oxygenation with 100% oxygen  Induction Type: IV induction Ventilation: Mask ventilation without difficulty and Oral airway inserted - appropriate to patient size Laryngoscope Size: 3 and Miller Grade View: Grade I Tube type: Oral Tube size: 7.0 mm Number of attempts: 1 Airway Equipment and Method: Stylet and Oral airway Placement Confirmation: ETT inserted through vocal cords under direct vision, positive ETCO2 and breath sounds checked- equal and bilateral Secured at: 22 cm Tube secured with: Tape Dental Injury: Teeth and Oropharynx as per pre-operative assessment

## 2023-12-15 ENCOUNTER — Encounter (HOSPITAL_COMMUNITY): Payer: Self-pay | Admitting: Surgery

## 2023-12-16 LAB — SURGICAL PATHOLOGY

## 2023-12-19 NOTE — Progress Notes (Signed)
   12/19/2023  Patient ID: [name], female   DOB: 02-Jul-1955, 68 y.o.   MRN: [id number]  Contacted patient regarding referral for medication management from Catalina Bare, MD .   Appointment re-scheduled for 12/19/23 at 1:30pm.   Heather Factor, PharmD Clinical Pharmacist  [phone]

## 2023-12-20 NOTE — Progress Notes (Signed)
   12/20/23  Patient ID: Halina ONEIDA Pouch, female   DOB: 1955-12-03, 68 y.o.   MRN: [id number]  Attempted to contact patient for scheduled appointment for medication management. Left HIPAA compliant message for patient to return my call at their convenience.   Heather Factor, PharmD Clinical Pharmacist  [phone]

## 2023-12-20 NOTE — Progress Notes (Signed)
   12/19/23  Patient ID: [name] Pouch, female   DOB: 12/28/1955, 68 y.o.   MRN: [id number]  Contacted patient regarding referral for medication management from Catalina Bare, MD .   Appointment re-scheduled for 12/20/23 at 1:30 pm.  Heather Factor, PharmD Clinical Pharmacist  [phone]

## 2024-01-02 ENCOUNTER — Telehealth: Payer: Self-pay

## 2024-01-02 NOTE — Progress Notes (Signed)
   01/02/2024  Patient ID: [name], female   DOB: 02/21/1956, 68 y.o.   MRN: [id number]  Contacted patient regarding referral for medication management from Osei-Bonsu, Zachary, MD .   The patient has scheduled multiple appointments with me, but we have been unable to complete any of them. I am outreaching as a final attempt to have a pharmacy visit.   I spoke with the patient today, but she was at work. She requests to call me back so that she can re-schedule our appointment. I will give her a few days to return my call.  Heather Factor, PharmD Clinical Pharmacist  [phone]

## 2024-01-11 DIAGNOSIS — G8929 Other chronic pain: Secondary | ICD-10-CM | POA: Diagnosis not present

## 2024-01-11 DIAGNOSIS — M5432 Sciatica, left side: Secondary | ICD-10-CM | POA: Diagnosis not present

## 2024-01-11 DIAGNOSIS — G894 Chronic pain syndrome: Secondary | ICD-10-CM | POA: Diagnosis not present

## 2024-01-11 DIAGNOSIS — M25521 Pain in right elbow: Secondary | ICD-10-CM | POA: Diagnosis not present

## 2024-01-11 DIAGNOSIS — M7072 Other bursitis of hip, left hip: Secondary | ICD-10-CM | POA: Diagnosis not present

## 2024-01-11 DIAGNOSIS — N1832 Chronic kidney disease, stage 3b: Secondary | ICD-10-CM | POA: Diagnosis not present

## 2024-01-16 NOTE — Progress Notes (Signed)
   01/16/2024  Patient ID: [name] Pouch, female   DOB: 12-07-1955, 68 y.o.   MRN: [id number]  Contacted patient regarding referral for medication management from Catalina Bare, MD .   I was unable to leave a voicemail, as her mailbox is full.   A third unsuccesful telephone outreach was attempted today to contact the patient who was referred to the pharmacy for assistance with medication management. The Population Health team is pleased to engage with this patient at any time in the future upon receipt of referral and should she be interested in assistance from the [facility] team.   Heather Factor, PharmD Clinical Pharmacist  [phone]

## 2024-01-18 DIAGNOSIS — R195 Other fecal abnormalities: Secondary | ICD-10-CM | POA: Diagnosis not present

## 2024-01-18 DIAGNOSIS — R197 Diarrhea, unspecified: Secondary | ICD-10-CM | POA: Diagnosis not present

## 2024-01-18 DIAGNOSIS — Z79899 Other long term (current) drug therapy: Secondary | ICD-10-CM | POA: Diagnosis not present

## 2024-01-18 DIAGNOSIS — E119 Type 2 diabetes mellitus without complications: Secondary | ICD-10-CM | POA: Diagnosis not present

## 2024-02-09 DIAGNOSIS — Z1211 Encounter for screening for malignant neoplasm of colon: Secondary | ICD-10-CM | POA: Diagnosis not present

## 2024-02-09 DIAGNOSIS — R197 Diarrhea, unspecified: Secondary | ICD-10-CM | POA: Diagnosis not present

## 2024-02-09 DIAGNOSIS — E119 Type 2 diabetes mellitus without complications: Secondary | ICD-10-CM | POA: Diagnosis not present

## 2024-02-09 DIAGNOSIS — K529 Noninfective gastroenteritis and colitis, unspecified: Secondary | ICD-10-CM | POA: Diagnosis not present

## 2024-02-22 DIAGNOSIS — N1832 Chronic kidney disease, stage 3b: Secondary | ICD-10-CM | POA: Diagnosis not present

## 2024-02-22 DIAGNOSIS — M25521 Pain in right elbow: Secondary | ICD-10-CM | POA: Diagnosis not present

## 2024-02-22 DIAGNOSIS — G894 Chronic pain syndrome: Secondary | ICD-10-CM | POA: Diagnosis not present

## 2024-02-22 DIAGNOSIS — M5432 Sciatica, left side: Secondary | ICD-10-CM | POA: Diagnosis not present

## 2024-02-22 DIAGNOSIS — M545 Low back pain, unspecified: Secondary | ICD-10-CM | POA: Diagnosis not present

## 2024-02-22 DIAGNOSIS — Z79899 Other long term (current) drug therapy: Secondary | ICD-10-CM | POA: Diagnosis not present

## 2024-05-27 ENCOUNTER — Telehealth: Payer: Self-pay | Admitting: Hematology and Oncology

## 2024-05-27 NOTE — Telephone Encounter (Signed)
 I spoke with patient as she called in to reschedule MD appointment from 05/31/2024 to 05/29/2024. Patient aware of date/time.

## 2024-05-29 ENCOUNTER — Other Ambulatory Visit: Payer: Self-pay

## 2024-05-29 ENCOUNTER — Emergency Department (HOSPITAL_COMMUNITY)

## 2024-05-29 ENCOUNTER — Inpatient Hospital Stay: Attending: Hematology and Oncology | Admitting: Hematology and Oncology

## 2024-05-29 ENCOUNTER — Inpatient Hospital Stay (HOSPITAL_COMMUNITY)
Admission: EM | Admit: 2024-05-29 | Discharge: 2024-06-04 | DRG: 481 | Disposition: A | Discharge status: Skilled Nursing Facility | Attending: Internal Medicine | Admitting: Internal Medicine

## 2024-05-29 ENCOUNTER — Encounter (HOSPITAL_COMMUNITY): Payer: Self-pay

## 2024-05-29 VITALS — BP 138/64 | HR 84 | Temp 98.1°F | Resp 17 | Wt 185.0 lb

## 2024-05-29 DIAGNOSIS — Z7901 Long term (current) use of anticoagulants: Secondary | ICD-10-CM

## 2024-05-29 DIAGNOSIS — S72441A Displaced fracture of lower epiphysis (separation) of right femur, initial encounter for closed fracture: Principal | ICD-10-CM

## 2024-05-29 DIAGNOSIS — Z853 Personal history of malignant neoplasm of breast: Secondary | ICD-10-CM

## 2024-05-29 DIAGNOSIS — Y92531 Health care provider office as the place of occurrence of the external cause: Secondary | ICD-10-CM

## 2024-05-29 DIAGNOSIS — K8689 Other specified diseases of pancreas: Secondary | ICD-10-CM | POA: Diagnosis present

## 2024-05-29 DIAGNOSIS — J439 Emphysema, unspecified: Secondary | ICD-10-CM | POA: Diagnosis present

## 2024-05-29 DIAGNOSIS — M1990 Unspecified osteoarthritis, unspecified site: Secondary | ICD-10-CM | POA: Diagnosis present

## 2024-05-29 DIAGNOSIS — Z8249 Family history of ischemic heart disease and other diseases of the circulatory system: Secondary | ICD-10-CM

## 2024-05-29 DIAGNOSIS — J4489 Other specified chronic obstructive pulmonary disease: Secondary | ICD-10-CM | POA: Diagnosis present

## 2024-05-29 DIAGNOSIS — E1110 Type 2 diabetes mellitus with ketoacidosis without coma: Secondary | ICD-10-CM | POA: Insufficient documentation

## 2024-05-29 DIAGNOSIS — I10 Essential (primary) hypertension: Secondary | ICD-10-CM | POA: Diagnosis present

## 2024-05-29 DIAGNOSIS — Z7984 Long term (current) use of oral hypoglycemic drugs: Secondary | ICD-10-CM

## 2024-05-29 DIAGNOSIS — Z833 Family history of diabetes mellitus: Secondary | ICD-10-CM

## 2024-05-29 DIAGNOSIS — Z86718 Personal history of other venous thrombosis and embolism: Secondary | ICD-10-CM

## 2024-05-29 DIAGNOSIS — Z90710 Acquired absence of both cervix and uterus: Secondary | ICD-10-CM

## 2024-05-29 DIAGNOSIS — M9711XA Periprosthetic fracture around internal prosthetic right knee joint, initial encounter: Secondary | ICD-10-CM | POA: Diagnosis present

## 2024-05-29 DIAGNOSIS — Z8051 Family history of malignant neoplasm of kidney: Secondary | ICD-10-CM

## 2024-05-29 DIAGNOSIS — Z96652 Presence of left artificial knee joint: Secondary | ICD-10-CM | POA: Diagnosis present

## 2024-05-29 DIAGNOSIS — W19XXXA Unspecified fall, initial encounter: Secondary | ICD-10-CM | POA: Insufficient documentation

## 2024-05-29 DIAGNOSIS — Z923 Personal history of irradiation: Secondary | ICD-10-CM

## 2024-05-29 DIAGNOSIS — D0512 Intraductal carcinoma in situ of left breast: Secondary | ICD-10-CM

## 2024-05-29 DIAGNOSIS — I2510 Atherosclerotic heart disease of native coronary artery without angina pectoris: Secondary | ICD-10-CM | POA: Diagnosis present

## 2024-05-29 DIAGNOSIS — E119 Type 2 diabetes mellitus without complications: Secondary | ICD-10-CM | POA: Diagnosis present

## 2024-05-29 DIAGNOSIS — Z885 Allergy status to narcotic agent status: Secondary | ICD-10-CM

## 2024-05-29 DIAGNOSIS — Z8041 Family history of malignant neoplasm of ovary: Secondary | ICD-10-CM

## 2024-05-29 DIAGNOSIS — D62 Acute posthemorrhagic anemia: Secondary | ICD-10-CM | POA: Diagnosis not present

## 2024-05-29 DIAGNOSIS — Z86711 Personal history of pulmonary embolism: Secondary | ICD-10-CM

## 2024-05-29 DIAGNOSIS — E785 Hyperlipidemia, unspecified: Secondary | ICD-10-CM | POA: Diagnosis present

## 2024-05-29 DIAGNOSIS — S7290XA Unspecified fracture of unspecified femur, initial encounter for closed fracture: Secondary | ICD-10-CM | POA: Diagnosis present

## 2024-05-29 DIAGNOSIS — F17210 Nicotine dependence, cigarettes, uncomplicated: Secondary | ICD-10-CM | POA: Diagnosis present

## 2024-05-29 DIAGNOSIS — Z79899 Other long term (current) drug therapy: Secondary | ICD-10-CM

## 2024-05-29 DIAGNOSIS — D649 Anemia, unspecified: Secondary | ICD-10-CM | POA: Insufficient documentation

## 2024-05-29 DIAGNOSIS — S72451A Displaced supracondylar fracture without intracondylar extension of lower end of right femur, initial encounter for closed fracture: Principal | ICD-10-CM | POA: Diagnosis present

## 2024-05-29 DIAGNOSIS — M810 Age-related osteoporosis without current pathological fracture: Secondary | ICD-10-CM | POA: Diagnosis present

## 2024-05-29 LAB — CBG MONITORING, ED: Glucose-Capillary: 159 mg/dL — ABNORMAL HIGH (ref 70–99)

## 2024-05-29 LAB — CBC
HCT: 34.8 % — ABNORMAL LOW (ref 36.0–46.0)
Hemoglobin: 11.4 g/dL — ABNORMAL LOW (ref 12.0–15.0)
MCH: 30.2 pg (ref 26.0–34.0)
MCHC: 32.8 g/dL (ref 30.0–36.0)
MCV: 92.3 fL (ref 80.0–100.0)
Platelets: 199 K/uL (ref 150–400)
RBC: 3.77 MIL/uL — ABNORMAL LOW (ref 3.87–5.11)
RDW: 14.3 % (ref 11.5–15.5)
WBC: 3.5 K/uL — ABNORMAL LOW (ref 4.0–10.5)
nRBC: 0 % (ref 0.0–0.2)

## 2024-05-29 LAB — BASIC METABOLIC PANEL WITH GFR
Anion gap: 9 (ref 5–15)
BUN: 15 mg/dL (ref 8–23)
CO2: 25 mmol/L (ref 22–32)
Calcium: 9 mg/dL (ref 8.9–10.3)
Chloride: 104 mmol/L (ref 98–111)
Creatinine, Ser: 0.85 mg/dL (ref 0.44–1.00)
GFR, Estimated: >60 mL/min
Glucose, Bld: 102 mg/dL — ABNORMAL HIGH (ref 70–99)
Potassium: 4.1 mmol/L (ref 3.5–5.1)
Sodium: 138 mmol/L (ref 135–145)

## 2024-05-29 LAB — SURGICAL PCR SCREEN
MRSA, PCR: NEGATIVE
Staphylococcus aureus: POSITIVE — AB

## 2024-05-29 LAB — GLUCOSE, CAPILLARY: Glucose-Capillary: 175 mg/dL — ABNORMAL HIGH (ref 70–99)

## 2024-05-29 LAB — PROTIME-INR
INR: 2.6 — ABNORMAL HIGH (ref 0.8–1.2)
Prothrombin Time: 29.2 s — ABNORMAL HIGH (ref 11.4–15.2)

## 2024-05-29 LAB — TYPE AND SCREEN
ABO/RH(D): A POS
Antibody Screen: NEGATIVE

## 2024-05-29 MED ORDER — LOPERAMIDE HCL 2 MG PO CAPS
2.0000 mg | ORAL_CAPSULE | Freq: Once | ORAL | Status: AC
  Administered 2024-05-29: 2 mg via ORAL
  Filled 2024-05-29: qty 1

## 2024-05-29 MED ORDER — HYDROCODONE-ACETAMINOPHEN 5-325 MG PO TABS
1.0000 | ORAL_TABLET | ORAL | Status: DC | PRN
  Administered 2024-05-29 – 2024-05-30 (×4): 1 via ORAL
  Filled 2024-05-29 (×4): qty 1

## 2024-05-29 MED ORDER — FENTANYL CITRATE (PF) 50 MCG/ML IJ SOSY
50.0000 ug | PREFILLED_SYRINGE | Freq: Once | INTRAMUSCULAR | Status: AC
  Administered 2024-05-29: 50 ug via INTRAVENOUS
  Filled 2024-05-29: qty 1

## 2024-05-29 MED ORDER — ENOXAPARIN SODIUM 40 MG/0.4ML IJ SOSY
40.0000 mg | PREFILLED_SYRINGE | INTRAMUSCULAR | Status: DC
  Administered 2024-05-29: 40 mg via SUBCUTANEOUS
  Filled 2024-05-29: qty 0.4

## 2024-05-29 MED ORDER — DICLOFENAC SODIUM 1 % EX GEL
4.0000 g | Freq: Four times a day (QID) | CUTANEOUS | Status: DC | PRN
  Filled 2024-05-29: qty 100

## 2024-05-29 MED ORDER — PANTOPRAZOLE SODIUM 40 MG PO TBEC
40.0000 mg | DELAYED_RELEASE_TABLET | Freq: Every day | ORAL | Status: DC
  Administered 2024-05-29 – 2024-06-04 (×7): 40 mg via ORAL
  Filled 2024-05-29 (×7): qty 1

## 2024-05-29 MED ORDER — AMLODIPINE BESYLATE 10 MG PO TABS
10.0000 mg | ORAL_TABLET | Freq: Every day | ORAL | Status: DC
  Administered 2024-05-29 – 2024-06-04 (×6): 10 mg via ORAL
  Filled 2024-05-29 (×2): qty 1
  Filled 2024-05-29: qty 2
  Filled 2024-05-29 (×4): qty 1

## 2024-05-29 MED ORDER — MUPIROCIN 2 % EX OINT
1.0000 | TOPICAL_OINTMENT | Freq: Two times a day (BID) | CUTANEOUS | Status: AC
  Administered 2024-05-30 – 2024-06-03 (×9): 1 via NASAL
  Filled 2024-05-29 (×3): qty 22

## 2024-05-29 MED ORDER — CHLORHEXIDINE GLUCONATE CLOTH 2 % EX PADS
6.0000 | MEDICATED_PAD | Freq: Every day | CUTANEOUS | Status: AC
  Administered 2024-05-30 – 2024-06-02 (×4): 6 via TOPICAL

## 2024-05-29 MED ORDER — CELECOXIB 200 MG PO CAPS
400.0000 mg | ORAL_CAPSULE | Freq: Every day | ORAL | Status: DC
  Administered 2024-05-29 – 2024-05-30 (×2): 400 mg via ORAL
  Filled 2024-05-29: qty 1
  Filled 2024-05-29 (×2): qty 2

## 2024-05-29 MED ORDER — ARFORMOTEROL TARTRATE 15 MCG/2ML IN NEBU
15.0000 ug | INHALATION_SOLUTION | Freq: Two times a day (BID) | RESPIRATORY_TRACT | Status: DC
  Administered 2024-05-29 – 2024-06-04 (×11): 15 ug via RESPIRATORY_TRACT
  Filled 2024-05-29 (×10): qty 2

## 2024-05-29 MED ORDER — UMECLIDINIUM BROMIDE 62.5 MCG/ACT IN AEPB
1.0000 | INHALATION_SPRAY | Freq: Every day | RESPIRATORY_TRACT | Status: DC
  Administered 2024-05-29 – 2024-06-04 (×7): 1 via RESPIRATORY_TRACT
  Filled 2024-05-29 (×2): qty 7

## 2024-05-29 MED ORDER — ROSUVASTATIN CALCIUM 20 MG PO TABS
40.0000 mg | ORAL_TABLET | Freq: Every day | ORAL | Status: DC
  Administered 2024-05-29 – 2024-05-30 (×2): 40 mg via ORAL
  Filled 2024-05-29 (×2): qty 2

## 2024-05-29 MED ORDER — INSULIN ASPART 100 UNIT/ML IJ SOLN
0.0000 [IU] | Freq: Three times a day (TID) | INTRAMUSCULAR | Status: DC
  Administered 2024-05-29: 2 [IU] via SUBCUTANEOUS
  Administered 2024-05-30 – 2024-06-03 (×8): 1 [IU] via SUBCUTANEOUS
  Filled 2024-05-29 (×3): qty 1
  Filled 2024-05-29: qty 2
  Filled 2024-05-29 (×5): qty 1

## 2024-05-29 MED ORDER — LOSARTAN POTASSIUM 50 MG PO TABS
50.0000 mg | ORAL_TABLET | Freq: Every day | ORAL | Status: DC
  Administered 2024-05-29 – 2024-06-04 (×6): 50 mg via ORAL
  Filled 2024-05-29 (×7): qty 1

## 2024-05-29 MED ORDER — ACETAMINOPHEN 325 MG PO TABS
650.0000 mg | ORAL_TABLET | Freq: Three times a day (TID) | ORAL | Status: DC
  Administered 2024-05-29 – 2024-05-30 (×3): 650 mg via ORAL
  Filled 2024-05-29 (×3): qty 2

## 2024-05-29 MED ORDER — ALBUTEROL SULFATE HFA 108 (90 BASE) MCG/ACT IN AERS: 1.0000 | INHALATION_SPRAY | Freq: Four times a day (QID) | RESPIRATORY_TRACT | Status: DC | PRN

## 2024-05-29 MED ORDER — PANCRELIPASE (LIP-PROT-AMYL) 36000-114000 UNITS PO CPEP
36000.0000 [IU] | ORAL_CAPSULE | Freq: Three times a day (TID) | ORAL | Status: DC
  Administered 2024-05-29: 36000 [IU] via ORAL
  Administered 2024-05-29 – 2024-06-02 (×9): 72000 [IU] via ORAL
  Filled 2024-05-29: qty 2
  Filled 2024-05-29: qty 1
  Filled 2024-05-29 (×14): qty 2

## 2024-05-29 MED ORDER — HYDROMORPHONE HCL 1 MG/ML IJ SOLN
1.0000 mg | Freq: Once | INTRAMUSCULAR | Status: AC
  Administered 2024-05-29: 1 mg via INTRAVENOUS
  Filled 2024-05-29: qty 1

## 2024-05-29 MED ORDER — DULOXETINE HCL 60 MG PO CPEP
60.0000 mg | ORAL_CAPSULE | Freq: Two times a day (BID) | ORAL | Status: DC
  Administered 2024-05-29 – 2024-06-04 (×13): 60 mg via ORAL
  Filled 2024-05-29 (×8): qty 1
  Filled 2024-05-29: qty 2
  Filled 2024-05-29 (×5): qty 1

## 2024-05-29 NOTE — ED Notes (Signed)
 Pt's family member requested administrator to come to bedside to report concerns of lack of pillows in the ED. Provided pt with number and email of patient relations.

## 2024-05-29 NOTE — Hospital Course (Addendum)
 Atypical atraumatic right femur fracture Mechanical fall Patient presented with comminuted right femur fracture after stepping out of her vehicle and feeling her leg give out from under her.  Patient's vital signs were stable on admission and x-ray showed comminuted displaced fracture of the distal femoral metaphysis on her right lower extremity.  Orthopedic surgery was consulted and recommended ORIF after brief Xarelto  washout period.  Patient was on nonweightbearing restriction and started on pain regimen of hydrocodone  as needed, Dilaudid  as needed with scheduled Tylenol  and Cymbalta .  Orthopedic surgery performed open reduction internal fixation of her right supracondylar distal femur fracture and given perioperative antibiotics for infection prophylaxis.  Procedure went well and patient's pain was controlled throughout her hospitalization.  We resumed her Xarelto  and had physical therapy and occupational therapy evaluate the patient and give their recommendations of SNF  Given this patient's history of minimal trauma to cause fracture may consider updated dexa or bisphosphonates.   Hypertension Patient presented mildly hypertensive in the ED, at home medication's included amlodipine , hydralazine, losartan , and Lasix as needed.  Resumed patient's amlodipine  and losartan  daily, and patient's blood pressures remained normotensive throughout the remainder of her admission.   Normocytic anemia Hemoglobin 11.4 on admission which is stable from August 2025, and recheck was 11.3. We ordered an iron panel returned which returned within normal limits and vitamin D  level of 57.2.  Stable Chronic Problems: pancreatic insufficiency - continue Creon  three times daily before meals COPD - continue Brovana  two times daily, ellipta daily, and albuterol  as needed T2DM - continue sensitive SSI

## 2024-05-29 NOTE — ED Notes (Signed)
 Informed by another RN that patient family would like to speak to an administer, they gave patient the patient relations number to call for complaints, and patient visitor asking for a pillow. This RN went to check if we had any pillows. No new pillows available on the unit. This RN went to get some warm blankets to use as a pillow. This RN delivered the 2 warm blankets to the patient and placed them behind her head. Pt and visitors made aware that we do not have anymore new pillows on the unit. Pt visitor states they would still like to speak with an production designer, theatre/television/film in person. Informed patients visitor that I would work on getting someone to come speak to them. As this RN was working on getting an admin to come to the room. The visitor who was requesting an admin stated they were leaving and they did not want to wait to speak with someone. Pt visitor stated I've walked past rooms with pillows in them. [place] is trash. First yall put her in a dirty room. Then pillow in the room was dirty. This RN apologized to the visitor. Pt visitor left. Patient with no complaints at this time.

## 2024-05-29 NOTE — Progress Notes (Signed)
 [facility] CONSULT NOTE  Patient Care Team: Catalina Bare, MD as PCP - General (Internal Medicine) Monetta Redell PARAS, MD as Consulting Physician (Cardiology) Vanderbilt Ned, MD as Consulting Physician (General Surgery) Dewey Rush, MD as Consulting Physician (Radiation Oncology) Loretha Ash, MD as Consulting Physician (Hematology and Oncology)  CHIEF COMPLAINTS/PURPOSE OF CONSULTATION:  DCIS  ASSESSMENT & PLAN:   Assessment and Plan Assessment & Plan  Ductal carcinoma in situ of left breast Ongoing surveillance post-ductal carcinoma in situ. Unable to tolerate adjuvant endocrine therapy due to severe side effects. Current breast exam and last mammogram unremarkable. No new breast complaints reported. - Ordered screening mammogram for July. - Instructed monthly self-breast exams and to report new palpable masses. - Provided guidance on mammogram scheduling. - Planned follow-up in one year if asymptomatic.  History of venous thromboembolism, on anticoagulation Multiple prior venous thromboembolic events managed with Xarelto  for secondary prevention. Continued anticoagulation necessary. - Advised continuation of Xarelto  without interruption.   HISTORY OF PRESENTING ILLNESS:  [name] 69 y.o. female is here because of DCIS  Oncology History  Ductal carcinoma in situ (DCIS) of left breast  07/04/2022 Mammogram   Screening mammogram suggested further evaluation for calcifications in the left breast.  She then had a diagnostic mammogram which showed 4.8 cm span of indeterminate calcifications in the left breast warranting tissue diagnosis.   08/23/2022 Pathology Results   Left breast needle core biopsy showed grade 3 DCIS, necrosis and calcifications present, prognostic showed ER 100% positive strong staining PR 60% positive moderate to strong staining   09/06/2022 Genetic Testing   Negative genetic testing on the Multi-Cancer+RNA gene panel.  CHEK2  c.164C>T (p.Ser55Phe), CHEK2 c.542G>A (p.Arg181His), MSH3 c.2600T>C (p.Ile867Thr), and RET c.2611G>A (p.Val871Ile) VUS were identified.  The report date is September 06, 2022. UPDATE: RET c.2611G>A (p.Val871Ile) and CHEK2 c.542G>A (p.Arg181His) have been reclassified to Likely benign.  The amended report date is August 14, 2023.  The Multi-Cancer + RNA Panel offered by [facility] includes sequencing and/or deletion/duplication analysis of the following 70 genes:  AIP*, ALK, APC*, ATM*, AXIN2*, BAP1*, BARD1*, BLM*, BMPR1A*, BRCA1*, BRCA2*, BRIP1*, CDC73*, CDH1*, CDK4, CDKN1B*, CDKN2A, CHEK2*, CTNNA1*, DICER1*, EPCAM (del/dup only), EGFR, FH*, FLCN*, GREM1 (promoter dup only), HOXB13, KIT, LZTR1, MAX*, MBD4, MEN1*, MET, MITF, MLH1*, MSH2*, MSH3*, MSH6*, MUTYH*, NF1*, NF2*, NTHL1*, PALB2*, PDGFRA, PMS2*, POLD1*, POLE*, POT1*, PRKAR1A*, PTCH1*, PTEN*, RAD51C*, RAD51D*, RB1*, RET, SDHA* (sequencing only), SDHAF2*, SDHB*, SDHC*, SDHD*, SMAD4*, SMARCA4*, SMARCB1*, SMARCE1*, STK11*, SUFU*, TMEM127*, TP53*, TSC1*, TSC2*, VHL*. RNA analysis is performed for * genes.    11/23/2022 Surgery   DCIS, 4cm, intermediate to high grade, with necrosis, ER/PR psoitive   11/23/2022 Cancer Staging   Staging form: Breast, AJCC 8th Edition - Pathologic stage from 11/23/2022: Stage 0 (pTis (DCIS), pN0, cM0, ER+, PR+) - Signed by Crawford Morna Pickle, NP on 05/29/2023 Stage prefix: Initial diagnosis   01/12/2023 - 02/10/2023 Radiation Therapy   Plan Name: Breast_L_BH Site: Breast, Left Technique: 3D Mode: Photon Dose Per Fraction: 2.66 Gy Prescribed Dose (Delivered / Prescribed): 42.56 Gy / 42.56 Gy Prescribed Fxs (Delivered / Prescribed): 16 / 16   Plan Name: Brst_L_Bst_BH Site: Breast, Left Technique: 3D Mode: Photon Dose Per Fraction: 2 Gy Prescribed Dose (Delivered / Prescribed): 8 Gy / 8 Gy Prescribed Fxs (Delivered / Prescribed): 4 / 4   03/2023 -  Anti-estrogen oral therapy   Anastrozole     At baseline she has  COPD, diabetes, history of DVT and PE and is on chronic  anticoagulation.  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  No history of breast cancer in the family however several other cancers run in the family.  Maternal aunt had ovarian cancer, sister had renal cancer, dad had metastatic carcinoma of unknown primary.  She has 2 children, 2 boys.  No history of hormone replacement therapy.  She complains of occasional back pain for which she takes prednisone , otherwise significant history of DVT for which she stayed on Coumadin and PE subsequently hence she was recommended indefinite anticoagulation.  There is also strong family history of blood clots.  Sister had saddle pulmonary embolism.    She had left breast lumpectomy which showed DCIS, cribriform and solid intermediate to high nuclear grade with necrosis, DCIS measured 4 cm, negative margins, ER/PR positive.  ER 100% positive strong staining PR 40% positive moderate to strong staining.    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  She is being followed for intraductal carcinoma in situ of the left breast. Her last mammogram in July 2025 was unremarkable, and she is scheduled for repeat imaging in July of this year. She previously discontinued adjuvant oral therapy for breast cancer due to severe side effects, including profound loss of mobility. She currently has no new breast symptoms, including no palpable masses or pain.  She reports significant unintentional weight loss since her recent cholecystectomy, with her weight decreasing from approximately 230 lbs to 185 lbs. She attributes the majority of this weight loss to Mounjaro, which she takes for diabetes, and  notes that her primary care provider recently reduced the dose due to the extent of her weight loss. She describes persistent gastrointestinal symptoms, including daily steatorrhea with oily, jelly-like, and mucous-containing stools. She states that she experiences rapid gastrointestinal transit with fear of eating due to these symptoms and is concerned about malabsorption. She is currently taking Creon  for pancreatic enzyme replacement but questions whether the dose is adequate given her ongoing symptoms. She also describes chronic subjective feverish feeling in her face increased skin laxity, and altered thermal sensation, now feeling cold and requiring multiple layers of clothing, which she attributes to loss of subcutaneous fat.  She remains on Xarelto  for anticoagulation due to a history of multiple venous thromboembolic events.   Rest of the pertinent 10 point ROS reviewed and negative  MEDICAL HISTORY:  Past Medical History:  Diagnosis Date   Asthma    Breast cancer (HCC) 08/23/2022   Bronchitis, asthmatic    COPD (chronic obstructive pulmonary disease) (HCC)    Coronary artery calcification seen on CT scan    Diabetes (HCC)    DVT (deep venous thrombosis) (HCC)    Family history of kidney cancer    Family history of ovarian cancer    Family history of prostate cancer    Hyperlipidemia    Hypertension    Observed sleep apnea     SURGICAL HISTORY: Past Surgical History:  Procedure Laterality Date   ABDOMINAL HYSTERECTOMY     BREAST BIOPSY Left 08/23/2022   MM LT BREAST BX W LOC DEV 1ST LESION IMAGE BX SPEC STEREO GUIDE 08/23/2022 [facility]   BREAST BIOPSY Left 08/23/2022   MM LT BREAST BX W LOC DEV EA AD LESION IMG BX SPEC STEREO  GUIDE 08/23/2022 [facility]   BREAST BIOPSY  11/22/2022   MM LT RADIOACTIVE SEED EA ADD LESION LOC MAMMO GUIDE 11/22/2022 [facility]   BREAST BIOPSY  11/22/2022   MM LT RADIOACTIVE SEED LOC MAMMO GUIDE 11/22/2022 [facility]   BREAST LUMPECTOMY WITH RADIOACTIVE SEED LOCALIZATION Left 11/23/2022   Procedure: LEFT BREAST SEED BRACKETED CENTRAL LUMPECTOMY AND NIPPLE RESECTION;  Surgeon: Vanderbilt Ned, MD;  Location: [facility];  Service: General;  Laterality: Left;   CARDIAC CATHETERIZATION     1996   CHOLECYSTECTOMY N/A 12/14/2023   Procedure: LAPAROSCOPIC CHOLECYSTECTOMY;  Surgeon: Vanderbilt Ned, MD;  Location: [facility] OR;  Service: General;  Laterality: N/A;   INDOCYANINE GREEN  FLUORESCENCE IMAGING (ICG) N/A 12/14/2023   Procedure: INDOCYANINE GREEN  FLUORESCENCE IMAGING (ICG);  Surgeon: Vanderbilt Ned, MD;  Location: [facility] OR;  Service: General;  Laterality: N/A;   KNEE ARTHROSCOPY Bilateral    TOTAL KNEE ARTHROPLASTY Bilateral     SOCIAL HISTORY: Social History   Socioeconomic History   Marital status: Single    Spouse name: Not on file   Number of children: Not on file   Years of education: Not on file   Highest education level: Not on file  Occupational History   Not on file  Tobacco Use   Smoking status: Every Day    Current packs/day: 1.50    Average packs/day: 1.5 packs/day for 60.0 years (90.0 ttl pk-yrs)    Types: Cigarettes    Start date: 06/07/1964    Passive exposure: Current   Smokeless tobacco: Never  Vaping Use   Vaping status: Never Used  Substance and Sexual Activity   Alcohol use: Yes    Comment: occas   Drug use: Not Currently    Types: Marijuana, Crack cocaine    Comment: None since 1999   Sexual activity: Not on file  Other Topics Concern   Not on file  Social History Narrative   Not on file   Social Drivers of Health   Tobacco Use: High Risk (05/29/2024)   Patient History    Smoking Tobacco Use: Every Day    Smokeless Tobacco Use: Never    Passive Exposure: Current  Financial Resource Strain: Not on file  Food Insecurity: No Food Insecurity (05/29/2024)   Epic    Worried About Programme Researcher, Broadcasting/film/video in the Last Year: Never true    Ran Out of  Food in the Last Year: Never true  Transportation Needs: No Transportation Needs (05/29/2024)   Epic    Lack of Transportation (Medical): No    Lack of Transportation (Non-Medical): No  Physical Activity: Not on file  Stress: Not on file  Social Connections: Not on file  Intimate Partner Violence: Not At Risk (05/29/2024)   Epic    Fear of Current or Ex-Partner: No    Emotionally Abused: No    Physically Abused: No    Sexually Abused: No  Depression (PHQ2-9): Low Risk (05/29/2024)   Depression (PHQ2-9)    PHQ-2 Score: 0  Alcohol Screen: Not on file  Housing: Low Risk (05/29/2024)   Epic    Unable to Pay for Housing in the Last Year: No    Number of Times Moved in the Last Year: 0    Homeless in the Last Year: No  Utilities: Not At Risk (05/29/2024)   Epic    Threatened with loss of utilities: No  Health Literacy: Not on file    FAMILY HISTORY: Family History  Problem Relation  Age of Onset   Pulmonary embolism Mother    Heart Problems Mother    Hypertension Mother    Heart attack Father    Diabetes Father    Pulmonary embolism Sister    Healthy Sister    Pulmonary embolism Brother    Prostate cancer Brother 82   Kidney cancer Brother 69   Pulmonary embolism Brother    Ovarian cancer Maternal Aunt     ALLERGIES:  is allergic to codeine and tramadol.  MEDICATIONS:  No current facility-administered medications for this visit.   Current Outpatient Medications  Medication Sig Dispense Refill   albuterol  (VENTOLIN  HFA) 108 (90 Base) MCG/ACT inhaler Inhale 1-2 puffs into the lungs every 6 (six) hours as needed for wheezing or shortness of breath.     amLODipine  (NORVASC ) 10 MG tablet Take 10 mg by mouth daily.     CALCIUM  PO Take 1 tablet by mouth daily.     celecoxib  (CELEBREX ) 200 MG capsule Take 200 mg by mouth daily.     Cyanocobalamin (B-12 PO) Take 1 tablet by mouth daily.     cyclobenzaprine (FLEXERIL) 10 MG tablet Take 5-10 mg by mouth at bedtime as needed for  muscle spasms.     diclofenac  Sodium (VOLTAREN ) 1 % GEL Apply 1 application  topically 4 (four) times daily as needed (pain).     diphenoxylate-atropine (LOMOTIL) 2.5-0.025 MG tablet Take 1 tablet by mouth 2 (two) times daily as needed for diarrhea or loose stools.     DULoxetine  (CYMBALTA ) 60 MG capsule Take 60 mg by mouth 2 (two) times daily.     enoxaparin  (LOVENOX ) 100 MG/ML injection Inject 1 mL (100 mg total) into the skin every 12 (twelve) hours. 6 mL 0   esomeprazole (NEXIUM) 40 MG capsule Take 40 mg by mouth daily.     furosemide (LASIX) 40 MG tablet Take 40 mg by mouth daily.     hydrALAZINE (APRESOLINE) 25 MG tablet Take 25 mg by mouth daily.     HYDROcodone -acetaminophen  (NORCO/VICODIN) 5-325 MG tablet Take 1 tablet by mouth in the morning, at noon, and at bedtime.     JARDIANCE 10 MG TABS tablet Take 10 mg by mouth daily.     lidocaine  (LIDODERM ) 5 % Place 1 patch onto the skin daily as needed (pain).     lipase/protease/amylase (CREON ) 36000 UNITS CPEP capsule Take 36,000 Units by mouth 3 (three) times daily before meals.     losartan  (COZAAR ) 100 MG tablet Take 100 mg by mouth daily.     MAGNESIUM PO Take 1 tablet by mouth daily.     Multiple Vitamins-Minerals (CENTRUM ADULTS PO) Take 1 tablet by mouth daily.     nitroGLYCERIN  (NITROSTAT ) 0.4 MG SL tablet Place 1 tablet (0.4 mg total) under the tongue every 5 (five) minutes as needed. 30 tablet 3   nortriptyline (PAMELOR) 50 MG capsule Take 50 mg by mouth at bedtime as needed (sleep).     ondansetron  (ZOFRAN ) 4 MG tablet Take 4 mg by mouth every 8 (eight) hours as needed for nausea or vomiting.     oxyCODONE  (OXY IR/ROXICODONE ) 5 MG immediate release tablet Take 1 tablet (5 mg total) by mouth every 6 (six) hours as needed for severe pain (pain score 7-10). 15 tablet 0   potassium chloride (KLOR-CON M) 10 MEQ tablet Take 10 mEq by mouth daily.     rivaroxaban  (XARELTO ) 20 MG TABS tablet Take 20 mg by mouth daily with supper.  rosuvastatin  (CRESTOR ) 40 MG tablet Take 40 mg by mouth at bedtime.     Tiotropium Bromide-Olodaterol (STIOLTO RESPIMAT ) 2.5-2.5 MCG/ACT AERS INHALE 2 PUFFS BY MOUTH INTO THE LUNGS DAILY (Patient not taking: Reported on 12/07/2023) 4 g 0   tirzepatide (MOUNJARO) 10 MG/0.5ML Pen Inject 10 mg into the skin once a week.     Facility-Administered Medications Ordered in Other Visits  Medication Dose Route Frequency Provider Last Rate Last Admin   HYDROmorphone  (DILAUDID ) injection 1 mg  1 mg Intravenous Once Scott, Erin N, PA-C         PHYSICAL EXAMINATION: ECOG PERFORMANCE STATUS: 0 - Asymptomatic  BP 138/64 (BP Location: Left Arm, Patient Position: Sitting)   Pulse 84   Temp 98.1 F (36.7 C)   Resp 17   Wt 185 lb (83.9 kg)   SpO2 99%   BMI (P) 33.84 kg/m   GENERAL:alert, no distress and comfortable, walks with a walker Bilateral breast exam, no palpable masses, no regional adenopathy Significant weight loss noted.   LABORATORY DATA:  I have reviewed the data as listed Lab Results  Component Value Date   WBC 3.6 (L) 12/11/2023   HGB 11.9 (L) 12/11/2023   HCT 36.9 12/11/2023   MCV 95.3 12/11/2023   PLT 231 12/11/2023     Chemistry      Component Value Date/Time   NA 144 12/11/2023 1100   NA 144 06/03/2021 0939   K 3.3 (L) 12/11/2023 1100   CL 109 12/11/2023 1100   CO2 26 12/11/2023 1100   BUN 9 12/11/2023 1100   BUN 12 06/03/2021 0939   CREATININE 1.02 (H) 12/11/2023 1100   CREATININE 0.97 08/31/2022 1000      Component Value Date/Time   CALCIUM  9.1 12/11/2023 1100   ALKPHOS 41 08/31/2022 1000   AST 35 08/31/2022 1000   ALT 31 08/31/2022 1000   BILITOT 0.3 08/31/2022 1000       RADIOGRAPHIC STUDIES: I have personally reviewed the radiological images as listed and agreed with the findings in the report. No results found.  All questions were answered. The patient knows to call the clinic with any problems, questions or concerns.      Amber Stalls,  MD 05/29/2024 11:29 AM

## 2024-05-29 NOTE — Progress Notes (Signed)
 Orthopedic Tech Progress Note Patient Details:  [name] 1956/04/14 [id number]  Ortho Devices Type of Ortho Device: Knee Immobilizer Ortho Device/Splint Location: RLE Ortho Device/Splint Interventions: Ordered, Application, Adjustment   Post Interventions Patient Tolerated: Fair Instructions Provided: Care of device  [name] Pac 05/29/2024, 1:36 PM

## 2024-05-29 NOTE — ED Provider Notes (Signed)
 " [place] EMERGENCY DEPARTMENT AT [facility] Provider Note   CSN: [id number] Arrival date & time: 05/29/24  1001     Patient presents with: [place]   [name] is a 69 y.o. female.   69 year old female presenting after a fall.  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  Patient was getting out of the car for a doctor's visit earlier this morning when she says her right leg gave out from under me, causing her to collapse with the majority of the impact occurring to her R side. She is complaining of pain on her R side from the hip down. Denies head injury or loss of consciousness, fall was unwitnessed.    Fall       Prior to Admission medications  Medication Sig Start Date End Date Taking? Authorizing Provider  albuterol  (VENTOLIN  HFA) 108 (90 Base) MCG/ACT inhaler Inhale 1-2 puffs into the lungs every 6 (six) hours as needed for wheezing or shortness of breath.    [provider]  amLODipine  (NORVASC ) 10 MG tablet Take 10 mg by mouth daily.    [provider]  CALCIUM  PO Take 1 tablet by mouth daily.    [provider]  celecoxib  (CELEBREX ) 200 MG capsule Take 200 mg by mouth daily. 03/31/21   [provider]  Cyanocobalamin (B-12 PO) Take 1 tablet by mouth daily.    [provider]  cyclobenzaprine (FLEXERIL) 10 MG tablet Take 5-10 mg by mouth at bedtime as needed for muscle spasms. 03/29/21   [provider]  diclofenac  Sodium (VOLTAREN ) 1 % GEL Apply 1 application  topically 4 (four) times daily as needed (pain). 04/07/21   [provider]  diphenoxylate-atropine (LOMOTIL) 2.5-0.025 MG tablet Take 1 tablet by mouth 2 (two) times daily as needed for diarrhea or loose stools. 11/25/23   [provider]  DULoxetine  (CYMBALTA ) 60 MG capsule Take 60 mg by mouth 2 (two) times daily. 03/23/21   [provider]  enoxaparin  (LOVENOX ) 100 MG/ML injection Inject 1 mL  (100 mg total) into the skin every 12 (twelve) hours. 11/08/23   Iruku, Praveena, MD  esomeprazole (NEXIUM) 40 MG capsule Take 40 mg by mouth daily. 04/07/14   [provider]  furosemide (LASIX) 40 MG tablet Take 40 mg by mouth daily. 11/19/16   [provider]  hydrALAZINE (APRESOLINE) 25 MG tablet Take 25 mg by mouth daily.    [provider]  HYDROcodone -acetaminophen  (NORCO/VICODIN) 5-325 MG tablet Take 1 tablet by mouth in the morning, at noon, and at bedtime. 02/20/22   [provider]  JARDIANCE 10 MG TABS tablet Take 10 mg by mouth daily. 06/26/23   [provider]  lidocaine  (LIDODERM ) 5 % Place 1 patch onto the skin daily as needed (pain). 02/09/22   [provider]  lipase/protease/amylase (CREON ) 36000 UNITS CPEP capsule Take 36,000 Units by mouth 3 (three) times daily before meals.    [provider]  losartan  (COZAAR ) 100 MG tablet Take 100 mg by mouth daily. 04/03/21   [provider]  MAGNESIUM PO Take 1 tablet by mouth daily.    [provider]  Multiple Vitamins-Minerals (CENTRUM ADULTS PO) Take 1 tablet by mouth daily.    [provider]  nitroGLYCERIN  (NITROSTAT ) 0.4 MG SL tablet Place 1 tablet (0.4 mg total) under the tongue every 5 (five) minutes as needed. 06/03/21 12/07/23  Monetta Redell PARAS, MD  nortriptyline (PAMELOR) 50 MG capsule  Take 50 mg by mouth at bedtime as needed (sleep).    [provider]  ondansetron  (ZOFRAN ) 4 MG tablet Take 4 mg by mouth every 8 (eight) hours as needed for nausea or vomiting.    [provider]  oxyCODONE  (OXY IR/ROXICODONE ) 5 MG immediate release tablet Take 1 tablet (5 mg total) by mouth every 6 (six) hours as needed for severe pain (pain score 7-10). 12/14/23   Cornett, Debby, MD  potassium chloride (KLOR-CON M) 10 MEQ tablet Take 10 mEq by mouth daily.    [provider]  rivaroxaban  (XARELTO ) 20 MG TABS tablet Take 20 mg by mouth  daily with supper.    [provider]  rosuvastatin  (CRESTOR ) 40 MG tablet Take 40 mg by mouth at bedtime. 08/09/10   [provider]  Tiotropium Bromide-Olodaterol (STIOLTO RESPIMAT ) 2.5-2.5 MCG/ACT AERS INHALE 2 PUFFS BY MOUTH INTO THE LUNGS DAILY Patient not taking: Reported on 12/07/2023 04/05/21   Jude Harden GAILS, MD  tirzepatide [facility]) 10 MG/0.5ML Pen Inject 10 mg into the skin once a week. 08/14/22   [provider]    Allergies: Codeine and Tramadol    Review of Systems  Updated Vital Signs  Vitals:   05/29/24 1009 05/29/24 1010 05/29/24 1014  BP:  (!) 160/73   Pulse:  76   Resp:  18   Temp:   (!) 97.5 F (36.4 C)  TempSrc:   Oral  SpO2:  100%   Weight: 83.9 kg    Height: 5' 2 (1.575 m)       Physical Exam Vitals and nursing note reviewed.  Constitutional:      Appearance: Normal appearance.  HENT:     Head: Normocephalic and atraumatic.     Comments: No Battle sign or raccoon eyes    Mouth/Throat:     Mouth: Mucous membranes are moist.  Eyes:     Extraocular Movements: Extraocular movements intact.     Pupils: Pupils are equal, round, and reactive to light.  Cardiovascular:     Rate and Rhythm: Normal rate and regular rhythm.     Heart sounds: Normal heart sounds.     Comments: No chest wall tenderness Pulmonary:     Effort: Pulmonary effort is normal.     Breath sounds: Normal breath sounds.  Abdominal:     Palpations: Abdomen is soft.     Tenderness: There is no abdominal tenderness. There is no guarding.  Musculoskeletal:     Cervical back: Normal range of motion and neck supple. No rigidity or tenderness.     Comments: Back: No midline spinous process tenderness, no bony deformity/step-off.  RLE: Bony tenderness at the hip/proximal femur, bony tenderness at the knee with soft tissue swelling, bony tenderness at the lateral malleolus with appreciable swelling. 2+ DP pulse, sensation in-tact to RLE. No obvious bony deformity. No  bony tenderness of the shoulders/elbows/wrists bilaterally or the LLE hip/knee/ankle   Skin:    General: Skin is warm and dry.  Neurological:     General: No focal deficit present.     Mental Status: She is alert and oriented to person, place, and time.     Sensory: No sensory deficit.     Motor: No weakness.     Comments: Facial expressions are symmetric and in-tact without evidence of facial droop     (all labs ordered are listed, but only abnormal results are displayed) Labs Reviewed - No data to display  EKG: None  Radiology: No  results found.   Procedures   Medications Ordered in the ED  fentaNYL  (SUBLIMAZE ) injection 50 mcg (has no administration in time range)                                    Medical Decision Making This patient presents to the ED for concern of fall, this involves an extensive number of treatment options, and is a complaint that carries with it a high risk of complications and morbidity.  The differential diagnosis includes fracture, dislocation, contusion, intracranial hemorrhage, concussion   Co morbidities that complicate the patient evaluation  H/o DVT/PE on Xarelto    Additional history obtained:  Additional history obtained from record review External records from outside source obtained and reviewed including recent oncology note   Lab Tests:  I Ordered, and personally interpreted labs.  The pertinent results include: CBC with leukopenia and mildly low hgb that is stable from previous.  BMP unremarkable.  PT/INR prolonged at 29.2 and 2.6 respectively.   Imaging Studies ordered:  I ordered imaging studies including CT head, pelvic XR, R hip/pelvis XR, R femur XR, R tib/fib XR I independently visualized and interpreted imaging which showed  - XR tib/fib: No acute findings.  - XR femur: Comminuted displaced fracture of the distal femoral metaphysis, just above the femoral component of the knee arthroplasty. Moderate ankle  osteoarthritis. - XR pelvis: No evidence of pelvic fracture or other acute findings. - CT head: No acute findings.  I agree with the radiologist interpretation   Cardiac Monitoring: / EKG:  The patient was maintained on a cardiac monitor.  I personally viewed and interpreted the cardiac monitored which showed an underlying rhythm of: NSR   Consultations Obtained:  I requested consultation with ortho and hospitalist,  and discussed lab and imaging findings as well as pertinent plan - they recommend: I spoke with Ozell Ned PA-C with orthopedics, recommends knee immobilizer with non-weightbearing status, will likely undergo surgical fixation on Friday. I spoke with the hospitalist service who is in agreement that this patient is appropriate for admission.    Problem List / ED Course / Critical interventions / Medication management  I ordered medication including fentanyl   for pain, dilaudid  for breakthrough pain Reevaluation of the patient after these medicines showed that the patient improved I have reviewed the patients home medicines and have made adjustments as needed   Social Determinants of Health:  Tobacco use   Test / Admission - Considered:  Physical exam is notable as above, patient is demonstrating pain to the right lower extremity from the hip down, she is A&O x 4 and is answering my questions appropriately at the bedside, no midline spinous process tenderness, no appreciable sensory deficits/weakness, no neurologic deficits. On Xarelto  given history of DVT/PE.  Denies head injury or loss of consciousness at time of fall, however this was unwitnessed, will proceed with head CT as well as x-ray imaging of the right lower extremity. X-ray imaging is notable as above, patient does have a displaced distal femur fracture of the right lower extremity. I spoke with Ortho as above who recommends knee immobilizer and nonweightbearing status, plan to admit to medicine service  as she will likely undergo operative intervention sometime Friday. I spoke with the patient and her family members who voiced understanding and are in agreement this plan.   Staffed with Dr. Ruthe  Amount and/or Complexity of Data Reviewed Labs: ordered.  Radiology: ordered.  Risk Prescription drug management. Decision regarding hospitalization.        Final diagnoses:  Closed displaced fracture of distal epiphysis of right femur, initial encounter [facility])    ED Discharge Orders     None          Glendia Rocky LOISE DEVONNA 05/29/24 1246    Ruthe Cornet, DO 05/29/24 1255  "

## 2024-05-29 NOTE — ED Notes (Signed)
 CCMD called and verified patient on cardiac telemetry

## 2024-05-29 NOTE — ED Notes (Signed)
 Pt received dinner tray. Informed patient that I will request the pharmacist send her creon  so she can take it before she eats. Pt states it will mess up her stomach if she doesn't take before a meal.

## 2024-05-29 NOTE — H&P (Cosign Needed Addendum)
 " Date: 05/29/2024               Patient Name:  [name] MRN: [id number]  DOB: 30-Jun-1955 Age / Sex: 69 y.o., female   PCP: Mary Rosina SAILOR, PA         Medical Service: Internal Medicine Teaching Service         Attending Physician: Dr. Pouch, Mliss Dragon, MD      First Contact: Dr. Letha Cheadle, MD    Second Contact: Dr. Missy Sandhoff, MD         After Hours (After 5p/  First Contact Pager: [phone]  weekends / holidays): Second Contact Pager: [phone]   SUBJECTIVE   Chief Complaint: Fall  History of Present Illness:  [name] is a 69 year old female with a past medical history of hypertension, COPD, type 2 diabetes, prior DVT/PE on anticoagulation, breast cancer s/p radiation and anti-estrogen therapy who presents after mechanical fall while getting out of the car this morning.  She first went to her orthopedic office where she had bilateral hip injections.  This is the first time that she has gotten the right hip injected and after the procedure she felt some numbness in her right leg.  She went to 2 other appointments and when getting out of the car for the third appointment her leg still felt numb and gave out from under her.  She denied any prodromal symptoms, loss of consciousness, head injury, previous falls, or previous near falls.  She last took her medications yesterday including her Xarelto  yesterday evening.  ED Course: Presented as above with normal vital signs.  X-ray showed comminuted displaced fracture of the distal femoral metaphysis on the right.  Orthopedic surgery was consulted and recommended admission for planned surgery after blood thinner holiday.  Past Medical History COPD Type 2 diabetes Prior DVT/PE on anticoagulation Breast cancer s/p radiation and antiestrogen therapy Hypertension  Meds:  Amlodipine  10 mg daily Celecoxib  400 mg daily Diclofenac  gel as needed Lomotil twice daily as needed Duloxetine  60 mg twice  daily Esomeprazole 40 mg daily Lasix 40 mg daily as needed Hydrocodone /acetaminophen  5/325 mg every 6 hours as needed Creon  with meals Potassium chloride 10 mEq daily Xarelto  20 mg daily Stiolto Losartan  100 mg daily Hydralazine 25 mg daily Tirzepatide 7.5 mg weekly, last dose 05/28/2024  Past Surgical History Past Surgical History:  Procedure Laterality Date   ABDOMINAL HYSTERECTOMY     BREAST BIOPSY Left 08/23/2022   MM LT BREAST BX W LOC DEV 1ST LESION IMAGE BX SPEC STEREO GUIDE 08/23/2022 [facility]   BREAST BIOPSY Left 08/23/2022   MM LT BREAST BX W LOC DEV EA AD LESION IMG BX SPEC STEREO GUIDE 08/23/2022 [facility]   BREAST BIOPSY  11/22/2022   MM LT RADIOACTIVE SEED EA ADD LESION LOC MAMMO GUIDE 11/22/2022 [facility]   BREAST BIOPSY  11/22/2022   MM LT RADIOACTIVE SEED LOC MAMMO GUIDE 11/22/2022 [facility]   BREAST LUMPECTOMY WITH RADIOACTIVE SEED LOCALIZATION Left 11/23/2022   Procedure: LEFT BREAST SEED BRACKETED CENTRAL LUMPECTOMY AND NIPPLE RESECTION;  Surgeon: Vanderbilt Ned, MD;  Location: [facility];  Service: General;  Laterality: Left;   CARDIAC CATHETERIZATION     1996   CHOLECYSTECTOMY N/A 12/14/2023   Procedure: LAPAROSCOPIC CHOLECYSTECTOMY;  Surgeon: Vanderbilt Ned, MD;  Location: [facility];  Service: General;  Laterality: N/A;   INDOCYANINE GREEN  FLUORESCENCE IMAGING (ICG) N/A 12/14/2023   Procedure: INDOCYANINE GREEN  FLUORESCENCE IMAGING (ICG);  Surgeon: Vanderbilt,  Debby, MD;  Location: [facility];  Service: General;  Laterality: N/A;   KNEE ARTHROSCOPY Bilateral    TOTAL KNEE ARTHROPLASTY Bilateral     Social:  Lives with a friend.  Independent in ADLs and IADLs.  Ambulates with a cane and sometimes with a walker at home. PCP: Mary Rosina SAILOR, PA Substances: Current smoker, 1/2 pack/day, 25+ pack years.  Denies alcohol or drug use.    Family History:  Family History  Problem Relation Age of Onset   Pulmonary embolism  Mother    Heart Problems Mother    Hypertension Mother    Heart attack Father    Diabetes Father    Pulmonary embolism Sister    Healthy Sister    Pulmonary embolism Brother    Prostate cancer Brother 36   Kidney cancer Brother 51   Pulmonary embolism Brother    Ovarian cancer Maternal Aunt     Allergies: Allergies as of 05/29/2024 - Review Complete 05/29/2024  Allergen Reaction Noted   Codeine Itching 12/25/2006   Tramadol Nausea And Vomiting and Other (See Comments) 04/24/2023    Review of Systems: A complete ROS was negative except as per HPI.   OBJECTIVE:   Physical Exam: Blood pressure (!) 160/73, pulse 76, temperature (!) 97.5 F (36.4 C), temperature source Oral, resp. rate 18, height 5' 2 (1.575 m), weight 83.9 kg, SpO2 100%.  Constitutional: Slightly uncomfortable appearing elderly female. In no acute distress. HENT: Normocephalic, atraumatic,  Eyes: Sclera non-icteric, PERRL, EOM intact Cardio:Regular rate and rhythm. 2+ bilateral radial and dorsalis pedis  pulses. Pulm:Clear to auscultation bilaterally. Normal work of breathing on room air. Abdomen: Soft, non-tender, non-distended, positive bowel sounds. FDX:Wzhjupcz for extremity edema. Skin:Warm and dry. Neuro:Alert and oriented x3.  All 4 extremities neurovascularly intact.  Labs: CBC    Component Value Date/Time   WBC 3.5 (L) 05/29/2024 1015   RBC 3.77 (L) 05/29/2024 1015   HGB 11.4 (L) 05/29/2024 1015   HGB 12.4 08/31/2022 1000   HCT 34.8 (L) 05/29/2024 1015   PLT 199 05/29/2024 1015   PLT 208 08/31/2022 1000   MCV 92.3 05/29/2024 1015   MCH 30.2 05/29/2024 1015   MCHC 32.8 05/29/2024 1015   RDW 14.3 05/29/2024 1015   LYMPHSABS 1.2 08/31/2022 1000   MONOABS 0.3 08/31/2022 1000   EOSABS 0.1 08/31/2022 1000   BASOSABS 0.0 08/31/2022 1000     CMP     Component Value Date/Time   NA 138 05/29/2024 1015   NA 144 06/03/2021 0939   K 4.1 05/29/2024 1015   CL 104 05/29/2024 1015   CO2 25  05/29/2024 1015   GLUCOSE 102 (H) 05/29/2024 1015   BUN 15 05/29/2024 1015   BUN 12 06/03/2021 0939   CREATININE 0.85 05/29/2024 1015   CREATININE 0.97 08/31/2022 1000   CALCIUM  9.0 05/29/2024 1015   PROT 7.0 08/31/2022 1000   PROT 6.5 06/03/2021 0939   ALBUMIN 4.0 08/31/2022 1000   ALBUMIN 4.3 06/03/2021 0939   AST 35 08/31/2022 1000   ALT 31 08/31/2022 1000   ALKPHOS 41 08/31/2022 1000   BILITOT 0.3 08/31/2022 1000   GFRNONAA >60 05/29/2024 1015   GFRNONAA >60 08/31/2022 1000   GFRAA  11/28/2007 0450    >60        The eGFR has been calculated using the MDRD equation. This calculation has not been validated in all clinical    Imaging: CT Head Wo Contrast Result Date: 05/29/2024 CLINICAL DATA:  Fall.  Patient on blood thinners. EXAM: CT HEAD WITHOUT CONTRAST TECHNIQUE: Contiguous axial images were obtained from the base of the skull through the vertex without intravenous contrast. RADIATION DOSE REDUCTION: This exam was performed according to the departmental dose-optimization program which includes automated exposure control, adjustment of the mA and/or kV according to patient size and/or use of iterative reconstruction technique. COMPARISON:  05/21/2007 FINDINGS: Brain: Ventricles, cisterns and other CSF spaces are normal. There is no mass, mass effect, shift of midline structures or acute hemorrhage. No acute infarction. Vascular: No hyperdense vessel or unexpected calcification. Skull: Normal. Negative for fracture or focal lesion. Sinuses/Orbits: No acute finding. Other: None. IMPRESSION: No acute findings. Electronically Signed   By: Toribio Agreste M.D.   On: 05/29/2024 11:43   DG Tibia/Fibula Right Result Date: 05/29/2024 CLINICAL DATA:  Fall.  Right leg pain. EXAM: RIGHT TIBIA AND FIBULA - 2 VIEW COMPARISON:  None Available. FINDINGS: There is no evidence of fracture or other focal bone lesions. Moderate ankle osteoarthritis noted. Soft tissues are unremarkable. IMPRESSION: No  acute findings. Moderate ankle osteoarthritis. Electronically Signed   By: Norleen DELENA Kil M.D.   On: 05/29/2024 11:32   DG FEMUR, MIN 2 VIEWS RIGHT Result Date: 05/29/2024 CLINICAL DATA:  Fall.  Distal femur pain and deformity. EXAM: RIGHT FEMUR 2 VIEWS COMPARISON:  None Available. FINDINGS: Comminuted displaced fracture of the distal femoral metaphysis is seen just above the femoral component of the knee arthroplasty. Moderate lateral and posterior displacement and angulation of the distal fracture fragment is seen. IMPRESSION: Comminuted displaced fracture of the distal femoral metaphysis, just above the femoral component of the knee arthroplasty. Electronically Signed   By: Norleen DELENA Kil M.D.   On: 05/29/2024 11:31   DG Pelvis 1-2 Views Result Date: 05/29/2024 CLINICAL DATA:  Fall.  Pelvic pain. EXAM: PELVIS - 1 VIEW COMPARISON:  None Available. FINDINGS: There is no evidence of pelvic fracture or diastasis. Bilateral sacroiliac degenerative changes are noted. Moderate bilateral hip osteoarthritis is seen. Advanced lower lumbar spine degenerative changes also noted. IMPRESSION: No evidence of pelvic fracture or other acute findings. Electronically Signed   By: Norleen DELENA Kil M.D.   On: 05/29/2024 11:29     EKG: personally reviewed my interpretation is sinus rhythm. Consistent with prior EKG.  ASSESSMENT & PLAN:   Assessment & Plan by Problem: Principal Problem:   Femur fracture (HCC) Active Problems:   COPD with chronic bronchitis and emphysema (HCC)   Osteoarthritis   Hypertension   DM (diabetes mellitus) type 2, uncontrolled, with ketoacidosis (HCC)   Current long-term use of anticoagulant medication with history of deep venous thrombosis (DVT)   Normocytic anemia   Fall   JAZMA PICKEL is a 69 y.o. female with pertinent PMH of hypertension, COPD, type 2 diabetes, prior DVT/PE on anticoagulation, breast cancer s/p radiation and anti-estrogen therapy who presented with right leg pain  after a fall and is admitted for comminuted and displaced right femur fracture.  Right femur fracture, comminuted displaced Mechanical fall Remains neurovascularly intact.  Orthopedics consulted in the ED and plan to operate after Xarelto  washout, maybe on Friday.  In the meantime we will control her pain, hold therapeutic anticoagulation and she will remain nonweightbearing with a knee immobilizer. - Hydrocodone /acetaminophen  5/325 mg every 4 hours as needed - Tylenol  650 mg every 8 hours, celecoxib  100 mg daily, Voltaren  gel, duloxetine  60 mg twice daily - Knee immobilizer - Nonweightbearing  Normocytic anemia Hemoglobin 11.4 which is stable from August 2025.  No previous iron studies done so we will get those done here.  No signs of active bleeding.  As above holding anticoagulation. - Iron panel - CBC tomorrow  Hypertension Although pharmacy fill history does not support this she appears to be on amlodipine , hydralazine, losartan , and Lasix as needed.  She does have a good fill history for amlodipine  and Lasix.  Mildly hypertensive here. - Resume amlodipine  10 mg daily and losartan  50 mg daily, may increase to home dose of 100 mg daily if blood pressure still high tomorrow - If needed add back hydralazine but this may be better as a 3 times daily medication instead of once daily as reported  Diet: Carb-Modified VTE: Enoxaparin  Code: Full  Dispo: Admit patient to Inpatient with expected length of stay greater than 2 midnights.  Signed: Fairy Pool, DO Internal Medicine Resident, PGY-3 Please contact the on call pager at [phone] for any urgent or emergent needs. 3:00 PM 05/29/2024 "

## 2024-05-29 NOTE — ED Triage Notes (Signed)
 [facility] Pt had mechanical fall getting out of her car. Pt fell on right side. Pt denies feeling a pop. Ems noted crepitus in right knee. Pt reports pain in right knee . Pt takes xerelto . Did not hit head. No loc. Pt denies neck and back pain. Pt reports not feeling her right leg.  Ems vitals  138/56 Hr 82 Spo2 99% Rr 20  Cbg 124

## 2024-05-29 NOTE — H&P (View-Only) (Signed)
 Reason for Consult:Right distal femur fx Referring Physician: Juliene [name] Time called: 1123 Time at bedside: 1202   [name] is an 69 y.o. female.  HPI: [name] got a cortisone shot in her hip this morning. As the day went on it felt more and more like her leg wasn't there. As she was leaving a different doctor's office her leg gave way and she fell. She had immediate right knee pain and could not get up. She was brought to the ED where x-rays showed a distal femur fx and orthopedic surgery was consulted. She lives at home with a friend and uses a cane for ambulation.  Past Medical History:  Diagnosis Date   Asthma    Breast cancer (HCC) 08/23/2022   Bronchitis, asthmatic    COPD (chronic obstructive pulmonary disease) (HCC)    Coronary artery calcification seen on CT scan    Diabetes (HCC)    DVT (deep venous thrombosis) (HCC)    Family history of kidney cancer    Family history of ovarian cancer    Family history of prostate cancer    Hyperlipidemia    Hypertension    Observed sleep apnea     Past Surgical History:  Procedure Laterality Date   ABDOMINAL HYSTERECTOMY     BREAST BIOPSY Left 08/23/2022   MM LT BREAST BX W LOC DEV 1ST LESION IMAGE BX SPEC STEREO GUIDE 08/23/2022 [facility]   BREAST BIOPSY Left 08/23/2022   MM LT BREAST BX W LOC DEV EA AD LESION IMG BX SPEC STEREO GUIDE 08/23/2022 [facility]   BREAST BIOPSY  11/22/2022   MM LT RADIOACTIVE SEED EA ADD LESION LOC MAMMO GUIDE 11/22/2022 [facility]   BREAST BIOPSY  11/22/2022   MM LT RADIOACTIVE SEED LOC MAMMO GUIDE 11/22/2022 [facility]   BREAST LUMPECTOMY WITH RADIOACTIVE SEED LOCALIZATION Left 11/23/2022   Procedure: LEFT BREAST SEED BRACKETED CENTRAL LUMPECTOMY AND NIPPLE RESECTION;  Surgeon: Vanderbilt Ned, MD;  Location: [facility];  Service: General;  Laterality: Left;   CARDIAC CATHETERIZATION     1996   CHOLECYSTECTOMY N/A 12/14/2023   Procedure:  LAPAROSCOPIC CHOLECYSTECTOMY;  Surgeon: Vanderbilt Ned, MD;  Location: [facility];  Service: General;  Laterality: N/A;   INDOCYANINE GREEN  FLUORESCENCE IMAGING (ICG) N/A 12/14/2023   Procedure: INDOCYANINE GREEN  FLUORESCENCE IMAGING (ICG);  Surgeon: Vanderbilt Ned, MD;  Location: [facility] OR;  Service: General;  Laterality: N/A;   KNEE ARTHROSCOPY Bilateral    TOTAL KNEE ARTHROPLASTY Bilateral     Family History  Problem Relation Age of Onset   Pulmonary embolism Mother    Heart Problems Mother    Hypertension Mother    Heart attack Father    Diabetes Father    Pulmonary embolism Sister    Healthy Sister    Pulmonary embolism Brother    Prostate cancer Brother 30   Kidney cancer Brother 43   Pulmonary embolism Brother    Ovarian cancer Maternal Aunt     Social History:  reports that she has been smoking cigarettes. She started smoking about 60 years ago. She has a 90 pack-year smoking history. She has been exposed to tobacco smoke. She has never used smokeless tobacco. She reports current alcohol use. She reports that she does not currently use drugs after having used the following drugs: Marijuana and Crack cocaine.  Allergies: Allergies[1]  Medications: I have reviewed the patient's current medications.  No results found for this or any previous visit (from the past 48 hours).  CT Head Wo Contrast Result Date: 05/29/2024 CLINICAL DATA:  Fall.  Patient on blood thinners. EXAM: CT HEAD WITHOUT CONTRAST TECHNIQUE: Contiguous axial images were obtained from the base of the skull through the vertex without intravenous contrast. RADIATION DOSE REDUCTION: This exam was performed according to the departmental dose-optimization program which includes automated exposure control, adjustment of the mA and/or kV according to patient size and/or use of iterative reconstruction technique. COMPARISON:  05/21/2007 FINDINGS: Brain: Ventricles, cisterns and other CSF spaces are normal. There is no mass, mass  effect, shift of midline structures or acute hemorrhage. No acute infarction. Vascular: No hyperdense vessel or unexpected calcification. Skull: Normal. Negative for fracture or focal lesion. Sinuses/Orbits: No acute finding. Other: None. IMPRESSION: No acute findings. Electronically Signed   By: Toribio Agreste M.D.   On: 05/29/2024 11:43   DG Tibia/Fibula Right Result Date: 05/29/2024 CLINICAL DATA:  Fall.  Right leg pain. EXAM: RIGHT TIBIA AND FIBULA - 2 VIEW COMPARISON:  None Available. FINDINGS: There is no evidence of fracture or other focal bone lesions. Moderate ankle osteoarthritis noted. Soft tissues are unremarkable. IMPRESSION: No acute findings. Moderate ankle osteoarthritis. Electronically Signed   By: Norleen DELENA Kil M.D.   On: 05/29/2024 11:32   DG FEMUR, MIN 2 VIEWS RIGHT Result Date: 05/29/2024 CLINICAL DATA:  Fall.  Distal femur pain and deformity. EXAM: RIGHT FEMUR 2 VIEWS COMPARISON:  None Available. FINDINGS: Comminuted displaced fracture of the distal femoral metaphysis is seen just above the femoral component of the knee arthroplasty. Moderate lateral and posterior displacement and angulation of the distal fracture fragment is seen. IMPRESSION: Comminuted displaced fracture of the distal femoral metaphysis, just above the femoral component of the knee arthroplasty. Electronically Signed   By: Norleen DELENA Kil M.D.   On: 05/29/2024 11:31   DG Pelvis 1-2 Views Result Date: 05/29/2024 CLINICAL DATA:  Fall.  Pelvic pain. EXAM: PELVIS - 1 VIEW COMPARISON:  None Available. FINDINGS: There is no evidence of pelvic fracture or diastasis. Bilateral sacroiliac degenerative changes are noted. Moderate bilateral hip osteoarthritis is seen. Advanced lower lumbar spine degenerative changes also noted. IMPRESSION: No evidence of pelvic fracture or other acute findings. Electronically Signed   By: Norleen DELENA Kil M.D.   On: 05/29/2024 11:29    Review of Systems  HENT:  Negative for ear discharge, ear pain,  hearing loss and tinnitus.   Eyes:  Negative for photophobia and pain.  Respiratory:  Negative for cough and shortness of breath.   Cardiovascular:  Negative for chest pain.  Gastrointestinal:  Negative for abdominal pain, nausea and vomiting.  Genitourinary:  Negative for dysuria, flank pain, frequency and urgency.  Musculoskeletal:  Positive for arthralgias (Right knee). Negative for back pain, myalgias and neck pain.  Neurological:  Negative for dizziness and headaches.  Hematological:  Does not bruise/bleed easily.  Psychiatric/Behavioral:  The patient is not nervous/anxious.    Blood pressure (!) 160/73, pulse 76, temperature (!) 97.5 F (36.4 C), temperature source Oral, resp. rate 18, height 5' 2 (1.575 m), weight 83.9 kg, SpO2 100%. Physical Exam Constitutional:      General: She is not in acute distress.    Appearance: She is well-developed. She is not diaphoretic.  HENT:     Head: Normocephalic and atraumatic.  Eyes:     General: No scleral icterus.       Right eye: No discharge.        Left eye: No discharge.     Conjunctiva/sclera: Conjunctivae normal.  Cardiovascular:  Rate and Rhythm: Normal rate and regular rhythm.  Pulmonary:     Effort: Pulmonary effort is normal. No respiratory distress.  Musculoskeletal:     Cervical back: Normal range of motion.     Comments: RLE No traumatic wounds, ecchymosis, or rash  Mod TTP knee  No ankle effusion  Sens DPN, SPN, TN intact  Motor EHL, ext, flex, evers 5/5  DP 2+, PT 2+, 2+ NP edema  Skin:    General: Skin is warm and dry.  Neurological:     Mental Status: She is alert.  Psychiatric:        Mood and Affect: Mood normal.        Behavior: Behavior normal.     Assessment/Plan: Right periprosthetic distal femur fx -- Plan ORIF tomorrow vs Friday. Please hold Xarelto . Will make NPO after MN and reassess in AM.    Ozell DOROTHA Ned, PA-C Orthopedic Surgery [phone] 05/29/2024, 12:10 PM      [1]   Allergies Allergen Reactions   Codeine Itching   Tramadol Nausea And Vomiting    Feels overly intoxicated

## 2024-05-29 NOTE — Consult Note (Signed)
 Reason for Consult:Right distal femur fx Referring Physician: Juliene Bicker Time called: 1123 Time at bedside: 1202   [name] is an 69 y.o. female.  HPI: [name] got a cortisone shot in her hip this morning. As the day went on it felt more and more like her leg wasn't there. As she was leaving a different doctor's office her leg gave way and she fell. She had immediate right knee pain and could not get up. She was brought to the ED where x-rays showed a distal femur fx and orthopedic surgery was consulted. She lives at home with a friend and uses a cane for ambulation.  Past Medical History:  Diagnosis Date   Asthma    Breast cancer (HCC) 08/23/2022   Bronchitis, asthmatic    COPD (chronic obstructive pulmonary disease) (HCC)    Coronary artery calcification seen on CT scan    Diabetes (HCC)    DVT (deep venous thrombosis) (HCC)    Family history of kidney cancer    Family history of ovarian cancer    Family history of prostate cancer    Hyperlipidemia    Hypertension    Observed sleep apnea     Past Surgical History:  Procedure Laterality Date   ABDOMINAL HYSTERECTOMY     BREAST BIOPSY Left 08/23/2022   MM LT BREAST BX W LOC DEV 1ST LESION IMAGE BX SPEC STEREO GUIDE 08/23/2022 [facility]   BREAST BIOPSY Left 08/23/2022   MM LT BREAST BX W LOC DEV EA AD LESION IMG BX SPEC STEREO GUIDE 08/23/2022 [facility]   BREAST BIOPSY  11/22/2022   MM LT RADIOACTIVE SEED EA ADD LESION LOC MAMMO GUIDE 11/22/2022 [facility]   BREAST BIOPSY  11/22/2022   MM LT RADIOACTIVE SEED LOC MAMMO GUIDE 11/22/2022 [facility]   BREAST LUMPECTOMY WITH RADIOACTIVE SEED LOCALIZATION Left 11/23/2022   Procedure: LEFT BREAST SEED BRACKETED CENTRAL LUMPECTOMY AND NIPPLE RESECTION;  Surgeon: Vanderbilt Ned, MD;  Location: [facility];  Service: General;  Laterality: Left;   CARDIAC CATHETERIZATION     1996   CHOLECYSTECTOMY N/A 12/14/2023   Procedure:  LAPAROSCOPIC CHOLECYSTECTOMY;  Surgeon: Vanderbilt Ned, MD;  Location: [facility];  Service: General;  Laterality: N/A;   INDOCYANINE GREEN  FLUORESCENCE IMAGING (ICG) N/A 12/14/2023   Procedure: INDOCYANINE GREEN  FLUORESCENCE IMAGING (ICG);  Surgeon: Vanderbilt Ned, MD;  Location: [facility];  Service: General;  Laterality: N/A;   KNEE ARTHROSCOPY Bilateral    TOTAL KNEE ARTHROPLASTY Bilateral     Family History  Problem Relation Age of Onset   Pulmonary embolism Mother    Heart Problems Mother    Hypertension Mother    Heart attack Father    Diabetes Father    Pulmonary embolism Sister    Healthy Sister    Pulmonary embolism Brother    Prostate cancer Brother 66   Kidney cancer Brother 63   Pulmonary embolism Brother    Ovarian cancer Maternal Aunt     Social History:  reports that she has been smoking cigarettes. She started smoking about 60 years ago. She has a 90 pack-year smoking history. She has been exposed to tobacco smoke. She has never used smokeless tobacco. She reports current alcohol use. She reports that she does not currently use drugs after having used the following drugs: Marijuana and Crack cocaine.  Allergies: Allergies[1]  Medications: I have reviewed the patient's current medications.  No results found for this or any previous visit (from the past 48 hours).  CT Head Wo Contrast Result Date: 05/29/2024 CLINICAL DATA:  Fall.  Patient on blood thinners. EXAM: CT HEAD WITHOUT CONTRAST TECHNIQUE: Contiguous axial images were obtained from the base of the skull through the vertex without intravenous contrast. RADIATION DOSE REDUCTION: This exam was performed according to the departmental dose-optimization program which includes automated exposure control, adjustment of the mA and/or kV according to patient size and/or use of iterative reconstruction technique. COMPARISON:  05/21/2007 FINDINGS: Brain: Ventricles, cisterns and other CSF spaces are normal. There is no mass, mass  effect, shift of midline structures or acute hemorrhage. No acute infarction. Vascular: No hyperdense vessel or unexpected calcification. Skull: Normal. Negative for fracture or focal lesion. Sinuses/Orbits: No acute finding. Other: None. IMPRESSION: No acute findings. Electronically Signed   By: Toribio Agreste M.D.   On: 05/29/2024 11:43   DG Tibia/Fibula Right Result Date: 05/29/2024 CLINICAL DATA:  Fall.  Right leg pain. EXAM: RIGHT TIBIA AND FIBULA - 2 VIEW COMPARISON:  None Available. FINDINGS: There is no evidence of fracture or other focal bone lesions. Moderate ankle osteoarthritis noted. Soft tissues are unremarkable. IMPRESSION: No acute findings. Moderate ankle osteoarthritis. Electronically Signed   By: Norleen DELENA Kil M.D.   On: 05/29/2024 11:32   DG FEMUR, MIN 2 VIEWS RIGHT Result Date: 05/29/2024 CLINICAL DATA:  Fall.  Distal femur pain and deformity. EXAM: RIGHT FEMUR 2 VIEWS COMPARISON:  None Available. FINDINGS: Comminuted displaced fracture of the distal femoral metaphysis is seen just above the femoral component of the knee arthroplasty. Moderate lateral and posterior displacement and angulation of the distal fracture fragment is seen. IMPRESSION: Comminuted displaced fracture of the distal femoral metaphysis, just above the femoral component of the knee arthroplasty. Electronically Signed   By: Norleen DELENA Kil M.D.   On: 05/29/2024 11:31   DG Pelvis 1-2 Views Result Date: 05/29/2024 CLINICAL DATA:  Fall.  Pelvic pain. EXAM: PELVIS - 1 VIEW COMPARISON:  None Available. FINDINGS: There is no evidence of pelvic fracture or diastasis. Bilateral sacroiliac degenerative changes are noted. Moderate bilateral hip osteoarthritis is seen. Advanced lower lumbar spine degenerative changes also noted. IMPRESSION: No evidence of pelvic fracture or other acute findings. Electronically Signed   By: Norleen DELENA Kil M.D.   On: 05/29/2024 11:29    Review of Systems  HENT:  Negative for ear discharge, ear pain,  hearing loss and tinnitus.   Eyes:  Negative for photophobia and pain.  Respiratory:  Negative for cough and shortness of breath.   Cardiovascular:  Negative for chest pain.  Gastrointestinal:  Negative for abdominal pain, nausea and vomiting.  Genitourinary:  Negative for dysuria, flank pain, frequency and urgency.  Musculoskeletal:  Positive for arthralgias (Right knee). Negative for back pain, myalgias and neck pain.  Neurological:  Negative for dizziness and headaches.  Hematological:  Does not bruise/bleed easily.  Psychiatric/Behavioral:  The patient is not nervous/anxious.    Blood pressure (!) 160/73, pulse 76, temperature (!) 97.5 F (36.4 C), temperature source Oral, resp. rate 18, height 5' 2 (1.575 m), weight 83.9 kg, SpO2 100%. Physical Exam Constitutional:      General: She is not in acute distress.    Appearance: She is well-developed. She is not diaphoretic.  HENT:     Head: Normocephalic and atraumatic.  Eyes:     General: No scleral icterus.       Right eye: No discharge.        Left eye: No discharge.     Conjunctiva/sclera: Conjunctivae normal.  Cardiovascular:  Rate and Rhythm: Normal rate and regular rhythm.  Pulmonary:     Effort: Pulmonary effort is normal. No respiratory distress.  Musculoskeletal:     Cervical back: Normal range of motion.     Comments: RLE No traumatic wounds, ecchymosis, or rash  Mod TTP knee  No ankle effusion  Sens DPN, SPN, TN intact  Motor EHL, ext, flex, evers 5/5  DP 2+, PT 2+, 2+ NP edema  Skin:    General: Skin is warm and dry.  Neurological:     Mental Status: She is alert.  Psychiatric:        Mood and Affect: Mood normal.        Behavior: Behavior normal.     Assessment/Plan: Right periprosthetic distal femur fx -- Plan ORIF tomorrow vs Friday. Please hold Xarelto . Will make NPO after MN and reassess in AM.    Ozell DOROTHA Ned, PA-C Orthopedic Surgery [phone] 05/29/2024, 12:10 PM      [1]   Allergies Allergen Reactions   Codeine Itching   Tramadol Nausea And Vomiting    Feels overly intoxicated

## 2024-05-30 ENCOUNTER — Encounter (HOSPITAL_COMMUNITY)
Admission: EM | Disposition: A | Discharge status: Skilled Nursing Facility | Payer: Self-pay | Source: Home / Self Care | Attending: Internal Medicine

## 2024-05-30 ENCOUNTER — Inpatient Hospital Stay (HOSPITAL_COMMUNITY)

## 2024-05-30 ENCOUNTER — Inpatient Hospital Stay (HOSPITAL_COMMUNITY): Admitting: Certified Registered Nurse Anesthetist

## 2024-05-30 ENCOUNTER — Other Ambulatory Visit (HOSPITAL_COMMUNITY): Payer: Self-pay

## 2024-05-30 ENCOUNTER — Encounter (HOSPITAL_COMMUNITY): Payer: Self-pay | Admitting: Internal Medicine

## 2024-05-30 DIAGNOSIS — Z86718 Personal history of other venous thrombosis and embolism: Secondary | ICD-10-CM | POA: Diagnosis not present

## 2024-05-30 DIAGNOSIS — D649 Anemia, unspecified: Secondary | ICD-10-CM

## 2024-05-30 DIAGNOSIS — S72351A Displaced comminuted fracture of shaft of right femur, initial encounter for closed fracture: Secondary | ICD-10-CM

## 2024-05-30 DIAGNOSIS — F17210 Nicotine dependence, cigarettes, uncomplicated: Secondary | ICD-10-CM | POA: Diagnosis not present

## 2024-05-30 DIAGNOSIS — E119 Type 2 diabetes mellitus without complications: Secondary | ICD-10-CM | POA: Diagnosis not present

## 2024-05-30 DIAGNOSIS — K8681 Exocrine pancreatic insufficiency: Secondary | ICD-10-CM | POA: Diagnosis not present

## 2024-05-30 DIAGNOSIS — W19XXXA Unspecified fall, initial encounter: Secondary | ICD-10-CM

## 2024-05-30 DIAGNOSIS — I10 Essential (primary) hypertension: Secondary | ICD-10-CM

## 2024-05-30 DIAGNOSIS — I2510 Atherosclerotic heart disease of native coronary artery without angina pectoris: Secondary | ICD-10-CM | POA: Diagnosis not present

## 2024-05-30 DIAGNOSIS — M9711XA Periprosthetic fracture around internal prosthetic right knee joint, initial encounter: Secondary | ICD-10-CM

## 2024-05-30 DIAGNOSIS — J449 Chronic obstructive pulmonary disease, unspecified: Secondary | ICD-10-CM | POA: Diagnosis not present

## 2024-05-30 LAB — VITAMIN D 25 HYDROXY (VIT D DEFICIENCY, FRACTURES): Vit D, 25-Hydroxy: 57.2 ng/mL (ref 30–100)

## 2024-05-30 LAB — BASIC METABOLIC PANEL WITH GFR
Anion gap: 11 (ref 5–15)
BUN: 11 mg/dL (ref 8–23)
CO2: 23 mmol/L (ref 22–32)
Calcium: 9.2 mg/dL (ref 8.9–10.3)
Chloride: 101 mmol/L (ref 98–111)
Creatinine, Ser: 0.69 mg/dL (ref 0.44–1.00)
GFR, Estimated: >60 mL/min
Glucose, Bld: 116 mg/dL — ABNORMAL HIGH (ref 70–99)
Potassium: 3.9 mmol/L (ref 3.5–5.1)
Sodium: 135 mmol/L (ref 135–145)

## 2024-05-30 LAB — CBC
HCT: 32.9 % — ABNORMAL LOW (ref 36.0–46.0)
Hemoglobin: 11.3 g/dL — ABNORMAL LOW (ref 12.0–15.0)
MCH: 30.8 pg (ref 26.0–34.0)
MCHC: 34.3 g/dL (ref 30.0–36.0)
MCV: 89.6 fL (ref 80.0–100.0)
Platelets: 184 K/uL (ref 150–400)
RBC: 3.67 MIL/uL — ABNORMAL LOW (ref 3.87–5.11)
RDW: 13.7 % (ref 11.5–15.5)
WBC: 5.4 K/uL (ref 4.0–10.5)
nRBC: 0 % (ref 0.0–0.2)

## 2024-05-30 LAB — GLUCOSE, CAPILLARY
Glucose-Capillary: 129 mg/dL — ABNORMAL HIGH (ref 70–99)
Glucose-Capillary: 117 mg/dL — ABNORMAL HIGH (ref 70–99)
Glucose-Capillary: 135 mg/dL — ABNORMAL HIGH (ref 70–99)
Glucose-Capillary: 133 mg/dL — ABNORMAL HIGH (ref 70–99)
Glucose-Capillary: 190 mg/dL — ABNORMAL HIGH (ref 70–99)

## 2024-05-30 LAB — IRON AND TIBC
Iron: 55 ug/dL (ref 28–170)
Saturation Ratios: 17 % (ref 10.4–31.8)
TIBC: 329 ug/dL (ref 250–450)
UIBC: 274 ug/dL

## 2024-05-30 LAB — FERRITIN: Ferritin: 103 ng/mL (ref 11–307)

## 2024-05-30 LAB — HIV ANTIBODY (ROUTINE TESTING W REFLEX): HIV Screen 4th Generation wRfx: NONREACTIVE

## 2024-05-30 MED ORDER — VANCOMYCIN HCL 1000 MG IV SOLR
INTRAVENOUS | Status: DC | PRN
  Administered 2024-05-30: 1000 mg via TOPICAL

## 2024-05-30 MED ORDER — ACETAMINOPHEN 325 MG PO TABS: 650.0000 mg | ORAL_TABLET | Freq: Four times a day (QID) | ORAL | Status: DC | PRN

## 2024-05-30 MED ORDER — METOCLOPRAMIDE HCL 5 MG PO TABS: 5.0000 mg | ORAL_TABLET | Freq: Three times a day (TID) | ORAL | Status: DC | PRN

## 2024-05-30 MED ORDER — ONDANSETRON HCL 4 MG/2ML IJ SOLN
INTRAMUSCULAR | Status: AC
  Filled 2024-05-30: qty 2

## 2024-05-30 MED ORDER — ROCURONIUM BROMIDE 10 MG/ML (PF) SYRINGE
PREFILLED_SYRINGE | INTRAVENOUS | Status: DC | PRN
  Administered 2024-05-30: 20 mg via INTRAVENOUS
  Administered 2024-05-30: 30 mg via INTRAVENOUS

## 2024-05-30 MED ORDER — FENTANYL CITRATE (PF) 250 MCG/5ML IJ SOLN
INTRAMUSCULAR | Status: DC | PRN
  Administered 2024-05-30 (×2): 50 ug via INTRAVENOUS

## 2024-05-30 MED ORDER — LIDOCAINE 2% (20 MG/ML) 5 ML SYRINGE
INTRAMUSCULAR | Status: DC | PRN
  Administered 2024-05-30: 100 mg via INTRAVENOUS

## 2024-05-30 MED ORDER — DEXAMETHASONE SOD PHOSPHATE PF 10 MG/ML IJ SOLN
INTRAMUSCULAR | Status: DC | PRN
  Administered 2024-05-30: 5 mg via INTRAVENOUS

## 2024-05-30 MED ORDER — HYDROMORPHONE HCL 1 MG/ML IJ SOLN
0.5000 mg | INTRAMUSCULAR | Status: DC | PRN
  Administered 2024-06-01: 0.5 mg via INTRAVENOUS
  Filled 2024-05-30: qty 0.5

## 2024-05-30 MED ORDER — MIDAZOLAM HCL 2 MG/2ML IJ SOLN
INTRAMUSCULAR | Status: AC
  Filled 2024-05-30: qty 2

## 2024-05-30 MED ORDER — MUPIROCIN 2 % EX OINT
1.0000 | TOPICAL_OINTMENT | Freq: Two times a day (BID) | CUTANEOUS | 0 refills | Status: AC
  Filled 2024-05-30: qty 22, 30d supply, fill #0

## 2024-05-30 MED ORDER — PROPOFOL 10 MG/ML IV BOLUS
INTRAVENOUS | Status: AC
  Filled 2024-05-30: qty 20

## 2024-05-30 MED ORDER — ACETAMINOPHEN 10 MG/ML IV SOLN
1000.0000 mg | Freq: Once | INTRAVENOUS | Status: DC | PRN
  Administered 2024-05-30: 1000 mg via INTRAVENOUS

## 2024-05-30 MED ORDER — CHLORHEXIDINE GLUCONATE 4 % EX SOLN
1.0000 | CUTANEOUS | 1 refills | Status: AC
  Filled 2024-05-30: qty 946, 30d supply, fill #0

## 2024-05-30 MED ORDER — LIDOCAINE 2% (20 MG/ML) 5 ML SYRINGE
INTRAMUSCULAR | Status: AC
  Filled 2024-05-30: qty 5

## 2024-05-30 MED ORDER — ONDANSETRON HCL 4 MG/2ML IJ SOLN: 4.0000 mg | Freq: Four times a day (QID) | INTRAMUSCULAR | Status: DC | PRN

## 2024-05-30 MED ORDER — ONDANSETRON HCL 4 MG/2ML IJ SOLN
INTRAMUSCULAR | Status: DC | PRN
  Administered 2024-05-30: 4 mg via INTRAVENOUS

## 2024-05-30 MED ORDER — SODIUM CHLORIDE 0.9 % IV SOLN: INTRAVENOUS | Status: AC

## 2024-05-30 MED ORDER — ONDANSETRON HCL 4 MG/2ML IJ SOLN: 4.0000 mg | Freq: Once | INTRAMUSCULAR | Status: DC | PRN

## 2024-05-30 MED ORDER — HYDROMORPHONE HCL 1 MG/ML IJ SOLN
0.2500 mg | INTRAMUSCULAR | Status: DC | PRN
  Administered 2024-05-30 (×2): 0.25 mg via INTRAVENOUS

## 2024-05-30 MED ORDER — METHOCARBAMOL 1000 MG/10ML IJ SOLN: 500.0000 mg | Freq: Three times a day (TID) | INTRAMUSCULAR | Status: DC | PRN

## 2024-05-30 MED ORDER — MIDAZOLAM HCL (PF) 2 MG/2ML IJ SOLN
INTRAMUSCULAR | Status: DC | PRN
  Administered 2024-05-30 (×2): 1 mg via INTRAVENOUS

## 2024-05-30 MED ORDER — FOLIC ACID 1 MG PO TABS: 1.0000 mg | ORAL_TABLET | Freq: Every day | ORAL | Status: DC

## 2024-05-30 MED ORDER — CEFAZOLIN SODIUM-DEXTROSE 2-4 GM/100ML-% IV SOLN
INTRAVENOUS | Status: AC
  Filled 2024-05-30: qty 100

## 2024-05-30 MED ORDER — FERROUS SULFATE 325 (65 FE) MG PO TBEC: 325.0000 mg | DELAYED_RELEASE_TABLET | Freq: Every day | ORAL | Status: DC

## 2024-05-30 MED ORDER — SUCCINYLCHOLINE CHLORIDE 200 MG/10ML IV SOSY
PREFILLED_SYRINGE | INTRAVENOUS | Status: AC
  Filled 2024-05-30: qty 10

## 2024-05-30 MED ORDER — DOCUSATE SODIUM 100 MG PO CAPS
100.0000 mg | ORAL_CAPSULE | Freq: Two times a day (BID) | ORAL | Status: DC
  Administered 2024-05-30 – 2024-06-04 (×9): 100 mg via ORAL
  Filled 2024-05-30 (×9): qty 1

## 2024-05-30 MED ORDER — ORAL CARE MOUTH RINSE: 15.0000 mL | Freq: Once | OROMUCOSAL | Status: DC

## 2024-05-30 MED ORDER — AMISULPRIDE (ANTIEMETIC) 5 MG/2ML IV SOLN: 10.0000 mg | Freq: Once | INTRAVENOUS | Status: DC | PRN

## 2024-05-30 MED ORDER — RIVAROXABAN 20 MG PO TABS
20.0000 mg | ORAL_TABLET | Freq: Every day | ORAL | Status: DC
  Administered 2024-05-31 – 2024-06-04 (×5): 20 mg via ORAL
  Filled 2024-05-30 (×6): qty 1

## 2024-05-30 MED ORDER — PROPOFOL 500 MG/50ML IV EMUL
INTRAVENOUS | Status: DC | PRN
  Administered 2024-05-30: 150 ug/kg/min via INTRAVENOUS

## 2024-05-30 MED ORDER — HYDROCODONE-ACETAMINOPHEN 5-325 MG PO TABS
1.0000 | ORAL_TABLET | ORAL | Status: DC | PRN
  Administered 2024-05-30 – 2024-06-02 (×10): 2 via ORAL
  Administered 2024-06-03 (×2): 1 via ORAL
  Administered 2024-06-03 – 2024-06-04 (×3): 2 via ORAL
  Filled 2024-05-30 (×10): qty 2
  Filled 2024-05-30: qty 1
  Filled 2024-05-30 (×3): qty 2
  Filled 2024-05-30: qty 1
  Filled 2024-05-30: qty 2

## 2024-05-30 MED ORDER — SUCCINYLCHOLINE CHLORIDE 200 MG/10ML IV SOSY
PREFILLED_SYRINGE | INTRAVENOUS | Status: DC | PRN
  Administered 2024-05-30: 100 mg via INTRAVENOUS

## 2024-05-30 MED ORDER — CHLORHEXIDINE GLUCONATE 0.12 % MT SOLN: 15.0000 mL | Freq: Once | OROMUCOSAL | Status: DC

## 2024-05-30 MED ORDER — HYDROMORPHONE HCL 1 MG/ML IJ SOLN
INTRAMUSCULAR | Status: AC
  Filled 2024-05-30: qty 1

## 2024-05-30 MED ORDER — PHENYLEPHRINE 80 MCG/ML (10ML) SYRINGE FOR IV PUSH (FOR BLOOD PRESSURE SUPPORT)
PREFILLED_SYRINGE | INTRAVENOUS | Status: AC
  Filled 2024-05-30: qty 10

## 2024-05-30 MED ORDER — 0.9 % SODIUM CHLORIDE (POUR BTL) OPTIME
TOPICAL | Status: DC | PRN
  Administered 2024-05-30: 1000 mL

## 2024-05-30 MED ORDER — PROPOFOL 10 MG/ML IV BOLUS
INTRAVENOUS | Status: DC | PRN
  Administered 2024-05-30: 70 mg via INTRAVENOUS

## 2024-05-30 MED ORDER — DEXAMETHASONE SOD PHOSPHATE PF 10 MG/ML IJ SOLN
INTRAMUSCULAR | Status: AC
  Filled 2024-05-30: qty 1

## 2024-05-30 MED ORDER — METHOCARBAMOL 500 MG PO TABS
500.0000 mg | ORAL_TABLET | Freq: Three times a day (TID) | ORAL | Status: DC | PRN
  Administered 2024-05-30 – 2024-06-04 (×9): 500 mg via ORAL
  Filled 2024-05-30 (×9): qty 1

## 2024-05-30 MED ORDER — CEFAZOLIN SODIUM-DEXTROSE 2-3 GM-%(50ML) IV SOLR
INTRAVENOUS | Status: DC | PRN
  Administered 2024-05-30: 2 g via INTRAVENOUS

## 2024-05-30 MED ORDER — CEFAZOLIN SODIUM-DEXTROSE 2-4 GM/100ML-% IV SOLN
2.0000 g | Freq: Three times a day (TID) | INTRAVENOUS | Status: AC
  Administered 2024-05-30 – 2024-05-31 (×3): 2 g via INTRAVENOUS
  Filled 2024-05-30 (×3): qty 100

## 2024-05-30 MED ORDER — TRANEXAMIC ACID-NACL 1000-0.7 MG/100ML-% IV SOLN
1000.0000 mg | Freq: Once | INTRAVENOUS | Status: AC
  Administered 2024-05-30: 1000 mg via INTRAVENOUS
  Filled 2024-05-30: qty 100

## 2024-05-30 MED ORDER — OXYCODONE HCL 5 MG/5ML PO SOLN: 5.0000 mg | Freq: Once | ORAL | Status: DC | PRN

## 2024-05-30 MED ORDER — LACTATED RINGERS IV SOLN: INTRAVENOUS | Status: DC

## 2024-05-30 MED ORDER — SUGAMMADEX SODIUM 200 MG/2ML IV SOLN
INTRAVENOUS | Status: DC | PRN
  Administered 2024-05-30: 100 mg via INTRAVENOUS

## 2024-05-30 MED ORDER — PHENYLEPHRINE HCL-NACL 20-0.9 MG/250ML-% IV SOLN
INTRAVENOUS | Status: DC | PRN
  Administered 2024-05-30: 160 ug via INTRAVENOUS
  Administered 2024-05-30 (×2): 80 ug via INTRAVENOUS
  Administered 2024-05-30: 40 ug/min via INTRAVENOUS

## 2024-05-30 MED ORDER — ROCURONIUM BROMIDE 10 MG/ML (PF) SYRINGE
PREFILLED_SYRINGE | INTRAVENOUS | Status: AC
  Filled 2024-05-30: qty 10

## 2024-05-30 MED ORDER — FENTANYL CITRATE (PF) 250 MCG/5ML IJ SOLN
INTRAMUSCULAR | Status: AC
  Filled 2024-05-30: qty 5

## 2024-05-30 MED ORDER — POLYETHYLENE GLYCOL 3350 17 G PO PACK: 17.0000 g | PACK | Freq: Every day | ORAL | Status: DC | PRN

## 2024-05-30 MED ORDER — OXYCODONE HCL 5 MG PO TABS: 5.0000 mg | ORAL_TABLET | Freq: Once | ORAL | Status: DC | PRN

## 2024-05-30 MED ORDER — ONDANSETRON HCL 4 MG PO TABS: 4.0000 mg | ORAL_TABLET | Freq: Four times a day (QID) | ORAL | Status: DC | PRN

## 2024-05-30 MED ORDER — METOCLOPRAMIDE HCL 5 MG/ML IJ SOLN: 5.0000 mg | Freq: Three times a day (TID) | INTRAMUSCULAR | Status: DC | PRN

## 2024-05-30 NOTE — Progress Notes (Signed)
 PHARMACY - ANTICOAGULATION CONSULT NOTE  Pharmacy Consult for resuming prior to admission rivaroxaban /Xarelto  20 mg PO every day  Indication: VTE prophylaxis  Allergies[1]  Patient Measurements: Height: 5' 2.5 (158.8 cm) Weight: 83.9 kg (185 lb) IBW/kg (Calculated) : 51.25 HEPARIN DW (KG): 70  Vital Signs: Temp: 99 F (37.2 C) (01/22 1524) Temp Source: Oral (01/22 1201) BP: 129/71 (01/22 1530) Pulse Rate: 89 (01/22 1530)  Labs: Recent Labs    05/29/24 1015 05/30/24 0432  HGB 11.4* 11.3*  HCT 34.8* 32.9*  PLT 199 184  LABPROT 29.2*  --   INR 2.6*  --   CREATININE 0.85 0.69    Estimated Creatinine Clearance: 68.3 mL/min (by C-G formula based on SCr of 0.69 mg/dL).   Medical History: Past Medical History:  Diagnosis Date   Asthma    Breast cancer (HCC) 08/23/2022   Bronchitis, asthmatic    COPD (chronic obstructive pulmonary disease) (HCC)    Coronary artery calcification seen on CT scan    Diabetes (HCC)    DVT (deep venous thrombosis) (HCC)    Family history of kidney cancer    Family history of ovarian cancer    Family history of prostate cancer    Hyperlipidemia    Hypertension    Observed sleep apnea     Medications:  Scheduled:   [MAR Hold] acetaminophen   650 mg Oral Q8H   [MAR Hold] amLODipine   10 mg Oral Daily   [MAR Hold] arformoterol   15 mcg Nebulization BID   And   [MAR Hold] umeclidinium bromide   1 puff Inhalation Daily   chlorhexidine   15 mL Mouth/Throat Once   Or   mouth rinse  15 mL Mouth Rinse Once   [MAR Hold] Chlorhexidine  Gluconate Cloth  6 each Topical Daily   [MAR Hold] DULoxetine   60 mg Oral BID   [MAR Hold] insulin  aspart  0-9 Units Subcutaneous TID WC   [MAR Hold] lipase/protease/amylase  36,000-72,000 Units Oral TID AC   [MAR Hold] losartan   50 mg Oral Daily   [MAR Hold] mupirocin  ointment  1 Application Nasal BID   [MAR Hold] pantoprazole   40 mg Oral Daily   [START ON 05/31/2024] rivaroxaban   20 mg Oral Q1200   [MAR Hold]  rosuvastatin   40 mg Oral Daily    Assessment: 69 year old woman, with Pre-Op Diagnoses: Right periprosthetic supracondylar distal femur fracture. Post-Op Diagnosis:  Same. Procedures: CPT 27511-Open reduction internal fixation of right supracondylar distal femur fracture by Dr. Kendal and assisted by DEVONNA Lauraine Moores. EBL: 200 mL. Pre-op cefazolin  2g administered wit vancomycin  powder placed topically.    Goal of Therapy:  Direct oral anticoagulant, FXa inhibitor, rivaroxaban /Xarelto  20 mg PO every day will commence tomorrow, Friday 23-JAN-26 at ~12:00 noon with her lunch time meal to facilitate maximum absorption as per FDA approved labeling and as was done in the conduct of the clinical trial approving this drug/dose.  Monitor platelets by anticoagulation protocol: Yes   Plan:  Commence rivaroxaban /Xarelto , 20 mg PO every day on Friday 23-JAN-26 at ~12:00 noon as cited above.   Lynwood KATHEE Lites, PharmD, CPP Clinical Pharmacist Practitioner 05/30/2024,3:48 PM      [1]  Allergies Allergen Reactions   Codeine Itching   Tramadol Nausea And Vomiting and Other (See Comments)    Feels overly intoxicated

## 2024-05-30 NOTE — Progress Notes (Signed)
 OT Cancellation Note  Patient Details Name: [name] MRN: [id number] DOB: Mar 18, 1956   Cancelled Treatment:    Reason Eval/Treat Not Completed: (P) Medical issues which prohibited therapy, to OR today, distal femur fx, will check back tomorrow.  Elouise JONELLE Bott 05/30/2024, 12:34 PM

## 2024-05-30 NOTE — Progress Notes (Signed)
 PT Cancellation Note  Patient Details Name: KAIZLEY AJA MRN: [id number] DOB: [birth year]   Cancelled Treatment:    Reason Eval/Treat Not Completed: Patient at procedure or test/unavailable (Pt off floor for hip ORIF. Plan to follow up when able.)  Leontine Hilt DPT Acute Rehab Services [phone] Prefer contact via chat    Leontine NOVAK Jaidyn Usery 05/30/2024, 1:11 PM

## 2024-05-30 NOTE — Interval H&P Note (Signed)
 History and Physical Interval Note:  05/30/2024 12:42 PM  [name]  has presented today for surgery, with the diagnosis of Right distal femur fracture.  The various methods of treatment have been discussed with the patient and family. After consideration of risks, benefits and other options for treatment, the patient has consented to  Procedures: OPEN REDUCTION INTERNAL FIXATION (ORIF) DISTAL FEMUR FRACTURE (Right) as a surgical intervention.  The patient's history has been reviewed, patient examined, no change in status, stable for surgery.  I have reviewed the patient's chart and labs.  Questions were answered to the patient's satisfaction.     [name]

## 2024-05-30 NOTE — Progress Notes (Addendum)
 "  HD#1 SUBJECTIVE:  Patient Summary: [name] is a 69 y.o. with a pertinent PMH of hypertension, COPD, type 2 diabetes, prior DVT/PE on anticoagulation, breast cancer s/p radiation and anti-estrogen therapy who presented with right leg pain after a fall and is admitted for comminuted and displaced right femur fracture.  Overnight Events: none.  Interim History: Patient was seen on rounds resting on her side in bed with her grandson at bedside, she has an ice pack on right knee that is helping. She says her pain has been managed pretty well since admission, it is moderate currently and tolerable. Family is concerned for blood clots as their family has had a previous family member pass away from a post-operative DVT. Discussed our medical decision making and risk mitigation steps we are taking to prevent an event like this from occurring. They voiced understanding, and have no other concerns at this time.  OBJECTIVE:  Vital Signs: Vitals:   05/29/24 2111 05/30/24 0010 05/30/24 0407 05/30/24 0500  BP: (!) 167/86 (!) 162/78 (!) 171/86   Pulse: 87 85 87   Resp: 16 16 19    Temp: 99.5 F (37.5 C) 99.4 F (37.4 C) 99.4 F (37.4 C)   TempSrc: Oral Oral Oral   SpO2: 100% 98% 99%   Weight:    85.6 kg  Height:       Supplemental O2: Room Air SpO2: 99 %  Filed Weights   05/29/24 1009 05/30/24 0500  Weight: 83.9 kg 85.6 kg    Intake/Output Summary (Last 24 hours) at 05/30/2024 0740 Last data filed at 05/30/2024 0408 Gross per 24 hour  Intake 480 ml  Output 1300 ml  Net -820 ml   Net IO Since Admission: -820 mL [05/30/24 0740]  Physical Exam: Physical Exam Constitutional:      Appearance: She is not ill-appearing, toxic-appearing or diaphoretic.  Pulmonary:     Effort: Pulmonary effort is normal.  Musculoskeletal:     Comments: Warm bilat LE, deferred ROM & strength at this time  Skin:    General: Skin is warm and dry.  Neurological:     Mental Status: She is alert.     Patient Lines/Drains/Airways Status     Active Line/Drains/Airways     Name Placement date Placement time Site Days   Peripheral IV 05/29/24 20 G Right Antecubital 05/29/24  1015  Antecubital  1   External Urinary Catheter 05/29/24  1856  --  1           Pertinent labs and imaging:     Latest Ref Rng & Units 05/30/2024    4:32 AM 05/29/2024   10:15 AM 12/11/2023   11:00 AM  CBC  WBC 4.0 - 10.5 K/uL 5.4  3.5  3.6   Hemoglobin 12.0 - 15.0 g/dL 88.6  88.5  88.0   Hematocrit 36.0 - 46.0 % 32.9  34.8  36.9   Platelets 150 - 400 K/uL 184  199  231        Latest Ref Rng & Units 05/30/2024    4:32 AM 05/29/2024   10:15 AM 12/11/2023   11:00 AM  CMP  Glucose 70 - 99 mg/dL 883  897  93   BUN 8 - 23 mg/dL 11  15  9    Creatinine 0.44 - 1.00 mg/dL 9.30  9.14  8.97   Sodium 135 - 145 mmol/L 135  138  144   Potassium 3.5 - 5.1 mmol/L 3.9  4.1  3.3  Chloride 98 - 111 mmol/L 101  104  109   CO2 22 - 32 mmol/L 23  25  26    Calcium  8.9 - 10.3 mg/dL 9.2  9.0  9.1    CT Head Wo Contrast Result Date: 05/29/2024 CLINICAL DATA:  Fall.  Patient on blood thinners. EXAM: CT HEAD WITHOUT CONTRAST TECHNIQUE: Contiguous axial images were obtained from the base of the skull through the vertex without intravenous contrast. RADIATION DOSE REDUCTION: This exam was performed according to the departmental dose-optimization program which includes automated exposure control, adjustment of the mA and/or kV according to patient size and/or use of iterative reconstruction technique. COMPARISON:  05/21/2007 FINDINGS: Brain: Ventricles, cisterns and other CSF spaces are normal. There is no mass, mass effect, shift of midline structures or acute hemorrhage. No acute infarction. Vascular: No hyperdense vessel or unexpected calcification. Skull: Normal. Negative for fracture or focal lesion. Sinuses/Orbits: No acute finding. Other: None. IMPRESSION: No acute findings. Electronically Signed   By: Toribio Agreste M.D.   On:  05/29/2024 11:43   DG Tibia/Fibula Right Result Date: 05/29/2024 CLINICAL DATA:  Fall.  Right leg pain. EXAM: RIGHT TIBIA AND FIBULA - 2 VIEW COMPARISON:  None Available. FINDINGS: There is no evidence of fracture or other focal bone lesions. Moderate ankle osteoarthritis noted. Soft tissues are unremarkable. IMPRESSION: No acute findings. Moderate ankle osteoarthritis. Electronically Signed   By: Norleen DELENA Kil M.D.   On: 05/29/2024 11:32   DG FEMUR, MIN 2 VIEWS RIGHT Result Date: 05/29/2024 CLINICAL DATA:  Fall.  Distal femur pain and deformity. EXAM: RIGHT FEMUR 2 VIEWS COMPARISON:  None Available. FINDINGS: Comminuted displaced fracture of the distal femoral metaphysis is seen just above the femoral component of the knee arthroplasty. Moderate lateral and posterior displacement and angulation of the distal fracture fragment is seen. IMPRESSION: Comminuted displaced fracture of the distal femoral metaphysis, just above the femoral component of the knee arthroplasty. Electronically Signed   By: Norleen DELENA Kil M.D.   On: 05/29/2024 11:31   DG Pelvis 1-2 Views Result Date: 05/29/2024 CLINICAL DATA:  Fall.  Pelvic pain. EXAM: PELVIS - 1 VIEW COMPARISON:  None Available. FINDINGS: There is no evidence of pelvic fracture or diastasis. Bilateral sacroiliac degenerative changes are noted. Moderate bilateral hip osteoarthritis is seen. Advanced lower lumbar spine degenerative changes also noted. IMPRESSION: No evidence of pelvic fracture or other acute findings. Electronically Signed   By: Norleen DELENA Kil M.D.   On: 05/29/2024 11:29    ASSESSMENT/PLAN:  Assessment: Principal Problem:   Femur fracture (HCC) Active Problems:   COPD with chronic bronchitis and emphysema (HCC)   Osteoarthritis   Hypertension   DM (diabetes mellitus) type 2, uncontrolled, with ketoacidosis (HCC)   Current long-term use of anticoagulant medication with history of deep venous thrombosis (DVT)   Normocytic anemia   Fall  [name] is a 69 y.o. female with pertinent PMH of hypertension, COPD, type 2 diabetes, prior DVT/PE on anticoagulation, breast cancer s/p radiation and anti-estrogen therapy who presented with right leg pain after a fall and is admitted for comminuted and displaced right femur fracture.   Plan: Right femur fracture, comminuted displaced s/p ORIF postop day 0 Mechanical fall Patient says her pain has been very well controlled since she has been her, though has additional prn Dilaudid  for postop pain. Patient is now postoperative day 0, will ask PT & OT to evaluate patient. Given this patient's history of minimal trauma to cause fracture and reported history of  osteoporosis and anti-estrogen therapy will obtain a vit d level and may consider updated dexa or bisphosphonates. - vit d level  - Hydrocodone /acetaminophen  5/325 mg every 4 hours as needed - hydromorphone  0.5mg  IV every 3 hours as needed - Tylenol  650 mg every 8 hours, Voltaren  gel, duloxetine  60 mg twice daily - PT/OT to come evaluate this patient tomorrow - weightbearing as tolerated to the right lower extremity per ortho - postop cefazolin     Hypertension Appears to be on amlodipine , hydralazine, losartan , and Lasix as needed.  She does have a good fill history for amlodipine  and Lasix.  Continues to be mildly hypertensive here to 170s/80s, though will hold off on any additional medication adjustments until after her surgery. - continue amlodipine  10 mg daily and losartan  50 mg daily, may increase to home dose of 100 mg daily if blood pressure still high tomorrow - If needed add back hydralazine but this may be better as a 3 times daily medication instead of once daily as reported  Hx of unprovoked DVT - resume xarelto  20 mg daily at 1200 tomorrow  Normocytic anemia Hemoglobin 11.4 on admission which is stable from August 2025, and recheck today of 11.3. No signs of active bleeding.  As above holding anticoagulation. - Iron panel  WNL - CBC tomorrow  Best Practice: Diet: carb modified IVF: Fluids: LR, Rate: None VTE: enoxaparin  (LOVENOX ) injection 40 mg Start: 05/29/24 1245 Code: Full  Disposition planning: Therapy Recs: Pending, DME: pending Family Contact: grandson and son, at bedside. DISPO: Anticipated discharge in 1-3 days pending PT/OT recs  Signature: Alfornia Light, DO [facility]  7:40 AM, 05/30/2024  On Call pager [phone]  "

## 2024-05-30 NOTE — Op Note (Signed)
 Orthopaedic Surgery Operative Note (CSN: [id number] ) Date of Surgery: 05/30/2024  Admit Date: 05/29/2024   Diagnoses: Pre-Op Diagnoses: Right periprosthetic supracondylar distal femur fracture  Post-Op Diagnosis: Same  Procedures: CPT 27511-Open reduction internal fixation of right supracondylar distal femur fracture  Surgeons : Primary: Kendal Franky SQUIBB, MD  Assistant: Lauraine Moores, PA-C  Location: OR 4   Anesthesia: General   Antibiotics: Ancef  2g preop with 1 gm vancomycin  powder placed topically   Tourniquet time: None    Estimated Blood Loss: 200 mL  Complications:* No complications entered in OR log *   Specimens:* No specimens in log *   Implants: Implant Name Type Inv. Item Serial No. Manufacturer Lot No. LRB No. Used Action  PLATE FEM DIST NCB PP - [id number] Plate PLATE FEM DIST NCB PP  [employer](ORTH,TRAU,BIO,SG)  Right 1 Implanted  SCREW 5.0 - [id number] Screw SCREW 5.0  [employer](ORTH,TRAU,BIO,SG)  Right 3 Implanted  SCREW CORTICAL NCB 5.0X40 - [id number] Screw SCREW CORTICAL NCB 5.0X40  [employer](ORTH,TRAU,BIO,SG)  Right 1 Implanted  SCREW NCB 5.0X36MM - [id number] Screw SCREW NCB 5.0X36MM  [employer](ORTH,TRAU,BIO,SG)  Right 1 Implanted  CAP LOCK NCB - [id number] Cap CAP LOCK NCB  [employer](ORTH,TRAU,BIO,SG)  Right 5 Implanted  SCREW NCB 4.0X36MM - [id number] Screw SCREW NCB 4.0X36MM  [employer](ORTH,TRAU,BIO,SG)  Right 1 Implanted  SCREW NCB 5.0X85MM - [id number] Screw SCREW NCB 5.0X85MM  [employer](ORTH,TRAU,BIO,SG)  Right 2 Implanted     Indications for Surgery: 69 year old female who sustained a twisting injury to her right lower extremity and sustained a supracondylar distal femur fracture.  Due to the unstable nature of her injury I recommend proceeding with open reduction internal fixation.  Risks and benefits were discussed with the patient and her family.  Risks included but not limited to bleeding, infection,  malunion, nonunion, hardware failure, hardware rotation, nerve and blood vessel injury, DVT, even the possibility anesthetic complications they agreed to proceed with surgery and consent was obtained.  Operative Findings: 1.  Open reduction internal fixation of right supracondylar periprosthetic distal femur fracture using [employer] NCB distal femoral locking plate.  Procedure: The patient was identified in the preoperative holding area. Consent was confirmed with the patient and their family and all questions were answered. The operative extremity was marked after confirmation with the patient. she was then brought back to the operating room by our anesthesia colleagues.  She was placed under general anesthetic and carefully transferred over to a radiolucent flattop table.  A bump was placed under her operative hip.  The right lower extremity was then prepped and draped in usual sterile fashion.  Timeout was performed to verify the patient, the procedure, and the extremity.  Preoperative antibiotics were dosed.  The hip and knee were flexed over a triangle fluoroscopic imaging showed the unstable nature of her injury.  The lateral approach to the distal femur was made and carried down through skin and subcutaneous tissue.  I incised through the IT band and expose the lateral condyle of the femur.  The fracture was aligned appropriately and held provisionally with a 3.0 mm K wire.  I then chose a 12 hole [employer] NCB distal femoral locking plate and attach it to a targeting arm and slid submuscularly along the lateral cortex of the femur.  I held provisionally distally with a 2.0 mm guidewire and held the proximal portion in line with a percutaneous 3.3 mm drill bit.  I then drilled distally and  placed 5.0 millimeter screws to bring the plate flush to bone.  I then drilled a 5.0 millimeter screws into the femoral shaft to correct the coronal alignment of the fracture.  Total of 3 screws were placed  in the femoral shaft with the most proximal being a 4.0 millimeter screw.  Locking caps were placed on the 5.0 millimeter screws.  I returned to the distal segment and placed a total of five 5.0 millimeter screws.  Locking caps were placed in the hole of the distal screws.  Final fluoroscopic imaging was obtained.  The incisions were irrigated.  A gram of vancomycin  powder was placed into the incision.  A layer closure of 0 Vicryl, 2-0 Monocryl and 3-0 Monocryl and Dermabond was used to close the skin.  Sterile dressings were applied.  The patient was then awoke from anesthesia and taken to the PACU in stable condition.  Post Op Plan/Instructions: Patient will be weightbearing as tolerated to the right lower extremity.  She will receive postoperative Ancef .  She will receive her Xarelto  for DVT prophylaxis that may be able to start tomorrow morning.  Will have her mobilize with physical and Occupational Therapy.  I was present and performed the entire surgery.  Lauraine Moores, PA-C did assist me throughout the case. An assistant was necessary given the difficulty in approach, maintenance of reduction and ability to instrument the fracture.   Franky Light, MD [facility]

## 2024-05-30 NOTE — Anesthesia Preprocedure Evaluation (Addendum)
"                                    Anesthesia Evaluation  Patient identified by MRN, date of birth, ID band Patient awake    Reviewed: Allergy & Precautions, NPO status , Patient's Chart, lab work & pertinent test results  History of Anesthesia Complications Negative for: history of anesthetic complications  Airway Mallampati: I  TM Distance: >3 FB Neck ROM: Full    Dental  (+) Dental Advisory Given, Edentulous Upper, Edentulous Lower   Pulmonary asthma , sleep apnea , COPD, Current Smoker   breath sounds clear to auscultation       Cardiovascular hypertension, + CAD (via CT scan) and + DVT (on Xarelto )   Rhythm:Regular Rate:Normal     Neuro/Psych  PSYCHIATRIC DISORDERS         GI/Hepatic Neg liver ROS,GERD  Medicated and Controlled,,  Endo/Other  diabetes, Type 2   Last dose of GLP-1 < 1 week ago.  Renal/GU Renal InsufficiencyRenal disease     Musculoskeletal  (+) Arthritis ,    Abdominal   Peds  Hematology  (+) Blood dyscrasia, anemia  Hgb 11.3, Plts 184K (05/30/24)   Anesthesia Other Findings   Reproductive/Obstetrics  Hx of Breast Cancer                                Anesthesia Physical Anesthesia Plan  ASA: 3  Anesthesia Plan: General   Post-op Pain Management:    Induction: Intravenous and Rapid sequence  PONV Risk Score and Plan: 3 and Ondansetron , Dexamethasone , Treatment may vary due to age or medical condition and TIVA  Airway Management Planned: Oral ETT  Additional Equipment: None  Intra-op Plan:   Post-operative Plan: Extubation in OR  Informed Consent: I have reviewed the patients History and Physical, chart, labs and discussed the procedure including the risks, benefits and alternatives for the proposed anesthesia with the patient or authorized representative who has indicated his/her understanding and acceptance.     Dental advisory given  Plan Discussed with: CRNA  Anesthesia Plan  Comments:          Anesthesia Quick Evaluation  "

## 2024-05-30 NOTE — Plan of Care (Signed)
  Problem: Education: Goal: Ability to describe self-care measures that may prevent or decrease complications (Diabetes Survival Skills Education) will improve Outcome: Progressing   Problem: Coping: Goal: Ability to adjust to condition or change in health will improve Outcome: Progressing   Problem: Education: Goal: Knowledge of General Education information will improve Description: Including pain rating scale, medication(s)/side effects and non-pharmacologic comfort measures Outcome: Progressing   

## 2024-05-30 NOTE — Transfer of Care (Signed)
 Immediate Anesthesia Transfer of Care Note  Patient: [name]  Procedure(s) Performed: OPEN REDUCTION INTERNAL FIXATION DISTAL FEMUR FRACTURE (Right: Leg Upper)  Patient Location: PACU  Anesthesia Type:General  Level of Consciousness: awake and alert   Airway & Oxygen  Therapy: Patient Spontanous Breathing and Patient connected to face mask oxygen   Post-op Assessment: Report given to RN and Post -op Vital signs reviewed and stable  Post vital signs: Reviewed and stable  Last Vitals:  Vitals Value Taken Time  BP 147/71 05/30/24 15:24  Temp 37.2 C 05/30/24 15:24  Pulse 85 05/30/24 15:28  Resp 31 05/30/24 15:28  SpO2 96 % 05/30/24 15:28  Vitals shown include unfiled device data.  Last Pain:  Vitals:   05/30/24 1201  TempSrc: Oral  PainSc:       Patients Stated Pain Goal: 0 (05/30/24 1024)  Complications: No notable events documented.

## 2024-05-30 NOTE — Anesthesia Postprocedure Evaluation (Signed)
"   Anesthesia Post Note  Patient: BESAN KETCHEM  Procedure(s) Performed: OPEN REDUCTION INTERNAL FIXATION DISTAL FEMUR FRACTURE (Right: Leg Upper)     Patient location during evaluation: PACU Anesthesia Type: General Level of consciousness: awake Pain management: pain level controlled Vital Signs Assessment: post-procedure vital signs reviewed and stable Respiratory status: spontaneous breathing Cardiovascular status: blood pressure returned to baseline Postop Assessment: no apparent nausea or vomiting Anesthetic complications: no   No notable events documented.  Last Vitals:  Vitals:   05/30/24 1615 05/30/24 1651  BP: (!) 147/87 (!) 158/76  Pulse: 86   Resp: 16   Temp: 37 C 36.9 C  SpO2: 94% 96%    Last Pain:  Vitals:   05/30/24 1651  TempSrc: Oral  PainSc:                  Lauraine ONEIDA Colhoun      "

## 2024-05-30 NOTE — Anesthesia Procedure Notes (Signed)
 Procedure Name: Intubation Date/Time: 05/30/2024 1:50 PM  Performed by: Boyce Shilling, CRNAPre-anesthesia Checklist: Patient identified, Emergency Drugs available, Suction available, Timeout performed and Patient being monitored Patient Re-evaluated:Patient Re-evaluated prior to induction Oxygen  Delivery Method: Circle system utilized Preoxygenation: Pre-oxygenation with 100% oxygen  Induction Type: IV induction Ventilation: Mask ventilation without difficulty Laryngoscope Size: Mac and 3 Grade View: Grade I Tube type: Oral Tube size: 7.0 mm Number of attempts: 1 Airway Equipment and Method: Stylet Placement Confirmation: ETT inserted through vocal cords under direct vision, positive ETCO2, CO2 detector and breath sounds checked- equal and bilateral Secured at: 22 cm Tube secured with: Tape Dental Injury: Teeth and Oropharynx as per pre-operative assessment

## 2024-05-30 NOTE — Progress Notes (Signed)
 Patient's sons listed on Emergency Contact List concerned about scheduled surgery for today since she was on Xarelto , pt. stopped taking since yesterday. RN explained and educated patient and sons that concern was communicated to Ortho and Hospitalist. RN informed patient and son that MD will be rounding shortly. Son raised his voice, and son on the phone upset.  MD made aware.

## 2024-05-31 ENCOUNTER — Inpatient Hospital Stay: Admitting: Hematology and Oncology

## 2024-05-31 ENCOUNTER — Other Ambulatory Visit (HOSPITAL_COMMUNITY): Payer: Self-pay

## 2024-05-31 DIAGNOSIS — J449 Chronic obstructive pulmonary disease, unspecified: Secondary | ICD-10-CM

## 2024-05-31 DIAGNOSIS — D649 Anemia, unspecified: Secondary | ICD-10-CM | POA: Diagnosis not present

## 2024-05-31 DIAGNOSIS — K8681 Exocrine pancreatic insufficiency: Secondary | ICD-10-CM

## 2024-05-31 DIAGNOSIS — E119 Type 2 diabetes mellitus without complications: Secondary | ICD-10-CM

## 2024-05-31 DIAGNOSIS — I10 Essential (primary) hypertension: Secondary | ICD-10-CM | POA: Diagnosis not present

## 2024-05-31 DIAGNOSIS — S72351A Displaced comminuted fracture of shaft of right femur, initial encounter for closed fracture: Secondary | ICD-10-CM | POA: Diagnosis not present

## 2024-05-31 LAB — CBC
HCT: 27.6 % — ABNORMAL LOW (ref 36.0–46.0)
Hemoglobin: 9.6 g/dL — ABNORMAL LOW (ref 12.0–15.0)
MCH: 31.4 pg (ref 26.0–34.0)
MCHC: 34.8 g/dL (ref 30.0–36.0)
MCV: 90.2 fL (ref 80.0–100.0)
Platelets: 155 K/uL (ref 150–400)
RBC: 3.06 MIL/uL — ABNORMAL LOW (ref 3.87–5.11)
RDW: 14.1 % (ref 11.5–15.5)
WBC: 5.5 K/uL (ref 4.0–10.5)
nRBC: 0 % (ref 0.0–0.2)

## 2024-05-31 LAB — BASIC METABOLIC PANEL WITH GFR
Anion gap: 8 (ref 5–15)
BUN: 10 mg/dL (ref 8–23)
CO2: 25 mmol/L (ref 22–32)
Calcium: 8.8 mg/dL — ABNORMAL LOW (ref 8.9–10.3)
Chloride: 102 mmol/L (ref 98–111)
Creatinine, Ser: 0.81 mg/dL (ref 0.44–1.00)
GFR, Estimated: >60 mL/min
Glucose, Bld: 146 mg/dL — ABNORMAL HIGH (ref 70–99)
Potassium: 4.2 mmol/L (ref 3.5–5.1)
Sodium: 135 mmol/L (ref 135–145)

## 2024-05-31 LAB — GLUCOSE, CAPILLARY
Glucose-Capillary: 127 mg/dL — ABNORMAL HIGH (ref 70–99)
Glucose-Capillary: 133 mg/dL — ABNORMAL HIGH (ref 70–99)
Glucose-Capillary: 136 mg/dL — ABNORMAL HIGH (ref 70–99)

## 2024-05-31 MED ORDER — ACETAMINOPHEN 325 MG PO TABS: 650.0000 mg | ORAL_TABLET | Freq: Three times a day (TID) | ORAL | Status: DC

## 2024-05-31 MED ORDER — HYDROMORPHONE HCL 2 MG PO TABS
1.0000 mg | ORAL_TABLET | Freq: Once | ORAL | Status: AC
  Administered 2024-05-31: 1 mg via ORAL
  Filled 2024-05-31: qty 1

## 2024-05-31 MED ORDER — ACETAMINOPHEN 325 MG PO TABS
650.0000 mg | ORAL_TABLET | Freq: Four times a day (QID) | ORAL | Status: DC
  Administered 2024-05-31 – 2024-06-04 (×9): 650 mg via ORAL
  Filled 2024-05-31 (×10): qty 2

## 2024-05-31 MED ORDER — ENSURE PLUS HIGH PROTEIN PO LIQD
237.0000 mL | Freq: Two times a day (BID) | ORAL | Status: DC
  Filled 2024-05-31 (×10): qty 237

## 2024-05-31 NOTE — Progress Notes (Signed)
 Orthopaedic Trauma Progress Note  SUBJECTIVE: Doing well this afternoon.  Pain controlled.  Worked with therapies earlier, this went well.  No chest pain. No SOB. No nausea/vomiting. No other complaints.  Patient son at bedside  OBJECTIVE:  Vitals:   05/31/24 0818 05/31/24 1210  BP: (!) 126/59 110/60  Pulse: 81 94  Resp:  20  Temp: 98.7 F (37.1 C) 98.9 F (37.2 C)  SpO2: 100% 100%    Opiates Today (MME): Today's  total administered Morphine  Milligram Equivalents: 20 Opiates Yesterday (MME): Yesterday's total administered Morphine  Milligram Equivalents: 60  General: Sitting up in bed, no acute distress Respiratory: No increased work of breathing.  Operative Extremity (right lower extremity): Dressing is clean, dry, intact.  Some tenderness with palpation just above the knee as expected.  Nontender to the calf, ankle, foot.  Ankle DF/PF intact.  Endorses sensation all aspects of the foot.  Neurovascularly intact  IMAGING: Stable post op imaging.   LABS:  Results for orders placed or performed during the hospital encounter of 05/29/24 (from the past 24 hours)  Glucose, capillary     Status: Abnormal   Collection Time: 05/30/24  3:27 PM  Result Value Ref Range   Glucose-Capillary 135 (H) 70 - 99 mg/dL   Comment 1 Notify RN   Glucose, capillary     Status: Abnormal   Collection Time: 05/30/24  4:33 PM  Result Value Ref Range   Glucose-Capillary 133 (H) 70 - 99 mg/dL  VITAMIN D  25 Hydroxy (Vit-D Deficiency, Fractures)     Status: None   Collection Time: 05/30/24  7:24 PM  Result Value Ref Range   Vit D, 25-Hydroxy 57.2 30 - 100 ng/mL  Glucose, capillary     Status: Abnormal   Collection Time: 05/30/24  8:46 PM  Result Value Ref Range   Glucose-Capillary 190 (H) 70 - 99 mg/dL  CBC     Status: Abnormal   Collection Time: 05/31/24  5:21 AM  Result Value Ref Range   WBC 5.5 4.0 - 10.5 K/uL   RBC 3.06 (L) 3.87 - 5.11 MIL/uL   Hemoglobin 9.6 (L) 12.0 - 15.0 g/dL   HCT 72.3 (L)  63.9 - 46.0 %   MCV 90.2 80.0 - 100.0 fL   MCH 31.4 26.0 - 34.0 pg   MCHC 34.8 30.0 - 36.0 g/dL   RDW 85.8 88.4 - 84.4 %   Platelets 155 150 - 400 K/uL   nRBC 0.0 0.0 - 0.2 %  Basic metabolic panel with GFR     Status: Abnormal   Collection Time: 05/31/24  5:21 AM  Result Value Ref Range   Sodium 135 135 - 145 mmol/L   Potassium 4.2 3.5 - 5.1 mmol/L   Chloride 102 98 - 111 mmol/L   CO2 25 22 - 32 mmol/L   Glucose, Bld 146 (H) 70 - 99 mg/dL   BUN 10 8 - 23 mg/dL   Creatinine, Ser 9.18 0.44 - 1.00 mg/dL   Calcium  8.8 (L) 8.9 - 10.3 mg/dL   GFR, Estimated >39 >39 mL/min   Anion gap 8 5 - 15  Glucose, capillary     Status: Abnormal   Collection Time: 05/31/24  7:22 AM  Result Value Ref Range   Glucose-Capillary 127 (H) 70 - 99 mg/dL  Glucose, capillary     Status: Abnormal   Collection Time: 05/31/24 11:27 AM  Result Value Ref Range   Glucose-Capillary 133 (H) 70 - 99 mg/dL    ASSESSMENT: Mary  JONIQUE [name] is a 69 y.o. female, 1 Day Post-Op s/p fall Procedures: OPEN REDUCTION INTERNAL FIXATION RIGHT DISTAL FEMUR FRACTURE  CV/Blood loss: Acute blood loss anemia, Hgb 9.6 this AM. Hemodynamically stable  PLAN: Weightbearing: WBAT RLE ROM: Unrestricted ROM Incisional and dressing care: Reinforce dressings as needed  Showering: Okay to be getting incisions wet starting 06/02/2024 Orthopedic device(s): None  Pain management: Continue current regimen VTE prophylaxis: Xarelto , SCDs ID:  Ancef  2gm post op Foley/Lines:  No foley, KVO IVFs Impediments to Fracture Healing: Vitamin D  level 57, no additional supplementation needed Dispo: PT/OT evaluation ongoing, HH versus SNF.  Ortho issues stable.  Okay for discharge from ortho standpoint once cleared by medicine team and therapies  D/C recommendations: - Norco, Robaxin , Tylenol  for pain control - Home dose Xarelto  for DVT prophylaxis - No additional need for Vit D supplementation  Follow - up plan: 2 weeks after d/c for wound  check and repeat x-rays   Contact information:  Franky Light MD, Lauraine Moores PA-C. After hours and holidays please check [email or web address] for group call information for [facility]   Lauraine PATRIC Moores, PA-C [phone] (office) [email or web address]

## 2024-05-31 NOTE — Progress Notes (Addendum)
 "  HD#2 SUBJECTIVE:  Patient Summary: [name] is a 69 y.o. with a pertinent PMH of hypertension, COPD, type 2 diabetes, prior DVT/PE on anticoagulation, pancreatic insufficiency, breast cancer s/p radiation and anti-estrogen therapy who presented with right leg pain after stepping out from her vehicle and is admitted for comminuted and displaced right femur fracture.  Overnight Events: none.  Interim History: Patient was seen on rounds resting in bed after she had finished breakfast, hoping to take a nap. Doing surprisingly well as she thought she would be in agony but is not. Pain level 5/10. Patient says her pain has been excellently controlled since her procedure and has no complaints at this time, appears more relaxed that previous day. Hasn't had a bowel movement since the procedure but notes she typically has diarrhea from her pancreatic insufficiency, and is not concerned about being stopped up. Patient is nervous to work with PT/OT but is understandable that this will provide her the most mobility recovery, and is eager to get back to taking care of her family. Has never been to SNF or inpatient rehab but does expect that she might need to go to rehab this time.  OBJECTIVE:  Vital Signs: Vitals:   05/31/24 0028 05/31/24 0419 05/31/24 0500 05/31/24 0818  BP: 100/63 118/63  (!) 126/59  Pulse: 77 85  81  Resp: 18 18    Temp: 97.7 F (36.5 C) 98.8 F (37.1 C)  98.7 F (37.1 C)  TempSrc: Axillary Oral  Oral  SpO2: 99% 97%  100%  Weight:   83.5 kg   Height:       Supplemental O2: Room Air SpO2: 100 %  Filed Weights   05/30/24 0500 05/30/24 1201 05/31/24 0500  Weight: 85.6 kg 83.9 kg 83.5 kg    Intake/Output Summary (Last 24 hours) at 05/31/2024 1022 Last data filed at 05/31/2024 0544 Gross per 24 hour  Intake 1354.14 ml  Output 2500 ml  Net -1145.86 ml   Net IO Since Admission: -1,965.86 mL [05/31/24 1022]  Physical Exam: Physical Exam Constitutional:       Appearance: She is not ill-appearing, toxic-appearing or diaphoretic.  Cardiovascular:     Rate and Rhythm: Normal rate and regular rhythm.     Heart sounds: Normal heart sounds.  Pulmonary:     Effort: Pulmonary effort is normal. No respiratory distress.     Breath sounds: Normal breath sounds.  Musculoskeletal:     Comments: Warm bilat LE, neurovascularly intact - wiggling toes and ankles bilaterally in bed, R LE knee thoroughly wrapped non-TTP apart from area over lateral R knee occasionally with movement  Skin:    General: Skin is warm.  Neurological:     Mental Status: She is alert.    Patient Lines/Drains/Airways Status     Active Line/Drains/Airways     Name Placement date Placement time Site Days   Peripheral IV 18 G 1.16 Right Hand --  --  Hand  --   External Urinary Catheter 05/29/24  1856  --  2   Wound 05/30/24 1509 Surgical Closed Surgical Incision Leg Right 05/30/24  1509  Leg  1           Pertinent labs and imaging:     Latest Ref Rng & Units 05/31/2024    5:21 AM 05/30/2024    4:32 AM 05/29/2024   10:15 AM  CBC  WBC 4.0 - 10.5 K/uL 5.5  5.4  3.5   Hemoglobin 12.0 - 15.0 g/dL  9.6  11.3  11.4   Hematocrit 36.0 - 46.0 % 27.6  32.9  34.8   Platelets 150 - 400 K/uL 155  184  199        Latest Ref Rng & Units 05/31/2024    5:21 AM 05/30/2024    4:32 AM 05/29/2024   10:15 AM  CMP  Glucose 70 - 99 mg/dL 853  883  897   BUN 8 - 23 mg/dL 10  11  15    Creatinine 0.44 - 1.00 mg/dL 9.18  9.30  9.14   Sodium 135 - 145 mmol/L 135  135  138   Potassium 3.5 - 5.1 mmol/L 4.2  3.9  4.1   Chloride 98 - 111 mmol/L 102  101  104   CO2 22 - 32 mmol/L 25  23  25    Calcium  8.9 - 10.3 mg/dL 8.8  9.2  9.0    DG FEMUR PORT, MIN 2 VIEWS RIGHT Result Date: 05/30/2024 CLINICAL DATA:  Right femoral fracture fixation. EXAM: RIGHT FEMUR PORTABLE 2 VIEW COMPARISON:  05/29/2024 FINDINGS: Evidence of patient's interval fixation of distal right femoral metaphyseal fracture. Lateral  fixation plate and screws bridges the fracture site as hardware is intact with normal anatomic alignment over the fracture site. Right knee arthroplasty intact and unchanged. There is diffuse decreased bone mineralization. Mild osteoarthritic change of the right hip. IMPRESSION: Interval fixation of distal right femoral metaphyseal fracture with anatomic alignment. Electronically Signed   By: Toribio Agreste M.D.   On: 05/30/2024 17:04   DG FEMUR, MIN 2 VIEWS RIGHT Result Date: 05/30/2024 CLINICAL DATA:  ORIF right femur. EXAM: RIGHT FEMUR 2 VIEWS COMPARISON:  05/29/2024 FINDINGS: Multiple intraoperative images of the right femur demonstrate fixation of patient's distal metaphyseal fracture with lateral fixation plate and screws intact. Near anatomic alignment over the fracture site. Partial visualization of patient's right knee arthroplasty unchanged. Recommend correlation with findings at the time of the procedure. IMPRESSION: Fixation of patient's distal right femoral metaphyseal fracture as described. Electronically Signed   By: Toribio Agreste M.D.   On: 05/30/2024 17:02   DG C-Arm 1-60 Min-No Report Result Date: 05/30/2024 Fluoroscopy was utilized by the requesting physician.  No radiographic interpretation.    ASSESSMENT/PLAN:  Assessment: Principal Problem:   Femur fracture (HCC) Active Problems:   COPD with chronic bronchitis and emphysema (HCC)   Osteoarthritis   Hypertension   DM (diabetes mellitus) type 2, uncontrolled, with ketoacidosis (HCC)   Current long-term use of anticoagulant medication with history of deep venous thrombosis (DVT)   Normocytic anemia   Fall  [name] is a 69 y.o. female with pertinent PMH of hypertension, COPD, type 2 diabetes, prior DVT/PE on anticoagulation, pancreatic insufficiency, breast cancer s/p radiation and anti-estrogen therapy who presented with right leg pain after stepping out from her vehicle and is admitted for comminuted and displaced  right femur fracture.   Plan: Atypical, atraumatic right femur fracture, comminuted displaced s/p ORIF postop day 1 Mechanical fall Patient says her pain has been very well controlled since her operation. Patient is now postoperative day 1, will ask PT & OT to evaluate patient. Given this patient's history of minimal trauma to cause fracture may consider updated dexa or bisphosphonates. - Hydrocodone /acetaminophen  5/325 mg every 4 hours as needed - hydromorphone  0.5mg  IV every 3 hours as needed - Tylenol  650 mg every 8 hours, Voltaren  gel, duloxetine  60 mg twice daily - PT/OT to come evaluate this patient and provide recommendations -  weightbearing as tolerated to the right lower extremity per ortho   Hypertension Appears to be on amlodipine , hydralazine, losartan , and Lasix as needed.  She does have a good fill history for amlodipine  and Lasix.  Currently normotensive in the 110s/60s, so will make no changes at this time. - continue amlodipine  10 mg daily and losartan  50 mg daily  Hx of unprovoked DVT - resume xarelto  20 mg daily at 1200   Normocytic anemia Hemoglobin 11.4 on admission which is stable from August 2025, and recheck today of 9.6 from 11.3, likely secondary to her procedure yesterday though will continue to monitor. Patient still able to resume anticoagulation as scheduled since her hgb is >9. - Iron panel WNL - CBC tomorrow  Stable Chronic Problems: pancreatic insufficiency - continue Creon  three times daily before meals COPD - continue Brovana  two times daily, ellipta daily, and albuterol  as needed T2DM - continue sensitive SSI  Best Practice: Diet: carb modified IVF: Fluids: LR, Rate: None VTE: SCDs Start: 05/30/24 1634 Code: Full  Disposition planning: Therapy Recs: Pending, DME: pending Family Contact: grandson and son, at bedside. DISPO: Anticipated discharge in 1-2 days pending PT/OT recs  Signature: Alfornia Light, DO [facility]  Residency  10:22 AM, 05/31/2024  On Call pager [phone]  "

## 2024-05-31 NOTE — Evaluation (Signed)
 Occupational Therapy Evaluation Patient Details Name: [name] MRN: [id number] DOB: 1956/01/28 Today's Date: 05/31/2024   History of Present Illness   Pt is a 69 y.o. F presenting to [facility] on 05/29/24 following fall. X-ray shows comminuted displaced fx of distal femoral metaphysis on the R. Pt s/p ORIF on 1/22/6. PMH is significant for COPD, T2DM, breast CA, and HTN.     Clinical Impressions Pt c/o RLE pain, increases with activity to 9/10. Pt lives with partner who works during the day, has two sons who can help 24/7 if needed. Pt has 3+1 STE, one level home. PLOF Pt independent. Pt currently requires min A for LB ADLs, CGA for transfers very short distances, 10 feet with increased time due to pain. Pt ambulated to toilet but needed to wheel recliner over to toilet as she was in too much pain to ambulate back to bed. Pt would benefit from postacute rehab <3hrs/day to improve functional strength until able to complete steps and mobility consistent household distances, hopeful for quick recover, may flip to home if improves over the next few days. Will continue to see acutely to progress as able. If going home Pt will need RW and BSC for return home with HHOT.      If plan is discharge home, recommend the following:   A little help with walking and/or transfers;A little help with bathing/dressing/bathroom;Assistance with cooking/housework;Assist for transportation;Help with stairs or ramp for entrance     Functional Status Assessment   Patient has had a recent decline in their functional status and demonstrates the ability to make significant improvements in function in a reasonable and predictable amount of time.     Equipment Recommendations   Other (comment) (defer to SNF, if going home RW, [facility])     Recommendations for Other Services         Precautions/Restrictions   Precautions Precautions: Fall Recall of Precautions/Restrictions: Intact Restrictions Weight  Bearing Restrictions Per Provider Order: Yes RLE Weight Bearing Per Provider Order: Weight bearing as tolerated     Mobility Bed Mobility Overal bed mobility: Needs Assistance Bed Mobility: Supine to Sit, Sit to Supine     Supine to sit: Supervision Sit to supine: Supervision   General bed mobility comments: supervision for safety, Pt uses LLE to support RLE.    Transfers Overall transfer level: Needs assistance Equipment used: Rolling walker (2 wheels) Transfers: Sit to/from Stand, Bed to chair/wheelchair/BSC Sit to Stand: Contact guard assist     Step pivot transfers: Contact guard assist     General transfer comment: CGA for safety      Balance                                           ADL either performed or assessed with clinical judgement   ADL Overall ADL's : Needs assistance/impaired Eating/Feeding: Independent   Grooming: Set up;Sitting   Upper Body Bathing: Set up;Sitting   Lower Body Bathing: Minimal assistance;Sitting/lateral leans;Sit to/from stand   Upper Body Dressing : Set up;Sitting   Lower Body Dressing: Minimal assistance;Sitting/lateral leans;Sit to/from stand   Toilet Transfer: Contact guard assist;Rolling walker (2 wheels)   Toileting- Clothing Manipulation and Hygiene: Set up;Supervision/safety       Functional mobility during ADLs: Contact guard assist;Rolling walker (2 wheels) General ADL Comments: Pt doing well, CGA for mobility with RW for short distances. Pt min  A for LB ADLs, able to don/doff LLE socks, will need help with RLE. Pt ambulated to toilet CGA, managed hygiene without physical assist.     Vision Baseline Vision/History: 1 Wears glasses Ability to See in Adequate Light: 0 Adequate Patient Visual Report: No change from baseline       Perception         Praxis         Pertinent Vitals/Pain Pain Assessment Pain Assessment: 0-10 Pain Score: 9  Pain Location: R knee with activity Pain  Descriptors / Indicators: Aching, Grimacing, Discomfort Pain Intervention(s): Monitored during session     Extremity/Trunk Assessment Upper Extremity Assessment Upper Extremity Assessment: Overall WFL for tasks assessed   Lower Extremity Assessment Lower Extremity Assessment: Defer to PT evaluation       Communication Communication Communication: No apparent difficulties   Cognition Arousal: Alert Behavior During Therapy: WFL for tasks assessed/performed Cognition: No apparent impairments                               Following commands: Intact       Cueing  General Comments          Exercises     Shoulder Instructions      Home Living Family/patient expects to be discharged to:: Private residence Living Arrangements: Spouse/significant other Available Help at Discharge: Family;Available 24 hours/day Type of Home: House Home Access: Stairs to enter [employer] of Steps: 3+1 Entrance Stairs-Rails: None Home Layout: One level     Bathroom Shower/Tub: Chief Strategy Officer: Handicapped height     Home Equipment: Cane - single point   Additional Comments: Pt lives with partner who works, has two sons who could be there 24/7 if needed.      Prior Functioning/Environment Prior Level of Function : Independent/Modified Independent                    OT Problem List: Decreased strength;Decreased activity tolerance;Impaired balance (sitting and/or standing);Decreased range of motion;Pain   OT Treatment/Interventions: Self-care/ADL training;Therapeutic exercise;DME and/or AE instruction;Energy conservation;Therapeutic activities;Patient/family education;Balance training      OT Goals(Current goals can be found in the care plan section)   Acute Rehab OT Goals Patient Stated Goal: to manage pain OT Goal Formulation: With patient/family Time For Goal Achievement: 06/14/24 Potential to Achieve Goals: Good   OT  Frequency:  Min 2X/week    Co-evaluation              AM-PAC OT 6 Clicks Daily Activity     Outcome Measure Help from another person eating meals?: None Help from another person taking care of personal grooming?: A Little Help from another person toileting, which includes using toliet, bedpan, or urinal?: None Help from another person bathing (including washing, rinsing, drying)?: A Little Help from another person to put on and taking off regular upper body clothing?: None Help from another person to put on and taking off regular lower body clothing?: A Little 6 Click Score: 21   End of Session Equipment Utilized During Treatment: Gait belt;Rolling walker (2 wheels) Nurse Communication: Mobility status  Activity Tolerance: Patient tolerated treatment well Patient left: in bed;with call bell/phone within reach;with bed alarm set;with family/visitor present  OT Visit Diagnosis: Unsteadiness on feet (R26.81);Other abnormalities of gait and mobility (R26.89);Muscle weakness (generalized) (M62.81);Pain Pain - Right/Left: Right Pain - part of body: Leg  Time: 1442-1510 OT Time Calculation (min): 28 min Charges:  OT General Charges $OT Visit: 1 Visit OT Evaluation $OT Eval Low Complexity: 1 Low OT Treatments $Self Care/Home Management : 8-22 mins  [address] OTR/L   Elouise JONELLE Bott 05/31/2024, 3:22 PM

## 2024-05-31 NOTE — Evaluation (Signed)
 Physical Therapy Evaluation Patient Details Name: [name] MRN: [id number] DOB: 1955-09-22 Today's Date: 05/31/2024  History of Present Illness  Pt is a 69 y.o. F presenting to [facility] on 05/29/24 following fall. X-ray shows comminuted displaced fx of distal femoral metaphysis on the R. Pt s/p ORIF on 1/22/6. PMH is significant for COPD, T2DM, breast CA, and HTN.  Clinical Impression  Pt is currently presenting at supervision to Min A for bed mobility, CGA for sit to stand and 12 ft of gait with heavy UE support on RW. Pt has good family support at home. Pt may progress to home with family assist and would benefit from [facility] and RW. Due to pt current functional status, home set up and available assistance at home recommending skilled physical therapy services < 3 hours/day in order to address strength, balance and functional mobility to decrease risk for falls, injury, immobility, skin break down and re-hospitalization.          If plan is discharge home, recommend the following: A little help with walking and/or transfers;Help with stairs or ramp for entrance;Assistance with cooking/housework;Assist for transportation   Can travel by private vehicle   Yes    Equipment Recommendations BSC/3in1;Rolling walker (2 wheels)     Functional Status Assessment Patient has had a recent decline in their functional status and demonstrates the ability to make significant improvements in function in a reasonable and predictable amount of time.     Precautions / Restrictions Precautions Precautions: Fall Recall of Precautions/Restrictions: Intact Restrictions Weight Bearing Restrictions Per Provider Order: Yes RLE Weight Bearing Per Provider Order: Weight bearing as tolerated      Mobility  Bed Mobility Overal bed mobility: Needs Assistance Bed Mobility: Supine to Sit, Sit to Supine     Supine to sit: Supervision Sit to supine: Min assist   General bed mobility comments: Min A with RLE  to EOB    Transfers Overall transfer level: Needs assistance Equipment used: Rolling walker (2 wheels) Transfers: Sit to/from Stand Sit to Stand: Contact guard assist           General transfer comment: CGA for safety, verbal cues for safe hand placement.    Ambulation/Gait Ambulation/Gait assistance: Contact guard assist Gait Distance (Feet): 12 Feet Assistive device: Rolling walker (2 wheels) Gait Pattern/deviations: Step-to pattern, Decreased stance time - right, Decreased step length - right, Antalgic Gait velocity: decreased Gait velocity interpretation: <1.31 ft/sec, indicative of household ambulator   General Gait Details: short step length with low floor clearance on the R with minimal knee ROM.    Balance Overall balance assessment: Needs assistance Sitting-balance support: Single extremity supported, Feet supported Sitting balance-Leahy Scale: Good     Standing balance support: Bilateral upper extremity supported, Reliant on assistive device for balance, During functional activity Standing balance-Leahy Scale: Poor Standing balance comment: heavy reliance on UE during standing/gait         Pertinent Vitals/Pain Pain Assessment Pain Assessment: 0-10 Pain Score: 9  Pain Location: R knee Pain Descriptors / Indicators: Aching, Grimacing, Discomfort Pain Intervention(s): Monitored during session, Limited activity within patient's tolerance, Premedicated before session    Home Living Family/patient expects to be discharged to:: Private residence Living Arrangements: Spouse/significant other Available Help at Discharge: Family;Available 24 hours/day Type of Home: House Home Access: Stairs to enter Entrance Stairs-Rails: None Entrance Stairs-Number of Steps: 3+1   Home Layout: One level Home Equipment: Cane - single point Additional Comments: Pt lives with partner who works, has two sons  who could be there 24/7 if needed.    Prior Function Prior Level of  Function : Independent/Modified Independent       Extremity/Trunk Assessment   Upper Extremity Assessment Upper Extremity Assessment: Defer to OT evaluation    Lower Extremity Assessment Lower Extremity Assessment: RLE deficits/detail;Overall WFL for tasks assessed RLE: Unable to fully assess due to pain    Cervical / Trunk Assessment Cervical / Trunk Assessment: Normal  Communication   Communication Communication: No apparent difficulties    Cognition Arousal: Alert Behavior During Therapy: WFL for tasks assessed/performed   PT - Cognitive impairments: No apparent impairments     Following commands: Intact       Cueing Cueing Techniques: Verbal cues     General Comments General comments (skin integrity, edema, etc.): ACE wrap on RLE, no drainage noted. Son present during session        Assessment/Plan    PT Assessment Patient needs continued PT services  PT Problem List Decreased activity tolerance;Decreased balance;Decreased mobility;Pain       PT Treatment Interventions DME instruction;Therapeutic exercise;Gait training;Balance training;Stair training;Functional mobility training;Therapeutic activities;Patient/family education    PT Goals (Current goals can be found in the Care Plan section)  Acute Rehab PT Goals Patient Stated Goal: decrease pain PT Goal Formulation: With patient Time For Goal Achievement: 06/14/24 Potential to Achieve Goals: Good    Frequency Min 2X/week        AM-PAC PT 6 Clicks Mobility  Outcome Measure Help needed turning from your back to your side while in a flat bed without using bedrails?: A Little Help needed moving from lying on your back to sitting on the side of a flat bed without using bedrails?: A Little Help needed moving to and from a bed to a chair (including a wheelchair)?: A Little Help needed standing up from a chair using your arms (e.g., wheelchair or bedside chair)?: A Little Help needed to walk in  hospital room?: A Lot Help needed climbing 3-5 steps with a railing? : Total 6 Click Score: 15    End of Session Equipment Utilized During Treatment: Gait belt Activity Tolerance: Patient tolerated treatment well Patient left: in bed;with call bell/phone within reach;with bed alarm set;with family/visitor present Nurse Communication: Mobility status PT Visit Diagnosis: Pain;Unsteadiness on feet (R26.81);Other abnormalities of gait and mobility (R26.89);Muscle weakness (generalized) (M62.81) Pain - Right/Left: Right Pain - part of body: Knee    Time: 8656-8591 PT Time Calculation (min) (ACUTE ONLY): 25 min   Charges:   PT Evaluation $PT Eval Low Complexity: 1 Low PT Treatments $Therapeutic Activity: 8-22 mins PT General Charges $$ ACUTE PT VISIT: 1 Visit         Dorothyann Maier, DPT, CLT  Acute Rehabilitation Services Office: [phone] (Secure chat preferred)   Dorothyann VEAR Maier 05/31/2024, 4:18 PM

## 2024-06-01 DIAGNOSIS — D649 Anemia, unspecified: Secondary | ICD-10-CM | POA: Diagnosis not present

## 2024-06-01 DIAGNOSIS — E119 Type 2 diabetes mellitus without complications: Secondary | ICD-10-CM | POA: Diagnosis not present

## 2024-06-01 DIAGNOSIS — K8689 Other specified diseases of pancreas: Secondary | ICD-10-CM

## 2024-06-01 DIAGNOSIS — J439 Emphysema, unspecified: Secondary | ICD-10-CM | POA: Diagnosis not present

## 2024-06-01 DIAGNOSIS — S72351A Displaced comminuted fracture of shaft of right femur, initial encounter for closed fracture: Secondary | ICD-10-CM | POA: Diagnosis not present

## 2024-06-01 DIAGNOSIS — W1830XA Fall on same level, unspecified, initial encounter: Secondary | ICD-10-CM

## 2024-06-01 DIAGNOSIS — J4489 Other specified chronic obstructive pulmonary disease: Secondary | ICD-10-CM

## 2024-06-01 DIAGNOSIS — I10 Essential (primary) hypertension: Secondary | ICD-10-CM | POA: Diagnosis not present

## 2024-06-01 DIAGNOSIS — Z9889 Other specified postprocedural states: Secondary | ICD-10-CM

## 2024-06-01 DIAGNOSIS — Z86718 Personal history of other venous thrombosis and embolism: Secondary | ICD-10-CM | POA: Diagnosis not present

## 2024-06-01 LAB — GLUCOSE, CAPILLARY
Glucose-Capillary: 114 mg/dL — ABNORMAL HIGH (ref 70–99)
Glucose-Capillary: 90 mg/dL (ref 70–99)

## 2024-06-01 LAB — CBC
HCT: 24.9 % — ABNORMAL LOW (ref 36.0–46.0)
Hemoglobin: 8.4 g/dL — ABNORMAL LOW (ref 12.0–15.0)
MCH: 30.8 pg (ref 26.0–34.0)
MCHC: 33.7 g/dL (ref 30.0–36.0)
MCV: 91.2 fL (ref 80.0–100.0)
Platelets: 155 10*3/uL (ref 150–400)
RBC: 2.73 MIL/uL — ABNORMAL LOW (ref 3.87–5.11)
RDW: 14.1 % (ref 11.5–15.5)
WBC: 4.6 10*3/uL (ref 4.0–10.5)
nRBC: 0 % (ref 0.0–0.2)

## 2024-06-01 NOTE — [place] FL2 (Signed)
 " [place]  MEDICAID FL2 LEVEL OF CARE FORM     IDENTIFICATION  Patient Name: [name] Birthdate: 1955-07-27 Sex: female Admission Date (Current Location): 05/29/2024  [facility] and [place] Number:  Producer, Television/film/video and Address:  [facility], [address] [place], [place] [address]      Provider Number: [id number]  Attending Physician Name and Address:  Francesco Elsie NOVAK, MD  Relative Name and Phone Number:  Haley, [place], Emergency Contact  [phone]    Current Level of Care: Hospital Recommended Level of Care: Skilled Nursing Facility Prior Approval Number:    Date Approved/Denied:   PASRR Number: [id number]  Discharge Plan: SNF    Current Diagnoses: Patient Active Problem List   Diagnosis Date Noted   Femur fracture (HCC) 05/29/2024   DM (diabetes mellitus) type 2, uncontrolled, with ketoacidosis (HCC) 05/29/2024   Current long-term use of anticoagulant medication with history of deep venous thrombosis (DVT) 05/29/2024   Normocytic anemia 05/29/2024   Fall 05/29/2024   Genetic testing 09/08/2022   Family history of ovarian cancer 08/31/2022   Family history of prostate cancer 08/31/2022   Family history of kidney cancer 08/31/2022   Ductal carcinoma in situ (DCIS) of left breast 08/29/2022   DVT (deep venous thrombosis) (HCC) 05/27/2021   Hypertension 05/27/2021   Coronary artery calcification seen on CT scan 04/13/2021   Gait abnormality 01/19/2021   Dizziness 01/19/2021   Smoker 12/06/2019   Healthcare maintenance 12/06/2019   OSA (obstructive sleep apnea) 10/24/2019   CHEST PAIN 05/23/2007   DIZZINESS 05/21/2007   HYPERLIPIDEMIA 04/13/2007   COPD with chronic bronchitis and emphysema (HCC) 04/13/2007   GERD 04/13/2007   DIABETES MELLITUS, TYPE II 12/25/2006   SUBSTANCE ABUSE 06/13/2006   Osteoarthritis 06/13/2006   BACK PAIN, LUMBAR 06/13/2006   LIVER FUNCTION TESTS, ABNORMAL 06/13/2006    Orientation  RESPIRATION BLADDER Height & Weight     Self, Time, Situation, Place  Normal  (No data input) Weight: 186 lb 1.1 oz (84.4 kg) Height:  5' 2.5 (158.8 cm)  BEHAVIORAL SYMPTOMS/MOOD NEUROLOGICAL BOWEL NUTRITION STATUS      Continent Diet (See dc summary)  AMBULATORY STATUS COMMUNICATION OF NEEDS Skin   Limited Assist Verbally Other (Comment) (No data input)                       Personal Care Assistance Level of Assistance  Bathing, Feeding, Dressing Bathing Assistance: Limited assistance Feeding assistance: Independent Dressing Assistance: Limited assistance     Functional Limitations Info  Sight, Hearing, Speech Sight Info: Adequate Hearing Info: Adequate Speech Info: Adequate    SPECIAL CARE FACTORS FREQUENCY  PT (By licensed PT), OT (By licensed OT)     PT Frequency: 5x/wk OT Frequency: 5x/wk            Contractures Contractures Info: Not present    Additional Factors Info  Code Status, Allergies Code Status Info: FULL Allergies Info: Codeine  Tramadol           Current Medications (06/01/2024):  This is the current hospital active medication list Current Facility-Administered Medications  Medication Dose Route Frequency Provider Last Rate Last Admin   acetaminophen  (TYLENOL ) tablet 650 mg  650 mg Oral Q6H Pouch Mliss Dragon, MD   650 mg at 06/01/24 0612   albuterol  (VENTOLIN  HFA) 108 (90 Base) MCG/ACT inhaler 1-2 puff  1-2 puff Inhalation Q6H PRN Danton Lauraine LABOR, PA-C  amLODipine  (NORVASC ) tablet 10 mg  10 mg Oral Daily Danton Lauraine LABOR, PA-C   10 mg at 06/01/24 1048   arformoterol  (BROVANA ) nebulizer solution 15 mcg  15 mcg Nebulization BID Danton Lauraine LABOR, PA-C   15 mcg at 06/01/24 1049   And   umeclidinium bromide  (INCRUSE ELLIPTA ) 62.5 MCG/ACT 1 puff  1 puff Inhalation Daily Danton Lauraine LABOR, PA-C   1 puff at 06/01/24 1049   Chlorhexidine  Gluconate Cloth 2 % PADS 6 each  6 each Topical Daily Danton Lauraine LABOR, PA-C   6 each at 06/01/24 1049    diclofenac  Sodium (VOLTAREN ) 1 % topical gel 4 g  4 g Topical QID PRN Danton Lauraine LABOR, PA-C       docusate sodium  (COLACE) capsule 100 mg  100 mg Oral BID Danton Lauraine LABOR, PA-C   100 mg at 06/01/24 1048   DULoxetine  (CYMBALTA ) DR capsule 60 mg  60 mg Oral BID Danton Lauraine LABOR, PA-C   60 mg at 06/01/24 1048   feeding supplement (ENSURE PLUS HIGH PROTEIN) liquid 237 mL  237 mL Oral BID BM Trudy Mliss Dragon, MD       HYDROcodone -acetaminophen  (NORCO/VICODIN) 5-325 MG per tablet 1-2 tablet  1-2 tablet Oral Q4H PRN Danton Lauraine LABOR, PA-C   2 tablet at 06/01/24 1100   HYDROmorphone  (DILAUDID ) injection 0.5 mg  0.5 mg Intravenous Q3H PRN Danton Lauraine LABOR, PA-C       insulin  aspart (novoLOG ) injection 0-9 Units  0-9 Units Subcutaneous TID WC Danton Lauraine LABOR, PA-C   1 Units at 05/31/24 1629   lipase/protease/amylase (CREON ) capsule 36,000-72,000 Units  36,000-72,000 Units Oral TID AC Danton Lauraine LABOR, PA-C   72,000 Units at 05/31/24 1628   losartan  (COZAAR ) tablet 50 mg  50 mg Oral Daily Danton Lauraine LABOR, PA-C   50 mg at 06/01/24 1048   methocarbamol  (ROBAXIN ) tablet 500 mg  500 mg Oral Q8H PRN Danton Lauraine LABOR, PA-C   500 mg at 06/01/24 9387   Or   methocarbamol  (ROBAXIN ) injection 500 mg  500 mg Intravenous Q8H PRN Danton Lauraine LABOR, PA-C       metoCLOPramide  (REGLAN ) tablet 5-10 mg  5-10 mg Oral Q8H PRN Danton Lauraine LABOR, PA-C       Or   metoCLOPramide  (REGLAN ) injection 5-10 mg  5-10 mg Intravenous Q8H PRN Danton Lauraine LABOR, PA-C       mupirocin  ointment (BACTROBAN ) 2 % 1 Application  1 Application Nasal BID Danton Lauraine LABOR, PA-C   1 Application at 06/01/24 1049   ondansetron  (ZOFRAN ) tablet 4 mg  4 mg Oral Q6H PRN Danton Lauraine LABOR, PA-C       Or   ondansetron  (ZOFRAN ) injection 4 mg  4 mg Intravenous Q6H PRN Danton Lauraine LABOR, PA-C       pantoprazole  (PROTONIX ) EC tablet 40 mg  40 mg Oral Daily Danton Lauraine A, PA-C   40 mg at 06/01/24 1049   polyethylene glycol (MIRALAX  / GLYCOLAX ) packet 17 g   17 g Oral Daily PRN Danton Lauraine LABOR, PA-C       rivaroxaban  (XARELTO ) tablet 20 mg  20 mg Oral Q1200 Driscoll, Julie Anne, MD   20 mg at 05/31/24 1213     Discharge Medications: Please see discharge summary for a list of discharge medications.  Relevant Imaging Results:  Relevant Lab Results:   Additional Information SSN: [id number]  Lendia Dais, LCSWA     "

## 2024-06-01 NOTE — Progress Notes (Signed)
 Physical Therapy Treatment Patient Details Name: [name] MRN: [id number] DOB: May 22, 1955 Today's Date: 06/01/2024   History of Present Illness Pt is a 69 y.o. F presenting to [facility] on 05/29/24 following fall. X-ray shows comminuted displaced fx of distal femoral metaphysis on the R. Pt s/p ORIF on 1/22/6. PMH is significant for COPD, T2DM, breast CA, and HTN.    PT Comments  Patient in a fair amount of pain today which limited progress.  She ambulated about the same as yesterday.  Due to her limited progress, I do believe patient would benefit from intensive inpatient post-acute therapy, <3 hours.  Pt will benefit from continued PT while in hospital.      If plan is discharge home, recommend the following: A little help with walking and/or transfers;Help with stairs or ramp for entrance;Assistance with cooking/housework;Assist for transportation   Can travel by private vehicle     Yes  Equipment Recommendations  BSC/3in1;Rolling walker (2 wheels)    Recommendations for Other Services       Precautions / Restrictions Precautions Precautions: Fall Recall of Precautions/Restrictions: Intact Restrictions RLE Weight Bearing Per Provider Order: Weight bearing as tolerated     Mobility  Bed Mobility Overal bed mobility: Needs Assistance Bed Mobility: Supine to Sit     Supine to sit: Min assist     General bed mobility comments: Min A with RLE to EOB    Transfers Overall transfer level: Needs assistance Equipment used: Rolling walker (2 wheels) Transfers: Sit to/from Stand Sit to Stand: Contact guard assist                Ambulation/Gait Ambulation/Gait assistance: Contact guard assist Gait Distance (Feet): 12 Feet Assistive device: Rolling walker (2 wheels) Gait Pattern/deviations: Step-to pattern, Decreased stance time - right, Decreased step length - right, Antalgic Gait velocity: decreased     General Gait Details: short step length with low floor  clearance on the R with minimal knee ROM.   Stairs             Wheelchair Mobility     Tilt Bed    Modified Rankin (Stroke Patients Only)       Balance Overall balance assessment: Needs assistance Sitting-balance support: Single extremity supported, Feet supported Sitting balance-Leahy Scale: Good     Standing balance support: Bilateral upper extremity supported, Reliant on assistive device for balance, During functional activity Standing balance-Leahy Scale: Poor                              Communication Communication Communication: No apparent difficulties  Cognition Arousal: Alert Behavior During Therapy: WFL for tasks assessed/performed   PT - Cognitive impairments: No apparent impairments                         Following commands: Intact      Cueing Cueing Techniques: Verbal cues  Exercises      General Comments        Pertinent Vitals/Pain Pain Assessment Pain Assessment: 0-10 Pain Score: 6  Pain Location: R leg Pain Descriptors / Indicators: Aching, Grimacing, Discomfort Pain Intervention(s): Limited activity within patient's tolerance, Monitored during session, Premedicated before session    Home Living                          Prior Function  PT Goals (current goals can now be found in the care plan section) Progress towards PT goals: Progressing toward goals    Frequency    Min 2X/week      PT Plan      Co-evaluation              AM-PAC PT 6 Clicks Mobility   Outcome Measure  Help needed turning from your back to your side while in a flat bed without using bedrails?: A Little Help needed moving from lying on your back to sitting on the side of a flat bed without using bedrails?: A Little Help needed moving to and from a bed to a chair (including a wheelchair)?: A Little Help needed standing up from a chair using your arms (e.g., wheelchair or bedside chair)?: A  Little Help needed to walk in hospital room?: A Lot Help needed climbing 3-5 steps with a railing? : A Lot 6 Click Score: 16    End of Session Equipment Utilized During Treatment: Gait belt Activity Tolerance: Patient limited by pain Patient left: in chair;with call bell/phone within reach;with chair alarm set Nurse Communication: Mobility status PT Visit Diagnosis: Pain;Unsteadiness on feet (R26.81);Other abnormalities of gait and mobility (R26.89);Muscle weakness (generalized) (M62.81) Pain - Right/Left: Right Pain - part of body: Knee     Time: 1200-1222 PT Time Calculation (min) (ACUTE ONLY): 22 min  Charges:    $Gait Training: 8-22 mins PT General Charges $$ ACUTE PT VISIT: 1 Visit                     06/01/2024 [place], PT Acute Rehabilitation Services Office:  [phone]    [name] HERO 06/01/2024, 12:32 PM

## 2024-06-01 NOTE — Progress Notes (Signed)
 "  HD#3 SUBJECTIVE:  Patient Summary: [name] is a 69 y.o. with a pertinent PMH of hypertension, COPD, type 2 diabetes, prior DVT/PE on anticoagulation, pancreatic insufficiency, breast cancer s/p radiation and anti-estrogen therapy who presented with right leg pain after stepping out from her vehicle and is admitted for comminuted and displaced right femur fracture.  Overnight Events: none.  Interim History: Patient was seen this morning resting comfortably on her right side.  Patient says that she is sore though the pain medication has been helping, and is not actively in acute pain.  Patient was also reminded that she has additional PRNs if she needs them.  Discussed with patient PT/OT recommendation for SNF upon discharge, versus home health.  Patient says that she is willing to pursue SNF, and has no other questions or concerns at this time.  OBJECTIVE:  Vital Signs: Vitals:   05/31/24 1210 05/31/24 1557 05/31/24 2117 06/01/24 0520  BP: 110/60 110/60 (!) 116/58 (!) 147/68  Pulse: 94 88 83 79  Resp: 20 19 19 19   Temp: 98.9 F (37.2 C) 99.3 F (37.4 C) 99.8 F (37.7 C) 98.7 F (37.1 C)  TempSrc: Oral Oral Oral Oral  SpO2: 100% 99% 100% 100%  Weight:      Height:       Supplemental O2: Room Air SpO2: 100 %  Filed Weights   05/30/24 0500 05/30/24 1201 05/31/24 0500  Weight: 85.6 kg 83.9 kg 83.5 kg    Intake/Output Summary (Last 24 hours) at 06/01/2024 0535 Last data filed at 05/31/2024 2200 Gross per 24 hour  Intake 1339.2 ml  Output 1300 ml  Net 39.2 ml   Net IO Since Admission: -1,426.66 mL [06/01/24 0535]  Physical Exam: Physical Exam Constitutional:      Appearance: She is not ill-appearing, toxic-appearing or diaphoretic.  Cardiovascular:     Rate and Rhythm: Normal rate and regular rhythm.     Heart sounds: Normal heart sounds.  Pulmonary:     Effort: Pulmonary effort is normal. No respiratory distress.     Breath sounds: Normal breath sounds.   Musculoskeletal:     Comments: Warm bilat LE, neurovascularly intact - R LE knee thoroughly ACE wrapped, non-TTP  Skin:    General: Skin is warm.  Neurological:     Mental Status: She is alert.    Patient Lines/Drains/Airways Status     Active Line/Drains/Airways     Name Placement date Placement time Site Days   Peripheral IV 18 G 1.16 Right Hand --  --  Hand  --   External Urinary Catheter 05/29/24  1856  --  3   Wound 05/30/24 1509 Surgical Closed Surgical Incision Leg Right 05/30/24  1509  Leg  2           Pertinent labs and imaging:     Latest Ref Rng & Units 05/31/2024    5:21 AM 05/30/2024    4:32 AM 05/29/2024   10:15 AM  CBC  WBC 4.0 - 10.5 K/uL 5.5  5.4  3.5   Hemoglobin 12.0 - 15.0 g/dL 9.6  88.6  88.5   Hematocrit 36.0 - 46.0 % 27.6  32.9  34.8   Platelets 150 - 400 K/uL 155  184  199        Latest Ref Rng & Units 05/31/2024    5:21 AM 05/30/2024    4:32 AM 05/29/2024   10:15 AM  CMP  Glucose 70 - 99 mg/dL 853  883  897  BUN 8 - 23 mg/dL 10  11  15    Creatinine 0.44 - 1.00 mg/dL 9.18  9.30  9.14   Sodium 135 - 145 mmol/L 135  135  138   Potassium 3.5 - 5.1 mmol/L 4.2  3.9  4.1   Chloride 98 - 111 mmol/L 102  101  104   CO2 22 - 32 mmol/L 25  23  25    Calcium  8.9 - 10.3 mg/dL 8.8  9.2  9.0    No results found.   ASSESSMENT/PLAN:  Assessment: Principal Problem:   Femur fracture (HCC) Active Problems:   COPD with chronic bronchitis and emphysema (HCC)   Osteoarthritis   Hypertension   DM (diabetes mellitus) type 2, uncontrolled, with ketoacidosis (HCC)   Current long-term use of anticoagulant medication with history of deep venous thrombosis (DVT)   Normocytic anemia   Fall  [name] is a 69 y.o. female with pertinent PMH of hypertension, COPD, type 2 diabetes, prior DVT/PE on anticoagulation, pancreatic insufficiency, breast cancer s/p radiation and anti-estrogen therapy who presented with right leg pain after stepping out from her  vehicle and is admitted for comminuted and displaced right femur fracture.   Plan: Atypical, atraumatic right femur fracture, comminuted displaced s/p ORIF postop day 2 Mechanical fall Patient says her pain has been very well controlled since her operation. Patient is now postoperative day 2, PT/OT has recommended SNF placement for this patient, though believe could be a potential candidate for home health if she continues to progress while she is here. Patient agreeable to SNF at this time, and LCSW is beginning workup - Hydrocodone /acetaminophen  5/325 mg every 4 hours as needed - hydromorphone  0.5mg  IV every 3 hours as needed - Tylenol  650 mg every 8 hours, Voltaren  gel, duloxetine  60 mg twice daily - PT/OT recommending SNF - weightbearing as tolerated to the right lower extremity per ortho   Hypertension Appears to be on amlodipine , hydralazine, losartan , and Lasix as needed.  She does have a good fill history for amlodipine  and Lasix.  Currently normotensive in the 130s/70s, so will make no changes at this time. - continue amlodipine  10 mg daily and losartan  50 mg daily  Hx of unprovoked DVT - Continue xarelto  20 mg daily   Normocytic anemia Hemoglobin 11.4 on admission which is stable from August 2025, and recheck today of 8.4 from 9.6, likely secondary to her procedure though will continue to monitor, though may require more thorough assessment if Hgb continues to decrease.  - CBC tomorrow  Stable Chronic Problems: pancreatic insufficiency - continue Creon  three times daily before meals COPD - continue Brovana  two times daily, ellipta daily, and albuterol  as needed T2DM - continue sensitive SSI  Best Practice: Diet: carb modified IVF: Fluids: LR, Rate: None VTE: SCDs Start: 05/30/24 1634 Code: Full  Disposition planning: Therapy Recs: Pending, DME: pending Family Contact: grandson and son, at bedside. DISPO: Anticipated discharge in 1-2 days pending SNF  placement  Signature: Alfornia Light, DO [facility]  5:35 AM, 06/01/2024  On Call pager [phone]  "

## 2024-06-01 NOTE — Progress Notes (Addendum)
" °   05/29/24 0002  CSW ASSESSMENT  Date of Clinical Social Work Referral 06/01/24  Reason for Consult Discharge Planning  Permission sought to share information with  Family Supports  Permission granted to share informaiton with  Yes, Verbal Permission Granted  Share Information with [name]  Permission granted to share info w Relationship son  Permission granted to share info w Contact Information [phone]  Living arrangements for the past 2 months Apartment  Source of Information  Patient  Patient Interpreter Needed  None  Criminal Activity/Legal Involvement Pertinent to Current Situation/Hospitalization No - Comment as needed  Significant Relationships: Adult Children;Significant Other  Lives with: Significant Other  Do you feel safe going back to the place where you live? Y  Need for Family Participation in Patient Care N  Employment Status Retired  Health And Safety Inspector Medicare;Medicaid In [place]  PT Recommendations Skilled Nursing Facility  Information / Referral to [facility]  Appearance Appears stated age  Attitude/Demeanor/Rapport Engaged  AFFECT (Typically Observed) Pleasant  Orientation Oriented to Self;Oriented to Situation;Oriented to Place;Oriented to  Time  Alcohol / Substance Use Not Applicable  Psych Involvement N  Concerns to Be Addressed Denies Needs/Concerns at this time  Readmission within the last 30 days N  Current Discharge Risk None  Barriers to Discharge Continued Medical Work up;English As A Second Language Teacher;No SNF bed   CSW spoke to the pt at bedside and introduced self and role. CSW informed the pt of PT recs of SNF and the pt was agreeable. Pt stated they would like referrals to be sent in the [place] area.   Referrals have been sent and pend offers are pending.  CSW will continue to follow.  [name], [facility] Inpatient Care Management (ICM) [phone] "

## 2024-06-01 NOTE — Progress Notes (Signed)
" ° ° ° °  Subjective:  Patient reports pain as moderate.  Patient resting in bed.  She reports pain but is tolerable for her.  Yesterday's total administered Morphine  [place] Equivalents: 34   Objective:   VITALS:   Vitals:   05/31/24 2117 06/01/24 0500 06/01/24 0520 06/01/24 0805  BP: (!) 116/58  (!) 147/68 131/71  Pulse: 83  79 73  Resp: 19  19 18   Temp: 99.8 F (37.7 C)  98.7 F (37.1 C) 98.4 F (36.9 C)  TempSrc: Oral  Oral Oral  SpO2: 100%  100% 100%  Weight:  84.4 kg    Height:        Well-appearing woman resting in bed.  No acute distress.  Clean dressings on the right thigh and femur.  Thigh soft and compressible.  No calf tenderness.  Intact sensation in the saphenous, sural, tibial, peroneal nerve distributions.  5/5 strength EHL, FHL, gastrocs, tibialis anterior  Lab Results  Component Value Date   WBC 4.6 06/01/2024   HGB 8.4 (L) 06/01/2024   HCT 24.9 (L) 06/01/2024   MCV 91.2 06/01/2024   PLT 155 06/01/2024   BMET    Component Value Date/Time   NA 135 05/31/2024 0521   NA 144 06/03/2021 0939   K 4.2 05/31/2024 0521   CL 102 05/31/2024 0521   CO2 25 05/31/2024 0521   GLUCOSE 146 (H) 05/31/2024 0521   BUN 10 05/31/2024 0521   BUN 12 06/03/2021 0939   CREATININE 0.81 05/31/2024 0521   CREATININE 0.97 08/31/2022 1000   CALCIUM  8.8 (L) 05/31/2024 0521   EGFR 79 06/03/2021 0939   GFRNONAA >60 05/31/2024 0521   GFRNONAA >60 08/31/2022 1000        Assessment/Plan: 2 Days Post-Op   Principal Problem:   Femur fracture (HCC) Active Problems:   COPD with chronic bronchitis and emphysema (HCC)   Osteoarthritis   Hypertension   DM (diabetes mellitus) type 2, uncontrolled, with ketoacidosis (HCC)   Current long-term use of anticoagulant medication with history of deep venous thrombosis (DVT)   Normocytic anemia   Fall  Patient is doing well after surgery.  She may continue weight-bear as tolerated in the right leg.  Will reinforce the dressing as  needed.  She will continue her home Xarelto  and SCD for DVT prophylaxis.  Continue current pain treatment.  Continue the current bowel regimen.  Disposition will based on how she progresses with therapy.  From orthopedic standpoint, she can discharged to follow-up with Dr. Kendal in 2 weeks time    Cordella SHAUNNA Rhein 06/01/2024, 11:04 AM   Cordella Rhein, MD, MS [facility] Orthopedics Specialist / [place] [phone]   Contact information:   Tzzxijbd 7am-5pm epic message Dr. Rhein, or call office for patient follow up: [phone] After hours and holidays please check [email or web address] for group call information for [facility]   "

## 2024-06-02 DIAGNOSIS — J4489 Other specified chronic obstructive pulmonary disease: Secondary | ICD-10-CM | POA: Diagnosis not present

## 2024-06-02 DIAGNOSIS — I10 Essential (primary) hypertension: Secondary | ICD-10-CM | POA: Diagnosis not present

## 2024-06-02 DIAGNOSIS — J439 Emphysema, unspecified: Secondary | ICD-10-CM | POA: Diagnosis not present

## 2024-06-02 DIAGNOSIS — S72351A Displaced comminuted fracture of shaft of right femur, initial encounter for closed fracture: Secondary | ICD-10-CM | POA: Diagnosis not present

## 2024-06-02 LAB — CBC
HCT: 24.1 % — ABNORMAL LOW (ref 36.0–46.0)
Hemoglobin: 8.2 g/dL — ABNORMAL LOW (ref 12.0–15.0)
MCH: 31.2 pg (ref 26.0–34.0)
MCHC: 34 g/dL (ref 30.0–36.0)
MCV: 91.6 fL (ref 80.0–100.0)
Platelets: 168 10*3/uL (ref 150–400)
RBC: 2.63 MIL/uL — ABNORMAL LOW (ref 3.87–5.11)
RDW: 14 % (ref 11.5–15.5)
WBC: 5.2 10*3/uL (ref 4.0–10.5)
nRBC: 0 % (ref 0.0–0.2)

## 2024-06-02 LAB — GLUCOSE, CAPILLARY
Glucose-Capillary: 125 mg/dL — ABNORMAL HIGH (ref 70–99)
Glucose-Capillary: 119 mg/dL — ABNORMAL HIGH (ref 70–99)
Glucose-Capillary: 130 mg/dL — ABNORMAL HIGH (ref 70–99)
Glucose-Capillary: 124 mg/dL — ABNORMAL HIGH (ref 70–99)

## 2024-06-02 MED ORDER — PANCRELIPASE (LIP-PROT-AMYL) 36000-114000 UNITS PO CPEP: 36000.0000 [IU] | ORAL_CAPSULE | Freq: Three times a day (TID) | ORAL | Status: DC | PRN

## 2024-06-02 MED ORDER — PANCRELIPASE (LIP-PROT-AMYL) 36000-114000 UNITS PO CPEP
72000.0000 [IU] | ORAL_CAPSULE | Freq: Three times a day (TID) | ORAL | Status: DC
  Administered 2024-06-03 – 2024-06-04 (×5): 72000 [IU] via ORAL
  Filled 2024-06-02 (×6): qty 2

## 2024-06-02 NOTE — Progress Notes (Signed)
 Orthopaedic Progress Note  S: The patient reports minimal pain.  She has been working with therapy.  O:  Vitals:   06/02/24 0344 06/02/24 0832  BP: (!) 139/54 (!) 117/45  Pulse: 81 78  Resp: 17 16  Temp: 98.8 F (37.1 C) 99 F (37.2 C)  SpO2: 100% 97%    Well-appearing woman resting in bed.  No acute distress.  Clean dressings on the right thigh and femur.  Thigh soft and compressible.  No calf tenderness.  Intact sensation in the saphenous, sural, tibial, peroneal nerve distributions.  5/5 strength EHL, FHL, gastrocs, tibialis anterior      Labs:  Results for orders placed or performed during the hospital encounter of 05/29/24 (from the past 24 hours)  Glucose, capillary     Status: None   Collection Time: 06/01/24 11:44 AM  Result Value Ref Range   Glucose-Capillary 90 70 - 99 mg/dL  CBC     Status: Abnormal   Collection Time: 06/02/24  4:22 AM  Result Value Ref Range   WBC 5.2 4.0 - 10.5 K/uL   RBC 2.63 (L) 3.87 - 5.11 MIL/uL   Hemoglobin 8.2 (L) 12.0 - 15.0 g/dL   HCT 75.8 (L) 63.9 - 53.9 %   MCV 91.6 80.0 - 100.0 fL   MCH 31.2 26.0 - 34.0 pg   MCHC 34.0 30.0 - 36.0 g/dL   RDW 85.9 88.4 - 84.4 %   Platelets 168 150 - 400 K/uL   nRBC 0.0 0.0 - 0.2 %  Glucose, capillary     Status: Abnormal   Collection Time: 06/02/24  7:55 AM  Result Value Ref Range   Glucose-Capillary 125 (H) 70 - 99 mg/dL    Assessment: Status post ORIF of right distal femur  Patient is doing well.  Pain is tolerable.  She will continue work with physical therapy.  She will continue Xarelto  for DVT prophylaxis.  Will see how she dresses with therapy but likely have her go to rehab once arrangements can be made.  She will follow-up Dr. Kendal in 2 weeks time  Injuries: Right distal femur fracture  Weightbearing: Weightbearing as tolerated  Insicional and dressing care: Can change as needed    CV/Blood loss: H/H is 8.2/24.1.  Pain management: Continue current pain regimen  VTE  prophylaxis: Xarelto    Dispo: Rehab  Follow - up plan: 2 weeksWith Dr. Kendal Cordella Rhein, MD, MS [facility] Orthopedics Specialist / [place] [phone]

## 2024-06-02 NOTE — Care Management Important Message (Signed)
 Important Message  Patient Details  Name: RAILEY GLAD MRN: [id number] Date of Birth: [birth year]   Important Message Given:  Yes - Medicare IM  Printed to unit and staff to give to patient   Charlann Rayfield Hurst, RN 06/02/2024, 10:41 AM

## 2024-06-02 NOTE — Progress Notes (Signed)
 "  HD#4 SUBJECTIVE:  Patient Summary: [name] is a 69 y.o. with a pertinent PMH of hypertension, COPD, type 2 diabetes, prior DVT/PE on anticoagulation, pancreatic insufficiency, breast cancer s/p radiation and anti-estrogen therapy who presented with right leg pain after stepping out from her vehicle and is admitted for comminuted and displaced right femur fracture.  Overnight Events: none.  Interim History: Seen at bedside, denies chest pain or SOB. Had a bowel movement yesterday.  No acute concerns.  OBJECTIVE:  Vital Signs: Vitals:   06/02/24 0344 06/02/24 0428 06/02/24 0832 06/02/24 1206  BP: (!) 139/54  (!) 117/45 126/64  Pulse: 81  78 78  Resp: 17  16 16   Temp: 98.8 F (37.1 C)  99 F (37.2 C) 99.1 F (37.3 C)  TempSrc:   Oral Oral  SpO2: 100%  97% 99%  Weight:  85 kg    Height:       Supplemental O2: Room Air SpO2: 99 %  Filed Weights   05/31/24 0500 06/01/24 0500 06/02/24 0428  Weight: 83.5 kg 84.4 kg 85 kg    Intake/Output Summary (Last 24 hours) at 06/02/2024 1222 Last data filed at 06/02/2024 0946 Gross per 24 hour  Intake 240 ml  Output 300 ml  Net -60 ml   Net IO Since Admission: -2,026.66 mL [06/02/24 1222]  Physical Exam: Physical Exam Constitutional:      Appearance: She is not ill-appearing, toxic-appearing or diaphoretic.  Cardiovascular:     Rate and Rhythm: Normal rate and regular rhythm.     Heart sounds: Normal heart sounds.  Pulmonary:     Effort: Pulmonary effort is normal. No respiratory distress.     Breath sounds: Normal breath sounds.  Musculoskeletal:     Comments: Warm bilat LE, neurovascularly intact - R LE knee thoroughly ACE wrapped, non-TTP  Skin:    General: Skin is warm.  Neurological:     Mental Status: She is alert.    Patient Lines/Drains/Airways Status     Active Line/Drains/Airways     Name Placement date Placement time Site Days   External Urinary Catheter 05/29/24  1856  --  4   Wound 05/30/24 1509  Surgical Closed Surgical Incision Leg Right 05/30/24  1509  Leg  3           Pertinent labs and imaging:     Latest Ref Rng & Units 06/02/2024    4:22 AM 06/01/2024    5:56 AM 05/31/2024    5:21 AM  CBC  WBC 4.0 - 10.5 K/uL 5.2  4.6  5.5   Hemoglobin 12.0 - 15.0 g/dL 8.2  8.4  9.6   Hematocrit 36.0 - 46.0 % 24.1  24.9  27.6   Platelets 150 - 400 K/uL 168  155  155        Latest Ref Rng & Units 05/31/2024    5:21 AM 05/30/2024    4:32 AM 05/29/2024   10:15 AM  CMP  Glucose 70 - 99 mg/dL 853  883  897   BUN 8 - 23 mg/dL 10  11  15    Creatinine 0.44 - 1.00 mg/dL 9.18  9.30  9.14   Sodium 135 - 145 mmol/L 135  135  138   Potassium 3.5 - 5.1 mmol/L 4.2  3.9  4.1   Chloride 98 - 111 mmol/L 102  101  104   CO2 22 - 32 mmol/L 25  23  25    Calcium  8.9 - 10.3 mg/dL  8.8  9.2  9.0    No results found.   ASSESSMENT/PLAN:  Assessment: Principal Problem:   Femur fracture (HCC) Active Problems:   COPD with chronic bronchitis and emphysema (HCC)   Osteoarthritis   Hypertension   DM (diabetes mellitus) type 2, uncontrolled, with ketoacidosis (HCC)   Current long-term use of anticoagulant medication with history of deep venous thrombosis (DVT)   Normocytic anemia   Fall  [name] is a 69 y.o. female with pertinent PMH of hypertension, COPD, type 2 diabetes, prior DVT/PE on anticoagulation, pancreatic insufficiency, breast cancer s/p radiation and anti-estrogen therapy who presented with right leg pain after stepping out from her vehicle and is admitted for comminuted and displaced right femur fracture.   Plan: Atypical, atraumatic right femur fracture, comminuted displaced s/p ORIF postop day 2 Mechanical fall POD #3, doing well. Endorses adequate pain control. PT/OT recommend SNF; patient agreeable. Social work following. Per patient, she has a list of SNF options and is in the process of selecting a facility.  - Hydrocodone /acetaminophen  5/325 mg every 4 hours as needed -  hydromorphone  0.5mg  IV every 3 hours as needed - Tylenol  650 mg every 8 hours, Voltaren  gel, duloxetine  60 mg twice daily - weightbearing as tolerated to the right lower extremity per ortho   Hypertension Normotensive, on amlodipine  10 mg daily and losartan  50 mg daily. -Continue treatment as above - Will get a BMP tomorrow.  Hx of unprovoked DVT - Continue xarelto  20 mg daily   Normocytic anemia Hb stable at 8.2, monitor from 8.4 yesterday. - CBC as above.  Stable Chronic Problems: pancreatic insufficiency - continue Creon  three times daily before meals COPD - continue Brovana  two times daily, ellipta daily, and albuterol  as needed T2DM - continue sensitive SSI  Best Practice: Diet: carb modified IVF: Fluids: LR, Rate: None VTE: SCDs Start: 05/30/24 1634 Code: Full  Disposition planning: Therapy Recs: Pending, DME: pending Family Contact: grandson and son, at bedside. DISPO: Anticipated discharge in 1-2 days pending SNF placement  Signature: Missy Sandhoff, DO [name]  12:22 PM, 06/02/2024  On Call pager [phone]  "

## 2024-06-02 NOTE — Progress Notes (Signed)
 There was a consult for placing a PIV access. This patient has only prn IV pain meds and she has po pain meds as well. Recommended patient's nurse that it is better give po meds rather than IV meds. She agreed with it. Canceled the IV consult. HS [employer]

## 2024-06-02 NOTE — Plan of Care (Signed)

## 2024-06-03 ENCOUNTER — Telehealth (HOSPITAL_COMMUNITY): Payer: Self-pay

## 2024-06-03 ENCOUNTER — Other Ambulatory Visit (HOSPITAL_COMMUNITY): Payer: Self-pay

## 2024-06-03 DIAGNOSIS — I10 Essential (primary) hypertension: Secondary | ICD-10-CM | POA: Diagnosis not present

## 2024-06-03 DIAGNOSIS — J449 Chronic obstructive pulmonary disease, unspecified: Secondary | ICD-10-CM | POA: Diagnosis not present

## 2024-06-03 DIAGNOSIS — S72351A Displaced comminuted fracture of shaft of right femur, initial encounter for closed fracture: Secondary | ICD-10-CM | POA: Diagnosis not present

## 2024-06-03 DIAGNOSIS — W19XXXA Unspecified fall, initial encounter: Secondary | ICD-10-CM | POA: Diagnosis not present

## 2024-06-03 DIAGNOSIS — K8689 Other specified diseases of pancreas: Secondary | ICD-10-CM | POA: Diagnosis not present

## 2024-06-03 DIAGNOSIS — D649 Anemia, unspecified: Secondary | ICD-10-CM | POA: Diagnosis not present

## 2024-06-03 DIAGNOSIS — E119 Type 2 diabetes mellitus without complications: Secondary | ICD-10-CM | POA: Diagnosis not present

## 2024-06-03 DIAGNOSIS — Z86718 Personal history of other venous thrombosis and embolism: Secondary | ICD-10-CM | POA: Diagnosis not present

## 2024-06-03 LAB — GLUCOSE, CAPILLARY
Glucose-Capillary: 108 mg/dL — ABNORMAL HIGH (ref 70–99)
Glucose-Capillary: 118 mg/dL — ABNORMAL HIGH (ref 70–99)
Glucose-Capillary: 124 mg/dL — ABNORMAL HIGH (ref 70–99)
Glucose-Capillary: 119 mg/dL — ABNORMAL HIGH (ref 70–99)

## 2024-06-03 NOTE — Discharge Summary (Signed)
 "  Name: [name] MRN: [id number] DOB: 10-Jan-1956 69 y.o. PCP: Mary Rosina SAILOR, PA  Date of Admission: 05/29/2024 10:01 AM Date of Discharge: 06/04/2024 Attending Physician: Dr. MICAEL Riis [name]  Discharge Diagnosis: 1. Principal Problem:   Femur fracture (HCC) Active Problems:   COPD with chronic bronchitis and emphysema (HCC)   Osteoarthritis   Hypertension   DM (diabetes mellitus) type 2, uncontrolled, with ketoacidosis (HCC)   Current long-term use of anticoagulant medication with history of deep venous thrombosis (DVT)   Normocytic anemia   Fall  Discharge Medications: Allergies as of 06/04/2024       Reactions   Codeine Itching   Tramadol Nausea And Vomiting, Other (See Comments)   Feels overly intoxicated        Medication List     STOP taking these medications    celecoxib  400 MG capsule Commonly known as: CELEBREX    cyclobenzaprine 10 MG tablet Commonly known as: FLEXERIL   enoxaparin  100 MG/ML injection Commonly known as: Lovenox    furosemide 40 MG tablet Commonly known as: LASIX   hydrALAZINE 25 MG tablet Commonly known as: APRESOLINE   nitroGLYCERIN  0.4 MG SL tablet Commonly known as: NITROSTAT    nortriptyline 50 MG capsule Commonly known as: PAMELOR   oxyCODONE  5 MG immediate release tablet Commonly known as: Oxy IR/ROXICODONE        TAKE these medications    acetaminophen  650 MG CR tablet Commonly known as: TYLENOL  Take 1,300 mg by mouth in the morning and at bedtime.   albuterol  108 (90 Base) MCG/ACT inhaler Commonly known as: VENTOLIN  HFA Inhale 1-2 puffs into the lungs every 6 (six) hours as needed for wheezing or shortness of breath.   amLODipine  10 MG tablet Commonly known as: NORVASC  Take 10 mg by mouth daily.   B-12 PO Take 1 tablet by mouth daily.   CALCIUM  PO Take 1 tablet by mouth daily.   CENTRUM ADULTS PO Take 1 tablet by mouth daily.   chlorhexidine  4 % external liquid Commonly known as:  HIBICLENS  Apply 15 mLs (1 Application total) topically as directed for 30 doses. Use as directed daily for 5 days every other week for 6 weeks.   cholecalciferol 25 MCG (1000 UNIT) tablet Commonly known as: VITAMIN D3 Take 2,000 Units by mouth 3 (three) times a week.   Creon  36000 UNITS Cpep capsule Generic drug: lipase/protease/amylase Take 1-2 capsules by mouth 3 (three) times daily before meals. 1 capsule with snacks. 2 capsules with each meal   diclofenac  Sodium 1 % Gel Commonly known as: VOLTAREN  Apply 1 application  topically 4 (four) times daily as needed (pain).   diphenoxylate-atropine 2.5-0.025 MG tablet Commonly known as: LOMOTIL Take 1 tablet by mouth 2 (two) times daily as needed for diarrhea or loose stools.   DULoxetine  60 MG capsule Commonly known as: CYMBALTA  Take 60 mg by mouth 2 (two) times daily.   esomeprazole 40 MG capsule Commonly known as: NEXIUM Take 40 mg by mouth daily.   HYDROcodone -acetaminophen  5-325 MG tablet Commonly known as: NORCO/VICODIN Take 1 tablet by mouth every 4 (four) hours as needed for up to 5 days for severe pain (pain score 7-10) or moderate pain (pain score 4-6). What changed:  when to take this reasons to take this   Jardiance 10 MG Tabs tablet Generic drug: empagliflozin Take 10 mg by mouth daily.   lidocaine  5 % Commonly known as: LIDODERM  Place 1 patch onto the skin daily as needed (pain).   losartan   50 MG tablet Commonly known as: COZAAR  Take 1 tablet (50 mg total) by mouth daily. What changed:  medication strength how much to take   MAGNESIUM PO Take 1 tablet by mouth daily.   methocarbamol  500 MG tablet Commonly known as: ROBAXIN  Take 1 tablet (500 mg total) by mouth every 8 (eight) hours as needed for muscle spasms.   Mounjaro 7.5 MG/0.5ML Pen Generic drug: tirzepatide Inject 7.5 mg into the skin once a week. What changed: Another medication with the same name was removed. Continue taking this  medication, and follow the directions you see here.   mupirocin  ointment 2 % Commonly known as: BACTROBAN  Place 1 Application into the nose 2 (two) times daily for 60 doses. Use as directed 2 times daily for 5 days every other week for 6 weeks.   OMEGA 3 FISH OIL PO Take 1 capsule by mouth daily at 12 noon.   ondansetron  4 MG tablet Commonly known as: ZOFRAN  Take 4 mg by mouth every 8 (eight) hours as needed for nausea or vomiting.   potassium chloride 10 MEQ tablet Commonly known as: KLOR-CON M Take 10 mEq by mouth daily.   rivaroxaban  20 MG Tabs tablet Commonly known as: XARELTO  Take 20 mg by mouth daily with supper.   rosuvastatin  40 MG tablet Commonly known as: CRESTOR  Take 40 mg by mouth at bedtime.   Stiolto Respimat  2.5-2.5 MCG/ACT Aers Generic drug: Tiotropium Bromide-Olodaterol Inhale 2 puffs into the lungs daily. What changed: You were already taking a medication with the same name, and this prescription was added. Make sure you understand how and when to take each.   Stiolto Respimat  2.5-2.5 MCG/ACT Aers Generic drug: Tiotropium Bromide-Olodaterol Inhale 4 g into the lungs daily. What changed: See the new instructions.       Disposition and follow-up:   [name] was discharged from [facility] in Good condition.  At the hospital follow up visit please address:  1.  Given this atypical atraumatic femur fracture, it would be appropiate to consider bisphosphinates and/or updated DEXA as outpatient  2.  Labs / imaging needed at time of follow-up: n/a  3.  Pending labs/ test needing follow-up: n/a  Follow-up Appointments:  Contact information for follow-up providers     Mary Rosina SAILOR, PA. Go on 06/06/2024.   Specialty: Physician Assistant Why: 2:15p Contact information: [address] [place] [address] [phone]         Mary Franky SQUIBB, MD. Schedule an appointment as soon as possible for a visit in 2  week(s).   Specialty: Orthopedic Surgery Why: for wound check Contact information: [address] [place] [address] [phone]              Contact information for after-discharge care     Destination     [facility] .   Service: Skilled Nursing Contact information: [address] [place]  [phone]                    Hospital Course by problem list: [name] is a 69 y.o. person living with a history of hypertension, COPD, type 2 diabetes, prior DVT/PE on anticoagulation, pancreatic insufficiency, breast cancer s/p radiation and anti-estrogen therapy who presented with right leg pain after stepping out from her vehicle and is admitted for comminuted and displaced right femur fracture, now being discharged on hospital day 6 with the following pertinent hospital course:  Atypical atraumatic right  femur fracture Mechanical fall Patient presented with comminuted right femur fracture after stepping out of her vehicle and feeling her leg give out from under her.  Patient's vital signs were stable on admission and x-ray showed comminuted displaced fracture of the distal femoral metaphysis on her right lower extremity.  Orthopedic surgery was consulted and recommended ORIF after brief Xarelto  washout period.  Patient was on nonweightbearing restriction and started on pain regimen of hydrocodone  as needed, Dilaudid  as needed with scheduled Tylenol  and Cymbalta .  Orthopedic surgery performed open reduction internal fixation of her right supracondylar distal femur fracture and given perioperative antibiotics for infection prophylaxis.  Procedure went well and patient's pain was controlled throughout her hospitalization.  We resumed her Xarelto  and had physical therapy and occupational therapy evaluate the patient and gave their recommendations of SNF. LCSW gave the patient and her family list of SNF facilities to choose from, and was  accepted to [facility].    Hypertension Patient presented mildly hypertensive in the ED, at home medication's included amlodipine , hydralazine, losartan , and Lasix as needed.  Resumed patient's amlodipine  and losartan  daily, and patient's blood pressures remained normotensive throughout the remainder of her admission.   Normocytic anemia Hemoglobin 11.4 on admission which is stable from August 2025, and recheck was 11.3. An iron panel and vitamin D  level was ordered and returned within normal limits and vitamin D  level of 57.2. Patient's hemoglobin dropped to 9, then 8 following her ORIF which is to be expected, and has remained steady throughout the rest of her admission.  On day of discharge, patient hemoglobin was 8.3, up from 8.2 previous day.   Stable chronic medical conditions: COPD - continued Brovana  two times daily, elipta daily and albuterol  as needed T2DM - continued CBG checks before meals and at bedtime, with sensitive SSI Pancreatic insufficiency - continued Creon  three times daily before meals  Subjective Patient was seen this morning resting comfortably on her side in bed scrolling on her phone.  Patient is excited to have been finally accepted to SNF and has no questions or concerns at this time. She says her pain continues to be well controlled, and denies issues or changes with bowel movements. Patient excited to begin her rehabilitation.   Discharge Exam:   BP 134/71 (BP Location: Right Wrist)   Pulse 75   Temp 98.2 F (36.8 C) (Oral)   Resp 18   Ht 5' 2.5 (1.588 m)   Wt 85.1 kg   SpO2 100%   BMI 33.77 kg/m  Discharge exam:  General: Well-appearing elderly woman resting in bed, not in acute distress Cardio: RRR, no r/m Resp: Normal effort on room air, CTAB MSK: Bilateral lower extremities neurovascularly intact, right knee wrapped in ACE bandage, NTTP  Pertinent Labs, Studies, and Procedures:     Latest Ref Rng & Units 06/04/2024    4:47 AM 06/02/2024    4:22  AM 06/01/2024    5:56 AM  CBC  WBC 4.0 - 10.5 K/uL 5.3  5.2  4.6   Hemoglobin 12.0 - 15.0 g/dL 8.3  8.2  8.4   Hematocrit 36.0 - 46.0 % 24.9  24.1  24.9   Platelets 150 - 400 K/uL 238  168  155        Latest Ref Rng & Units 06/04/2024    4:47 AM 05/31/2024    5:21 AM 05/30/2024    4:32 AM  CMP  Glucose 70 - 99 mg/dL 872  853  883   BUN  8 - 23 mg/dL 11  10  11    Creatinine 0.44 - 1.00 mg/dL 9.36  9.18  9.30   Sodium 135 - 145 mmol/L 142  135  135   Potassium 3.5 - 5.1 mmol/L 3.4  4.2  3.9   Chloride 98 - 111 mmol/L 106  102  101   CO2 22 - 32 mmol/L 29  25  23    Calcium  8.9 - 10.3 mg/dL 9.1  8.8  9.2     DG FEMUR PORT, MIN 2 VIEWS RIGHT Result Date: 05/30/2024 CLINICAL DATA:  Right femoral fracture fixation. EXAM: RIGHT FEMUR PORTABLE 2 VIEW COMPARISON:  05/29/2024 FINDINGS: Evidence of patient's interval fixation of distal right femoral metaphyseal fracture. Lateral fixation plate and screws bridges the fracture site as hardware is intact with normal anatomic alignment over the fracture site. Right knee arthroplasty intact and unchanged. There is diffuse decreased bone mineralization. Mild osteoarthritic change of the right hip. IMPRESSION: Interval fixation of distal right femoral metaphyseal fracture with anatomic alignment. Electronically Signed   By: Toribio Agreste M.D.   On: 05/30/2024 17:04   DG FEMUR, MIN 2 VIEWS RIGHT Result Date: 05/30/2024 CLINICAL DATA:  ORIF right femur. EXAM: RIGHT FEMUR 2 VIEWS COMPARISON:  05/29/2024 FINDINGS: Multiple intraoperative images of the right femur demonstrate fixation of patient's distal metaphyseal fracture with lateral fixation plate and screws intact. Near anatomic alignment over the fracture site. Partial visualization of patient's right knee arthroplasty unchanged. Recommend correlation with findings at the time of the procedure. IMPRESSION: Fixation of patient's distal right femoral metaphyseal fracture as described. Electronically Signed    By: Toribio Agreste M.D.   On: 05/30/2024 17:02   DG C-Arm 1-60 Min-No Report Result Date: 05/30/2024 Fluoroscopy was utilized by the requesting physician.  No radiographic interpretation.   CT Head Wo Contrast Result Date: 05/29/2024 CLINICAL DATA:  Fall.  Patient on blood thinners. EXAM: CT HEAD WITHOUT CONTRAST TECHNIQUE: Contiguous axial images were obtained from the base of the skull through the vertex without intravenous contrast. RADIATION DOSE REDUCTION: This exam was performed according to the departmental dose-optimization program which includes automated exposure control, adjustment of the mA and/or kV according to patient size and/or use of iterative reconstruction technique. COMPARISON:  05/21/2007 FINDINGS: Brain: Ventricles, cisterns and other CSF spaces are normal. There is no mass, mass effect, shift of midline structures or acute hemorrhage. No acute infarction. Vascular: No hyperdense vessel or unexpected calcification. Skull: Normal. Negative for fracture or focal lesion. Sinuses/Orbits: No acute finding. Other: None. IMPRESSION: No acute findings. Electronically Signed   By: Toribio Agreste M.D.   On: 05/29/2024 11:43   DG Tibia/Fibula Right Result Date: 05/29/2024 CLINICAL DATA:  Fall.  Right leg pain. EXAM: RIGHT TIBIA AND FIBULA - 2 VIEW COMPARISON:  None Available. FINDINGS: There is no evidence of fracture or other focal bone lesions. Moderate ankle osteoarthritis noted. Soft tissues are unremarkable. IMPRESSION: No acute findings. Moderate ankle osteoarthritis. Electronically Signed   By: Norleen DELENA Kil M.D.   On: 05/29/2024 11:32   DG FEMUR, MIN 2 VIEWS RIGHT Result Date: 05/29/2024 CLINICAL DATA:  Fall.  Distal femur pain and deformity. EXAM: RIGHT FEMUR 2 VIEWS COMPARISON:  None Available. FINDINGS: Comminuted displaced fracture of the distal femoral metaphysis is seen just above the femoral component of the knee arthroplasty. Moderate lateral and posterior displacement and  angulation of the distal fracture fragment is seen. IMPRESSION: Comminuted displaced fracture of the distal femoral metaphysis, just above the femoral component of  the knee arthroplasty. Electronically Signed   By: Norleen DELENA Kil M.D.   On: 05/29/2024 11:31   DG Pelvis 1-2 Views Result Date: 05/29/2024 CLINICAL DATA:  Fall.  Pelvic pain. EXAM: PELVIS - 1 VIEW COMPARISON:  None Available. FINDINGS: There is no evidence of pelvic fracture or diastasis. Bilateral sacroiliac degenerative changes are noted. Moderate bilateral hip osteoarthritis is seen. Advanced lower lumbar spine degenerative changes also noted. IMPRESSION: No evidence of pelvic fracture or other acute findings. Electronically Signed   By: Norleen DELENA Kil M.D.   On: 05/29/2024 11:29   Signed: Idelle Nakai, DO 06/04/2024, 10:12 AM   "

## 2024-06-03 NOTE — Progress Notes (Signed)
 "  HD#5 SUBJECTIVE:  Patient Summary: [name] is a 69 y.o. with a pertinent PMH of hypertension, COPD, type 2 diabetes, prior DVT/PE on anticoagulation, pancreatic insufficiency, breast cancer s/p radiation and anti-estrogen therapy who presented with right leg pain after stepping out from her vehicle and is admitted for comminuted and displaced right femur fracture.  Overnight Events: none.  Interim History: Patient was seen resting comfortably in her bed morning, denies chest pain or SOB.  Patient continues to endorse well-controlled pain, and only pain that she does experience is coming from the site of her surgery, not having pains anywhere else.  Patient says that her last bowel movement was yesterday, and she denies any issue with this or new concerns.  LCSW gave patient a list of SNF options to choose from, and once patient makes SNF decision, LCSW will begin insurance authorization.  OBJECTIVE:  Vital Signs: Vitals:   06/03/24 0351 06/03/24 0500 06/03/24 0804 06/03/24 0951  BP: (!) 136/55  (!) 139/53 (!) 139/53  Pulse: 70  70   Resp: 20  18   Temp: 98 F (36.7 C)  99.2 F (37.3 C)   TempSrc:   Oral   SpO2: 99%  100%   Weight:  85.1 kg    Height:       Supplemental O2: Room Air SpO2: 100 %  Filed Weights   06/01/24 0500 06/02/24 0428 06/03/24 0500  Weight: 84.4 kg 85 kg 85.1 kg    Intake/Output Summary (Last 24 hours) at 06/03/2024 1052 Last data filed at 06/03/2024 0900 Gross per 24 hour  Intake 1200 ml  Output --  Net 1200 ml   Net IO Since Admission: -826.66 mL [06/03/24 1052]  Physical Exam: Physical Exam Constitutional:      Appearance: She is not ill-appearing, toxic-appearing or diaphoretic.  Cardiovascular:     Rate and Rhythm: Normal rate and regular rhythm.     Heart sounds: Normal heart sounds.  Pulmonary:     Effort: Pulmonary effort is normal. No respiratory distress.     Breath sounds: Normal breath sounds.  Musculoskeletal:      Comments: Warm bilat LE, neurovascularly intact - R LE knee thoroughly ACE wrapped, non-TTP  Skin:    General: Skin is warm.  Neurological:     Mental Status: She is alert.    Patient Lines/Drains/Airways Status     Active Line/Drains/Airways     Name Placement date Placement time Site Days   Wound 05/30/24 1509 Surgical Closed Surgical Incision Leg Right 05/30/24  1509  Leg  4           Pertinent labs and imaging:     Latest Ref Rng & Units 06/02/2024    4:22 AM 06/01/2024    5:56 AM 05/31/2024    5:21 AM  CBC  WBC 4.0 - 10.5 K/uL 5.2  4.6  5.5   Hemoglobin 12.0 - 15.0 g/dL 8.2  8.4  9.6   Hematocrit 36.0 - 46.0 % 24.1  24.9  27.6   Platelets 150 - 400 K/uL 168  155  155        Latest Ref Rng & Units 05/31/2024    5:21 AM 05/30/2024    4:32 AM 05/29/2024   10:15 AM  CMP  Glucose 70 - 99 mg/dL 853  883  897   BUN 8 - 23 mg/dL 10  11  15    Creatinine 0.44 - 1.00 mg/dL 9.18  9.30  9.14   Sodium  135 - 145 mmol/L 135  135  138   Potassium 3.5 - 5.1 mmol/L 4.2  3.9  4.1   Chloride 98 - 111 mmol/L 102  101  104   CO2 22 - 32 mmol/L 25  23  25    Calcium  8.9 - 10.3 mg/dL 8.8  9.2  9.0    No results found.   ASSESSMENT/PLAN:  Assessment: Principal Problem:   Femur fracture (HCC) Active Problems:   COPD with chronic bronchitis and emphysema (HCC)   Osteoarthritis   Hypertension   DM (diabetes mellitus) type 2, uncontrolled, with ketoacidosis (HCC)   Current long-term use of anticoagulant medication with history of deep venous thrombosis (DVT)   Normocytic anemia   Fall  [name] is a 69 y.o. female with pertinent PMH of hypertension, COPD, type 2 diabetes, prior DVT/PE on anticoagulation, pancreatic insufficiency, breast cancer s/p radiation and anti-estrogen therapy who presented with right leg pain after stepping out from her vehicle and is admitted for comminuted and displaced right femur fracture.   Plan: Atypical, atraumatic right femur fracture,  comminuted displaced s/p ORIF postop day 4 Mechanical fall POD #4, doing well. Endorses adequate pain control. PT/OT recommend SNF; patient agreeable. Social work following, gave patient list of SNF options and is in the process of selecting a facility. - continue hydrocodone /acetaminophen  5/325 mg every 4 hours as needed - continue hydromorphone  0.5mg  IV every 3 hours as needed - Tylenol  650 mg every 8 hours, Voltaren  gel, duloxetine  60 mg twice daily - weightbearing as tolerated to the right lower extremity per ortho   Hypertension Continues to remain normotensive, on amlodipine  10 mg daily and losartan  50 mg daily. -Continue treatment as above - Will get a BMP tomorrow  Hx of unprovoked DVT - Continue xarelto  20 mg daily   Normocytic anemia Hb stable at 8.2, will continue to monitor though no acute concerns at this time. - CBC tomorrow  Stable Chronic Problems: pancreatic insufficiency - continue Creon  three times daily before meals COPD - continue Brovana  two times daily, ellipta daily, and albuterol  as needed T2DM - continue sensitive SSI  Best Practice: Diet: carb modified IVF: Fluids: LR, Rate: None VTE: SCDs Start: 05/30/24 1634 Code: Full  Disposition planning: Therapy Recs: SNF Family Contact: grandson and son, at bedside. DISPO: Anticipated discharge pending SNF placement  Signature: Alfornia Light, DO [facility]  10:52 AM, 06/03/2024  On Call pager [phone]  "

## 2024-06-03 NOTE — TOC Progression Note (Addendum)
 Transition of Care [facility]) - Progression Note    Patient Details  Name: HETAL PROANO MRN: [id number] Date of Birth: Dec 01, 1955  Transition of Care [facility]) CM/SW Contact  Luann SHAUNNA Cumming, [place] Phone Number: 06/03/2024, 9:40 AM  Clinical Narrative:     Spoke with pt over the phone; provided SNF bed offers and medicare star ratings. Will follow up with pt for SNF choice. Auth will be needed once choice is made.  1210: Provided pt's son Lamar SNF bed offer list with medicare star ratings via text. He expressed concern that there were no 4 and 5 star facilities on list. CSW explained that referrals were set the facilities in [facility] and [place] as well as other nearby areas and the list includes the facilities that could offer a bed. Offered to check into any specific SNF's they would be interested; awaiting response.   1420: Spoke with pt regarding SNF choice. She chooses [facility]. CSW confirmed bed with [facility]. SNF auth request initiated in online portal. Auth# [id number] Auth is pending  Expected Discharge Plan: Skilled Nursing Facility Barriers to Discharge: Continued Medical Work up, SNF Pending bed offer               Expected Discharge Plan and Services                                               Social Drivers of Health (SDOH) Interventions SDOH Screenings   Food Insecurity: No Food Insecurity (05/29/2024)  Housing: Low Risk (05/29/2024)  Transportation Needs: No Transportation Needs (05/29/2024)  Utilities: Not At Risk (05/29/2024)  Depression (PHQ2-9): Low Risk (05/29/2024)  Social Connections: Unknown (05/29/2024)  Tobacco Use: High Risk (05/30/2024)    Readmission Risk Interventions     No data to display

## 2024-06-03 NOTE — Plan of Care (Signed)

## 2024-06-03 NOTE — Plan of Care (Signed)
  Problem: Activity: Goal: Risk for activity intolerance will decrease Outcome: Progressing   Problem: Elimination: Goal: Will not experience complications related to bowel motility Outcome: Progressing   Problem: Pain Managment: Goal: General experience of comfort will improve and/or be controlled Outcome: Progressing

## 2024-06-03 NOTE — Telephone Encounter (Signed)
 Pharmacy Patient Advocate Encounter  Insurance verification completed.    The patient is insured through [facility]. Patient has Medicare and is not eligible for a copay card, but may be able to apply for patient assistance or Medicare RX Payment Plan (Patient Must reach out to their plan, if eligible for payment plan), if available.    Ran test claim for Xarelto  20mg  tablet and the current 30 day co-pay is $4.90.   This test claim was processed through [facility]- copay amounts may vary at other pharmacies due to pharmacy/plan contracts, or as the patient moves through the different stages of their insurance plan.

## 2024-06-03 NOTE — TOC CAGE-AID Note (Signed)
 Transition of Care [facility]) - CAGE-AID Screening   Patient Details  Name: [name] MRN: [id number] Date of Birth: Jan 20, 1956  Transition of Care [facility]) CM/SW Contact:    LEBRON ROCKIE ORN, RN Phone Number: [phone] 06/03/2024, 10:57 AM   Clinical Narrative: Pt reports that she smokes approx 1/2 ppd of cigarettes. Pt also admits to drinking occasional alcohol but denies current drug use. Pt has prior hx of marijuana and crack use but denies this to be current. Will print resources on AVS.  Screening complete.    CAGE-AID Screening:    Have You Ever Felt You Ought to Cut Down on Your Drinking or Drug Use?: No Have People Annoyed You By Critizing Your Drinking Or Drug Use?: No Have You Felt Bad Or Guilty About Your Drinking Or Drug Use?: No Have You Ever Had a Drink or Used Drugs First Thing In The Morning to Steady Your Nerves or to Get Rid of a Hangover?: No CAGE-AID Score: 0  Substance Abuse Education Offered: Yes  Substance abuse interventions: Patient Counseling, Educational Materials (smoking cessation to be printed on AVS)

## 2024-06-03 NOTE — Progress Notes (Signed)
 Occupational Therapy Treatment Patient Details Name: [name] MRN: [id number] DOB: Aug 02, 1955 Today's Date: 06/03/2024   History of present illness Pt is a 69 y.o. F presenting [facility] following fall. X-ray shows comminuted displaced fx of distal femoral metaphysis on the R. Pt s/p ORIF on 1/22/6. PMH is significant for COPD, T2DM, breast CA, and HTN.   OT comments  Pt making progress with functional goals. Pt in bed upon arrival, pleasant and cooperative. Session focused on sitting EOB, LB selfcare, STS/mobility using RW and commode transfers/toileting tasks. OT wil continue to follow acutely to maximize level of function and safety      If plan is discharge home, recommend the following:  A little help with walking and/or transfers;A little help with bathing/dressing/bathroom;Assistance with cooking/housework;Assist for transportation;Help with stairs or ramp for entrance   Equipment Recommendations  Other (comment) (defer)    Recommendations for Other Services      Precautions / Restrictions Precautions Precautions: Fall Recall of Precautions/Restrictions: Intact Restrictions Weight Bearing Restrictions Per Provider Order: Yes RLE Weight Bearing Per Provider Order: Weight bearing as tolerated       Mobility Bed Mobility Overal bed mobility: Needs Assistance Bed Mobility: Supine to Sit, Sit to Supine     Supine to sit: Contact guard Sit to supine: Contact guard assist   General bed mobility comments: no physical assist, able to assist R LE off EOB using  L LE    Transfers Overall transfer level: Needs assistance Equipment used: Rolling walker (2 wheels) Transfers: Sit to/from Stand   Stand pivot transfers: Contact guard assist   Step pivot transfers: Contact guard assist           Balance Overall balance assessment: Needs assistance Sitting-balance support: Single extremity supported, Feet supported Sitting balance-Leahy Scale: Good      Standing balance support: Bilateral upper extremity supported, Reliant on assistive device for balance, During functional activity Standing balance-Leahy Scale: Poor                             ADL either performed or assessed with clinical judgement   ADL Overall ADL's : Needs assistance/impaired                 Upper Body Dressing : Set up;Sitting   Lower Body Dressing: Minimal assistance;Sitting/lateral leans   Toilet Transfer: Contact guard assist;Rolling walker (2 wheels);Ambulation;BSC/3in1   Toileting- Clothing Manipulation and Hygiene: Contact guard assist;Supervision/safety;Sitting/lateral lean;Sit to/from stand;Cueing for safety       Functional mobility during ADLs: Contact guard assist;Rolling walker (2 wheels);Cueing for safety      Extremity/Trunk Assessment Upper Extremity Assessment Upper Extremity Assessment: Generalized weakness;Overall WFL for tasks assessed   Lower Extremity Assessment Lower Extremity Assessment: Defer to PT evaluation   Cervical / Trunk Assessment Cervical / Trunk Assessment: Normal    Vision Ability to See in Adequate Light: 0 Adequate Patient Visual Report: No change from baseline     Perception     Praxis     Communication Communication Communication: No apparent difficulties   Cognition Arousal: Alert Behavior During Therapy: WFL for tasks assessed/performed Cognition: No apparent impairments                               Following commands: Intact        Cueing   Cueing Techniques: Verbal cues  Exercises  Shoulder Instructions       General Comments      Pertinent Vitals/ Pain       Pain Assessment Pain Assessment: Faces Faces Pain Scale: Hurts little more Pain Location: R leg Pain Descriptors / Indicators: Aching, Grimacing, Discomfort Pain Intervention(s): Monitored during session, Repositioned  Home Living                                           Prior Functioning/Environment              Frequency  Min 2X/week        Progress Toward Goals  OT Goals(current goals can now be found in the care plan section)  Progress towards OT goals: Progressing toward goals     Plan      Co-evaluation                 AM-PAC OT 6 Clicks Daily Activity     Outcome Measure   Help from another person eating meals?: None Help from another person taking care of personal grooming?: A Little Help from another person toileting, which includes using toliet, bedpan, or urinal?: A Little Help from another person bathing (including washing, rinsing, drying)?: A Little Help from another person to put on and taking off regular upper body clothing?: None Help from another person to put on and taking off regular lower body clothing?: A Little 6 Click Score: 20    End of Session Equipment Utilized During Treatment: Gait belt;Rolling walker (2 wheels);Other (comment) (3 in 1)  OT Visit Diagnosis: Unsteadiness on feet (R26.81);Other abnormalities of gait and mobility (R26.89);Muscle weakness (generalized) (M62.81);Pain Pain - Right/Left: Right Pain - part of body: Leg   Activity Tolerance Patient tolerated treatment well   Patient Left in bed;with call bell/phone within reach;with bed alarm set   Nurse Communication Mobility status        Time: 8791-8766 OT Time Calculation (min): 25 min  Charges: OT General Charges $OT Visit: 1 Visit OT Treatments $Self Care/Home Management : 8-22 mins $Therapeutic Activity: 8-22 mins   Jacques Karna Loose 06/03/2024, 2:36 PM

## 2024-06-04 ENCOUNTER — Other Ambulatory Visit (HOSPITAL_COMMUNITY): Payer: Self-pay

## 2024-06-04 DIAGNOSIS — J449 Chronic obstructive pulmonary disease, unspecified: Secondary | ICD-10-CM | POA: Diagnosis not present

## 2024-06-04 DIAGNOSIS — I10 Essential (primary) hypertension: Secondary | ICD-10-CM | POA: Diagnosis not present

## 2024-06-04 DIAGNOSIS — D649 Anemia, unspecified: Secondary | ICD-10-CM | POA: Diagnosis not present

## 2024-06-04 DIAGNOSIS — E119 Type 2 diabetes mellitus without complications: Secondary | ICD-10-CM | POA: Diagnosis not present

## 2024-06-04 DIAGNOSIS — M84751A Incomplete atypical femoral fracture, right leg, initial encounter for fracture: Secondary | ICD-10-CM

## 2024-06-04 DIAGNOSIS — K8689 Other specified diseases of pancreas: Secondary | ICD-10-CM | POA: Diagnosis not present

## 2024-06-04 DIAGNOSIS — W19XXXA Unspecified fall, initial encounter: Secondary | ICD-10-CM | POA: Diagnosis not present

## 2024-06-04 LAB — BASIC METABOLIC PANEL WITH GFR
Anion gap: 8 (ref 5–15)
BUN: 11 mg/dL (ref 8–23)
CO2: 29 mmol/L (ref 22–32)
Calcium: 9.1 mg/dL (ref 8.9–10.3)
Chloride: 106 mmol/L (ref 98–111)
Creatinine, Ser: 0.63 mg/dL (ref 0.44–1.00)
GFR, Estimated: >60 mL/min
Glucose, Bld: 127 mg/dL — ABNORMAL HIGH (ref 70–99)
Potassium: 3.4 mmol/L — ABNORMAL LOW (ref 3.5–5.1)
Sodium: 142 mmol/L (ref 135–145)

## 2024-06-04 LAB — GLUCOSE, CAPILLARY
Glucose-Capillary: 116 mg/dL — ABNORMAL HIGH (ref 70–99)
Glucose-Capillary: 102 mg/dL — ABNORMAL HIGH (ref 70–99)

## 2024-06-04 LAB — CBC
HCT: 24.9 % — ABNORMAL LOW (ref 36.0–46.0)
Hemoglobin: 8.3 g/dL — ABNORMAL LOW (ref 12.0–15.0)
MCH: 31 pg (ref 26.0–34.0)
MCHC: 33.3 g/dL (ref 30.0–36.0)
MCV: 92.9 fL (ref 80.0–100.0)
Platelets: 238 10*3/uL (ref 150–400)
RBC: 2.68 MIL/uL — ABNORMAL LOW (ref 3.87–5.11)
RDW: 14.1 % (ref 11.5–15.5)
WBC: 5.3 10*3/uL (ref 4.0–10.5)
nRBC: 0 % (ref 0.0–0.2)

## 2024-06-04 MED ORDER — STIOLTO RESPIMAT 2.5-2.5 MCG/ACT IN AERS
4.0000 g | INHALATION_SPRAY | Freq: Every day | RESPIRATORY_TRACT | 0 refills | Status: AC
  Filled 2024-06-04: qty 4, fill #0

## 2024-06-04 MED ORDER — STIOLTO RESPIMAT 2.5-2.5 MCG/ACT IN AERS
2.0000 | INHALATION_SPRAY | Freq: Every day | RESPIRATORY_TRACT | 0 refills | Status: AC
  Filled 2024-06-04: qty 4, fill #0
  Filled 2024-06-04: qty 4, 30d supply, fill #0

## 2024-06-04 MED ORDER — LOSARTAN POTASSIUM 50 MG PO TABS: 50.0000 mg | ORAL_TABLET | Freq: Every day | ORAL | Status: AC

## 2024-06-04 MED ORDER — METHOCARBAMOL 500 MG PO TABS: 500.0000 mg | ORAL_TABLET | Freq: Three times a day (TID) | ORAL | Status: AC | PRN

## 2024-06-04 MED ORDER — HYDROCODONE-ACETAMINOPHEN 5-325 MG PO TABS: 1.0000 | ORAL_TABLET | ORAL | 0 refills | Status: AC | PRN

## 2024-06-04 NOTE — TOC Transition Note (Addendum)
 Transition of Care [facility]) - Discharge Note   Patient Details  Name: [name] MRN: [id number] Date of Birth: Aug 25, 1955  Transition [facility] Care [facility]) CM/SW Contact:  Luann SHAUNNA Cumming, LCSW Phone Number: 06/04/2024, 12:13 PM   Clinical Narrative:      Per MD patient ready for DC to [facility]. RN, patient, and facility notified of DC. Discharge Summary and FL2 sent to facility. RN to call report prior to discharge [phone]). DC packet on chart. Ambulance transport requested for patient.   CSW will sign off for now as social work intervention is no longer needed. Please consult us  again if new needs arise.   Final next level of care: Skilled Nursing Facility Barriers to Discharge: No Barriers Identified     Discharge Placement              Patient chooses bed at:  [facility]) Patient to be transferred to facility by: [facility]   Patient and family notified of of transfer: 06/04/24  Discharge Plan and Services Additional resources added to the After Visit Summary for                                       Social Drivers of Health (SDOH) Interventions SDOH Screenings   Food Insecurity: No Food Insecurity (05/29/2024)  Housing: Low Risk (05/29/2024)  Transportation Needs: No Transportation Needs (05/29/2024)  Utilities: Not At Risk (05/29/2024)  Depression (PHQ2-9): Low Risk (05/29/2024)  Social Connections: Unknown (05/29/2024)  Tobacco Use: High Risk (05/30/2024)     Readmission Risk Interventions     No data to display

## 2024-06-04 NOTE — Plan of Care (Signed)
" °  Problem: Education: Goal: Ability to describe self-care measures that may prevent or decrease complications (Diabetes Survival Skills Education) will improve Outcome: Progressing   Problem: Coping: Goal: Ability to adjust to condition or change in health will improve Outcome: Progressing   Problem: Fluid Volume: Goal: Ability to maintain a balanced intake and output will improve Outcome: Progressing   Problem: Health Behavior/Discharge Planning: Goal: Ability to identify and utilize available resources and services will improve Outcome: Progressing Goal: Ability to manage health-related needs will improve Outcome: Progressing   Problem: Metabolic: Goal: Ability to maintain appropriate glucose levels will improve Outcome: Progressing   Problem: Nutritional: Goal: Maintenance of adequate nutrition will improve Outcome: Progressing   Problem: Skin Integrity: Goal: Risk for impaired skin integrity will decrease Outcome: Progressing   Problem: Education: Goal: Knowledge of General Education information will improve Description: Including pain rating scale, medication(s)/side effects and non-pharmacologic comfort measures Outcome: Progressing   Problem: Health Behavior/Discharge Planning: Goal: Ability to manage health-related needs will improve Outcome: Progressing   Problem: Nutritional: Goal: Progress toward achieving an optimal weight will improve Outcome: Not Progressing   "

## 2024-06-04 NOTE — Plan of Care (Signed)
  Problem: Education: Goal: Knowledge of General Education information will improve Description: Including pain rating scale, medication(s)/side effects and non-pharmacologic comfort measures Outcome: Progressing   Problem: Pain Managment: Goal: General experience of comfort will improve and/or be controlled Outcome: Progressing

## 2024-06-04 NOTE — Discharge Instructions (Addendum)
 "  [facility] Discharge Instructions   General Discharge Instructions  WEIGHT BEARING STATUS:Weightbearing as tolerated  RANGE OF MOTION/ACTIVITY: ok for motion as tolerated  Wound Care: You may remove your surgical dressings. Incisions can be left open to air if there is no drainage. Once the incision is completely dry and without drainage, it may be left open to air out.  Showering may begin post op day #3.  Clean incision gently with soap and water .  DVT/PE prophylaxis: Xarelto   Diet: as you were eating previously.  Can use over the counter stool softeners and bowel preparations, such as Miralax , to help with bowel movements.  Narcotics can be constipating.  Be sure to drink plenty of fluids  PAIN MEDICATION USE AND EXPECTATIONS  You have likely been given narcotic medications to help control your pain.  After a traumatic event that results in an fracture (broken bone) with or without surgery, it is ok to use narcotic pain medications to help control one's pain.  We understand that everyone responds to pain differently and each individual patient will be evaluated on a regular basis for the continued need for narcotic medications. Ideally, narcotic medication use should last no more than 6-8 weeks (coinciding with fracture healing).   As a patient it is your responsibility as well to monitor narcotic medication use and report the amount and frequency you use these medications when you come to your office visit.   We would also advise that if you are using narcotic medications, you should take a dose prior to therapy to maximize you participation.  IF YOU ARE ON NARCOTIC MEDICATIONS IT IS NOT PERMISSIBLE TO OPERATE A MOTOR VEHICLE (MOTORCYCLE/CAR/TRUCK/MOPED) OR HEAVY MACHINERY DO NOT MIX NARCOTICS WITH OTHER CNS (CENTRAL NERVOUS SYSTEM) DEPRESSANTS SUCH AS ALCOHOL  POST-OPERATIVE OPIOID TAPER INSTRUCTIONS: It is important to wean off of your opioid medication as soon as  possible. If you do not need pain medication after your surgery it is ok to stop day one. Opioids include: Codeine, Hydrocodone (Norco, Vicodin), Oxycodone (Percocet, oxycontin ) and hydromorphone  amongst others.  Long term and even short term use of opiods can cause: Increased pain response Dependence Constipation Depression Respiratory depression And more.  Withdrawal symptoms can include Flu like symptoms Nausea, vomiting And more Techniques to manage these symptoms Hydrate well Eat regular healthy meals Stay active Use relaxation techniques(deep breathing, meditating, yoga) Do Not substitute Alcohol to help with tapering If you have been on opioids for less than two weeks and do not have pain than it is ok to stop all together.  Plan to wean off of opioids This plan should start within one week post op of your fracture surgery  Maintain the same interval or time between taking each dose and first decrease the dose.  Cut the total daily intake of opioids by one tablet each day Next start to increase the time between doses. The last dose that should be eliminated is the evening dose.    STOP SMOKING OR USING NICOTINE  PRODUCTS!!!!  As discussed nicotine  severely impairs your body's ability to heal surgical and traumatic wounds but also impairs bone healing.  Wounds and bone heal by forming microscopic blood vessels (angiogenesis) and nicotine  is a vasoconstrictor (essentially, shrinks blood vessels).  Therefore, if vasoconstriction occurs to these microscopic blood vessels they essentially disappear and are unable to deliver necessary nutrients to the healing tissue.  This is one modifiable factor that you can do to dramatically increase your chances of healing your injury.  (  This means no smoking, no nicotine  gum, patches, etc)  DO NOT USE NONSTEROIDAL ANTI-INFLAMMATORY DRUGS (NSAID'S)  Using products such as Advil (ibuprofen), Aleve (naproxen), Motrin (ibuprofen) for additional pain  control during fracture healing can delay and/or prevent the healing response.  If you would like to take over the counter (OTC) medication, Tylenol  (acetaminophen ) is ok.  However, some narcotic medications that are given for pain control contain acetaminophen  as well. Therefore, you should not exceed more than 4000 mg of tylenol  in a day if you do not have liver disease.  Also note that there are may OTC medicines, such as cold medicines and allergy medicines that my contain tylenol  as well.  If you have any questions about medications and/or interactions please ask your doctor/PA or your pharmacist.      ICE AND ELEVATE INJURED/OPERATIVE EXTREMITY  Using ice and elevating the injured extremity above your heart can help with swelling and pain control.  Icing in a pulsatile fashion, such as 20 minutes on and 20 minutes off, can be followed.    Do not place ice directly on skin. Make sure there is a barrier between to skin and the ice pack.    Using frozen items such as frozen peas works well as the conform nicely to the are that needs to be iced.  USE AN ACE WRAP OR TED HOSE FOR SWELLING CONTROL  In addition to icing and elevation, Ace wraps or TED hose are used to help limit and resolve swelling.  It is recommended to use Ace wraps or TED hose until you are informed to stop.    When using Ace Wraps start the wrapping distally (farthest away from the body) and wrap proximally (closer to the body)   Example: If you had surgery on your leg or thing and you do not have a splint on, start the ace wrap at the toes and work your way up to the thigh        If you had surgery on your upper extremity and do not have a splint on, start the ace wrap at your fingers and work your way up to the upper arm   CALL THE OFFICE FOR MEDICATION REFILLS OR WITH ANY QUESTIONS/CONCERNS: [phone]   VISIT OUR WEBSITE FOR ADDITIONAL INFORMATION: [email or web address]   Discharge Wound Care Instructions  Do NOT apply any  ointments, solutions or lotions to pin sites or surgical wounds.  These prevent needed drainage and even though solutions like hydrogen peroxide kill bacteria, they also damage cells lining the pin sites that help fight infection.  Applying lotions or ointments can keep the wounds moist and can cause them to breakdown and open up as well. This can increase the risk for infection. When in doubt call the office.  Surgical incisions should be dressed daily.  If any drainage is noted, use one layer of adaptic or Mepitel, then gauze, Kerlix, and an ace wrap. - These dressing supplies should be available at local medical supply stores ([facility], [facility], etc) as well as insurance claims handler ([facility], [facility], [place], etc)  Once the incision is completely dry and without drainage, it may be left open to air out.  Showering may begin 36-48 hours later.  Cleaning gently with soap and water .    Call office for the following: Temperature greater than 101F Persistent nausea and vomiting Severe uncontrolled pain Redness, tenderness, or signs of infection (pain, swelling, redness, odor or green/yellow discharge around the site) Difficulty breathing, headache or visual  disturbances Hives Persistent dizziness or light-headedness Extreme fatigue Any other questions or concerns you may have after discharge  In an emergency, call 911 or go to an Emergency Department at a nearby hospital  OTHER HELPFUL INFORMATION  If you had a block, it will wear off between 8-24 hrs postop typically.  This is period when your pain may go from nearly zero to the pain you would have had postop without the block.  This is an abrupt transition but nothing dangerous is happening.  You may take an extra dose of narcotic when this happens.  You should wean off your narcotic medicines as soon as you are able.  Most patients will be off or using minimal narcotics before their first postop appointment.   We suggest you use  the pain medication the first night prior to going to bed, in order to ease any pain when the anesthesia wears off. You should avoid taking pain medications on an empty stomach as it will make you nauseous.  Do not drink alcoholic beverages or take illicit drugs when taking pain medications.  In most states it is against the law to drive while you are in a splint or sling.  And certainly against the law to drive while taking narcotics.  You may return to work/school in the next couple of days when you feel up to it.   Pain medication may make you constipated.  Below are a few solutions to try in this order: Decrease the amount of pain medication if you arent having pain. Drink lots of decaffeinated fluids. Drink prune juice and/or each dried prunes  If the first 3 dont work start with additional solutions Take Colace - an over-the-counter stool softener Take Senokot - an over-the-counter laxative Take Miralax  - a stronger over-the-counter laxative      Information on my medicine - XARELTO  (rivaroxaban )  This medication education was reviewed with me or my healthcare representative as part of my discharge preparation.    WHY WAS XARELTO  PRESCRIBED FOR YOU? Xarelto  was prescribed to treat blood clots that may have been found in the veins of your legs (deep vein thrombosis) or in your lungs (pulmonary embolism) and to reduce the risk of them occurring again.  What do you need to know about Xarelto ? Continue Xarelto  20 mg tablet taken ONCE A DAY with your evening meal.  DO NOT stop taking Xarelto  without talking to the health care provider who prescribed the medication.  Refill your prescription for 20 mg tablets before you run out.  After discharge, you should have regular check-up appointments with your healthcare provider that is prescribing your Xarelto .  In the future your dose may need to be changed if your kidney function changes by a significant amount.  What do you do  if you miss a dose? If you are taking Xarelto  TWICE DAILY and you miss a dose, take it as soon as you remember. You may take two 15 mg tablets (total 30 mg) at the same time then resume your regularly scheduled 15 mg twice daily the next day.  If you are taking Xarelto  ONCE DAILY and you miss a dose, take it as soon as you remember on the same day then continue your regularly scheduled once daily regimen the next day. Do not take two doses of Xarelto  at the same time.   Important Safety Information Xarelto  is a blood thinner medicine that can cause bleeding. You should call your healthcare provider right away if you experience  any of the following: Bleeding from an injury or your nose that does not stop. Unusual colored urine (red or dark brown) or unusual colored stools (red or black). Unusual bruising for unknown reasons. A serious fall or if you hit your head (even if there is no bleeding).  Some medicines may interact with Xarelto  and might increase your risk of bleeding while on Xarelto . To help avoid this, consult your healthcare provider or pharmacist prior to using any new prescription or non-prescription medications, including herbals, vitamins, non-steroidal anti-inflammatory drugs (NSAIDs) and supplements.  This website has more information on Xarelto : [email or web address] .[email or web address].   Thank you for allowing us  to be part of your care. You were hospitalized for a right femur fracture. We treated you with ORIF surgical procedure, pain management and physical/occupational therapy  FOLLOW UP APPOINTMENTS: Please make sure to follow up with your PCP after your rehab  Please make sure to follow up with orthopedic surgeon, Dr. Kendal, in 2 weeks.  Please call your PCP or our clinic if you have any questions or concerns, we may be able to help and keep you from a long and expensive emergency room wait. Our clinic and after hours phone number is [phone]. The best time to call is Monday  through Friday 9 am to 4 pm but there is always someone available 24/7 if you have an emergency. If you need medication refills please notify your pharmacy one week in advance and they will send us  a request.   We are glad you are feeling better,  Alfornia Light, DO Internal Medicine Inpatient Teaching Service at [facility]  "

## 2024-06-07 ENCOUNTER — Encounter (HOSPITAL_COMMUNITY): Payer: Self-pay | Admitting: Student

## 2024-06-13 ENCOUNTER — Other Ambulatory Visit (HOSPITAL_BASED_OUTPATIENT_CLINIC_OR_DEPARTMENT_OTHER): Payer: Self-pay

## 2024-06-13 DIAGNOSIS — Z13820 Encounter for screening for osteoporosis: Secondary | ICD-10-CM
